# Patient Record
Sex: Female | Born: 1950 | Race: White | Hispanic: No | Marital: Married | Smoking: Never smoker
Health system: Southern US, Community
[De-identification: ages and names within clinical notes are randomized; demographics above are authoritative.]

## PROBLEM LIST (undated history)

## (undated) DIAGNOSIS — F32A Depression, unspecified: Secondary | ICD-10-CM

## (undated) DIAGNOSIS — G4730 Sleep apnea, unspecified: Secondary | ICD-10-CM

## (undated) DIAGNOSIS — E785 Hyperlipidemia, unspecified: Secondary | ICD-10-CM

## (undated) DIAGNOSIS — E119 Type 2 diabetes mellitus without complications: Secondary | ICD-10-CM

## (undated) DIAGNOSIS — C801 Malignant (primary) neoplasm, unspecified: Secondary | ICD-10-CM

## (undated) HISTORY — PX: TRIGGER FINGER RELEASE: SHX641

## (undated) HISTORY — DX: Type 2 diabetes mellitus without complications: E11.9

## (undated) HISTORY — PX: ABDOMINAL HYSTERECTOMY: SHX81

## (undated) HISTORY — DX: Hyperlipidemia, unspecified: E78.5

## (undated) HISTORY — PX: BREAST SURGERY: SHX581

## (undated) HISTORY — DX: Depression, unspecified: F32.A

## (undated) MED FILL — Pertuzumab Soln for IV Infusion 420 MG/14ML (30 MG/ML): INTRAVENOUS | Qty: 14 | Status: AC

---

## 2002-11-03 DIAGNOSIS — C50512 Malignant neoplasm of lower-outer quadrant of left female breast: Secondary | ICD-10-CM | POA: Insufficient documentation

## 2002-11-03 DIAGNOSIS — C50919 Malignant neoplasm of unspecified site of unspecified female breast: Secondary | ICD-10-CM | POA: Insufficient documentation

## 2002-11-03 DIAGNOSIS — Z170 Estrogen receptor positive status [ER+]: Secondary | ICD-10-CM | POA: Insufficient documentation

## 2003-07-18 HISTORY — PX: HYSTERECTOMY ABDOMINAL WITH SALPINGO-OOPHORECTOMY: SHX6792

## 2009-05-13 ENCOUNTER — Emergency Department (HOSPITAL_COMMUNITY): Admission: EM | Admit: 2009-05-13 | Discharge: 2009-05-13 | Payer: Self-pay | Admitting: Emergency Medicine

## 2015-01-24 DIAGNOSIS — E785 Hyperlipidemia, unspecified: Secondary | ICD-10-CM | POA: Insufficient documentation

## 2015-01-24 DIAGNOSIS — Z853 Personal history of malignant neoplasm of breast: Secondary | ICD-10-CM | POA: Insufficient documentation

## 2015-01-24 DIAGNOSIS — R9431 Abnormal electrocardiogram [ECG] [EKG]: Secondary | ICD-10-CM | POA: Insufficient documentation

## 2015-03-01 DIAGNOSIS — I351 Nonrheumatic aortic (valve) insufficiency: Secondary | ICD-10-CM | POA: Insufficient documentation

## 2015-05-23 DIAGNOSIS — Z853 Personal history of malignant neoplasm of breast: Secondary | ICD-10-CM | POA: Diagnosis not present

## 2015-07-19 DIAGNOSIS — H00019 Hordeolum externum unspecified eye, unspecified eyelid: Secondary | ICD-10-CM | POA: Insufficient documentation

## 2015-07-19 DIAGNOSIS — L0390 Cellulitis, unspecified: Secondary | ICD-10-CM | POA: Insufficient documentation

## 2016-10-24 DIAGNOSIS — D122 Benign neoplasm of ascending colon: Secondary | ICD-10-CM | POA: Insufficient documentation

## 2016-11-26 DIAGNOSIS — Z923 Personal history of irradiation: Secondary | ICD-10-CM | POA: Diagnosis not present

## 2016-11-26 DIAGNOSIS — Z9221 Personal history of antineoplastic chemotherapy: Secondary | ICD-10-CM | POA: Diagnosis not present

## 2016-11-26 DIAGNOSIS — Z1509 Genetic susceptibility to other malignant neoplasm: Secondary | ICD-10-CM | POA: Diagnosis not present

## 2016-11-26 DIAGNOSIS — Z1501 Genetic susceptibility to malignant neoplasm of breast: Secondary | ICD-10-CM | POA: Diagnosis not present

## 2016-11-26 DIAGNOSIS — Z853 Personal history of malignant neoplasm of breast: Secondary | ICD-10-CM | POA: Diagnosis not present

## 2017-06-15 DIAGNOSIS — Z853 Personal history of malignant neoplasm of breast: Secondary | ICD-10-CM | POA: Diagnosis not present

## 2017-06-15 DIAGNOSIS — Z9221 Personal history of antineoplastic chemotherapy: Secondary | ICD-10-CM | POA: Diagnosis not present

## 2017-06-15 DIAGNOSIS — Z1501 Genetic susceptibility to malignant neoplasm of breast: Secondary | ICD-10-CM | POA: Diagnosis not present

## 2017-06-15 DIAGNOSIS — Z923 Personal history of irradiation: Secondary | ICD-10-CM | POA: Diagnosis not present

## 2017-06-15 DIAGNOSIS — Z1509 Genetic susceptibility to other malignant neoplasm: Secondary | ICD-10-CM | POA: Diagnosis not present

## 2018-05-05 DIAGNOSIS — E663 Overweight: Secondary | ICD-10-CM | POA: Insufficient documentation

## 2018-05-05 DIAGNOSIS — K5900 Constipation, unspecified: Secondary | ICD-10-CM | POA: Insufficient documentation

## 2018-07-19 DIAGNOSIS — Z853 Personal history of malignant neoplasm of breast: Secondary | ICD-10-CM | POA: Diagnosis not present

## 2018-11-22 DIAGNOSIS — E1129 Type 2 diabetes mellitus with other diabetic kidney complication: Secondary | ICD-10-CM | POA: Insufficient documentation

## 2020-05-16 ENCOUNTER — Other Ambulatory Visit: Payer: Self-pay | Admitting: Hematology and Oncology

## 2020-05-16 ENCOUNTER — Encounter: Payer: Self-pay | Admitting: Oncology

## 2020-05-16 ENCOUNTER — Other Ambulatory Visit: Payer: Self-pay

## 2020-05-16 ENCOUNTER — Telehealth: Payer: Self-pay | Admitting: Oncology

## 2020-05-16 ENCOUNTER — Inpatient Hospital Stay: Payer: Medicare Other

## 2020-05-16 ENCOUNTER — Other Ambulatory Visit: Payer: Self-pay | Admitting: Oncology

## 2020-05-16 ENCOUNTER — Inpatient Hospital Stay: Payer: Medicare Other | Attending: Oncology | Admitting: Oncology

## 2020-05-16 VITALS — BP 148/78 | HR 92 | Temp 98.7°F | Resp 16 | Ht 60.0 in | Wt 145.5 lb

## 2020-05-16 DIAGNOSIS — N6322 Unspecified lump in the left breast, upper inner quadrant: Secondary | ICD-10-CM

## 2020-05-16 DIAGNOSIS — D539 Nutritional anemia, unspecified: Secondary | ICD-10-CM

## 2020-05-16 DIAGNOSIS — C50919 Malignant neoplasm of unspecified site of unspecified female breast: Secondary | ICD-10-CM

## 2020-05-16 DIAGNOSIS — R6889 Other general symptoms and signs: Secondary | ICD-10-CM

## 2020-05-16 DIAGNOSIS — N6320 Unspecified lump in the left breast, unspecified quadrant: Secondary | ICD-10-CM

## 2020-05-16 DIAGNOSIS — D649 Anemia, unspecified: Secondary | ICD-10-CM

## 2020-05-16 LAB — BASIC METABOLIC PANEL
BUN: 21 (ref 4–21)
CO2: 26 — AB (ref 13–22)
Chloride: 105 (ref 99–108)
Creatinine: 0.7 (ref 0.5–1.1)
Glucose: 130
Potassium: 4.2 (ref 3.4–5.3)
Sodium: 139 (ref 137–147)

## 2020-05-16 LAB — CBC AND DIFFERENTIAL
HCT: 35 — AB (ref 36–46)
Hemoglobin: 11.7 — AB (ref 12.0–16.0)
Neutrophils Absolute: 2.52
Platelets: 181 (ref 150–399)
WBC: 4

## 2020-05-16 LAB — COMPREHENSIVE METABOLIC PANEL
Albumin: 4.5 (ref 3.5–5.0)
Calcium: 9.8 (ref 8.7–10.7)

## 2020-05-16 LAB — HEPATIC FUNCTION PANEL
ALT: 26 (ref 7–35)
AST: 30 (ref 13–35)
Alkaline Phosphatase: 73 (ref 25–125)
Bilirubin, Total: 0.5

## 2020-05-16 LAB — CBC: RBC: 3.6 — AB (ref 3.87–5.11)

## 2020-05-16 NOTE — Telephone Encounter (Signed)
PER 12/29 Sched note-Diag mammo/breast u/s sched on 05/30/20@320pm .

## 2020-05-16 NOTE — Progress Notes (Signed)
[facility]  [address] [place],  [place]  [address] [phone]  Clinic Day:  05/16/2020  Referring physician: No ref. 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 Excisional biopsy revealed a 2 cm, grade 2, infiltrating ductal carcinoma in the left lower quadrant.  Estrogen and progesterone receptors were positive and her 2 Neu negative.  Tumor was present at the margins, so she underwent re-excision and axillary dissection.  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  Four of sixteen lymph nodes were positive for metastasis.  All surgical margins were free of tumor involvement.  She received adjuvant chemotherapy with with dose dense Adriamycin and Cytoxan for 4 cycles, followed by dose dense Taxol for 4 cycles.  She received adjuvant radiation of the left breast.  She was placed on anastrozole 1 mg daily in April 2005 and completed 10 years of therapy in April 2015.  She underwent left latissimus dorsi muscle reconstruction of the left breast and right breast reduction.  She underwent total abdominal hysterectomy and bilateral salpingo-oophorectomy prior to anastrozole.  She had excision of a benign mass from the left breast in 2009.  She also underwent testing for BRCA mutations, including BART, in April 2013, which was negative, however, we do not have the results.  She has a history of osteoporosis, previously treated with Zometa every 6 months, which was discontinued when she completed anastrozole.  We began seeing her in July 2014, when she relocated to this area.  Bone density scan in December 2015 was normal.  Her personal  and family history wass suggestive of a hereditary cancer syndrome, so we recommended additional genetic testing with the [employer] myRisk Update Panel test to look for a mutation in other genes known to increase the risk for breast cancer and as well as other cancers.  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  These results were reviewed with her in detail at her February appointment and she was scheduled for an breast MRI which did not reveal any evidence of malignancy.  She is still debating as to whether she would want to have prophylactic bilateral mastectomies.  She was seen by Dr. Coralie Keens who referred her to Dr. Henrietta Hoover regarding reconstructive surgery.  She is interested in reconstruction and the Psychiatric nurse that she saw discouraged her because of the increased risk of complications with surgery on previously irradiated tissue.  The patient so far has continued close surveillance with annual mammogram and breast MRI spaced out 6 months.  Bone density in January 2019 was normal.  She has a breast MRI in August which was clear.  She developed diabetes and we did an MRI of the abdomen which was negative for pancreatic cancer.  She is at increased risk for this because of her ATM genetic mutation.  She did have colonoscopy in early 2020 by Dr. Marcell Anger in [place] and they found 2 polyps.  They therefore recommend a repeat in 5 years.  INTERVAL HISTORY:  Tammy Fowler has been  added onto the schedule today as she could palpate a movable lump within the left breast which arose yesterday.  This is sore to the touch.  Annual bilateral mammogram from April 2021 was clear.  She has not been seen since March 2020 due to the corona virus pandemic.  Blood counts and chemistries are unremarkable except for a hemoglobin of 11.7 and a blood glucose of 130.  I will add iron  studies, B12 and folate to her labs today.  Her  appetite is good, and she has lost 1 pound since her last visit.  She denies fever, chills or other signs of infection.  She denies nausea, vomiting, bowel issues, or abdominal pain.  She denies sore throat, cough, dyspnea, or chest pain.  REVIEW OF SYSTEMS:  Review of Systems  Constitutional: Negative.   HENT:  Negative.   Eyes: Negative.   Respiratory: Negative.   Cardiovascular: Negative.   Gastrointestinal: Negative.   Endocrine: Negative.   Genitourinary: Negative.    Musculoskeletal: Negative.        Left breast mass which acutely developed  Skin: Negative.   Neurological: Negative.   Hematological: Negative.   Psychiatric/Behavioral: Negative.      VITALS:  Blood pressure (!) 148/78, pulse 92, temperature 98.7 F (37.1 C), resp. rate 16, height 5' (1.524 m), weight 145 lb 8 oz (66 kg), SpO2 100 %.  Wt Readings from Last 3 Encounters:  05/16/20 145 lb 8 oz (66 kg)    Body mass index is 28.42 kg/m.  Performance status (ECOG): 1 - Symptomatic but completely ambulatory  PHYSICAL EXAM:  Physical Exam Constitutional:      General: She is not in acute distress.    Appearance: Normal appearance. She is normal weight.  HENT:     Head: Normocephalic and atraumatic.  Eyes:     General: No scleral icterus.    Extraocular Movements: Extraocular movements intact.     Conjunctiva/sclera: Conjunctivae normal.     Pupils: Pupils are equal, round, and reactive to light.  Cardiovascular:     Rate and Rhythm: Normal rate and regular rhythm.     Pulses: Normal pulses.     Heart sounds: Normal heart sounds. No murmur heard. No friction rub. No gallop.   Pulmonary:     Effort: Pulmonary effort is normal. No respiratory distress.     Breath sounds: Normal breath sounds.  Chest:  Breasts:     Right: Normal.      Comments: Right breast is without masses.  Left breast has a well healed scar with a reconstructed area in the left  lateral breast which is firm to palpation.  In the upper inner quadrant of the left breast she has in irregular mass effect measuring about 2 cm with some surrounding firmness.  No adenopathy can be palpated. Abdominal:     General: Bowel sounds are normal. There is no distension.     Palpations: Abdomen is soft. There is no mass.     Tenderness: There is no abdominal tenderness.  Musculoskeletal:        General: Normal range of motion.     Cervical back: Normal range of motion and neck supple.     Right lower leg: No edema.     Left lower leg: No edema.  Lymphadenopathy:     Cervical: No cervical adenopathy.  Skin:    General: Skin is warm and dry.  Neurological:     General: No focal deficit present.  Mental Status: She is alert and oriented to person, place, and time. Mental status is at baseline.  Psychiatric:        Mood and Affect: Mood normal.        Behavior: Behavior normal.        Thought Content: Thought content normal.        Judgment: Judgment normal.     LABS:   CBC Latest Ref Rng & Units 05/16/2020  WBC - 4.0  Hemoglobin 12.0 - 16.0 11.7(A)  Hematocrit 36 - 46 35(A)  Platelets 150 - 399 181   CMP Latest Ref Rng & Units 05/16/2020  BUN 4 - 21 21  Creatinine 0.5 - 1.1 0.7  Sodium 137 - 147 139  Potassium 3.4 - 5.3 4.2  Chloride 99 - 108 105  CO2 13 - 22 26(A)  Calcium 8.7 - 10.7 9.8  Alkaline Phos 25 - 125 73  AST 13 - 35 30  ALT 7 - 35 26     STUDIES:   She underwent digital screening bilateral mammogram with tomography on 08/30/2019 showing: breast density category C.  No mammographic evidence of malignancy.  Allergies:  Allergies  Allergen Reactions  . Oxycodone Itching  . Codeine Other (See Comments)    Other reaction(s): Other (See Comments) Unknown Unknown   . Lisinopril     Other reaction(s): Cough (ALLERGY/intolerance)    Current Medications: Current Outpatient Medications  Medication Sig Dispense Refill  . benzonatate  (TESSALON) 100 MG capsule     . celecoxib (CELEBREX) 100 MG capsule Take by mouth.    . DULoxetine (CYMBALTA) 60 MG capsule Take 1 capsule by mouth daily.    . fluticasone (FLONASE) 50 MCG/ACT nasal spray     . losartan (COZAAR) 50 MG tablet daily.    . metFORMIN (GLUCOPHAGE) 500 MG tablet 2 (two) times daily.    . Omega-3 Fatty Acids (FISH OIL) 1000 MG CAPS Take by mouth.    . rosuvastatin (CRESTOR) 40 MG tablet daily.    . Accu-Chek FastClix Lancets MISC Apply topically.    Marland Kitchen aspirin 81 MG chewable tablet Chew by mouth.    . calcium-vitamin D 250-100 MG-UNIT tablet Take by mouth.    Tammy Fowler Sodium (DSS) 100 MG CAPS Take by mouth.    Marland Kitchen FLUoxetine (PROZAC) 40 MG capsule Take by mouth.    . Multiple Vitamin (MULTIVITAMIN ADULT PO) Take 1 tablet by mouth daily.    Marland Kitchen SUTAB [phone] MG TABS TAKE AS DIRECTED BY PROVIDER FOR COLON PREP     No current facility-administered medications for this visit.     ASSESSMENT & PLAN:   Assessment:   1. Remote history of IIIA left breast cancer, treated with surgery, radiation, adjuvant chemotherapy, and hormonal therapy.  She remains without evidence of disease.  2. 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  She will continue annual breast MRI for increased surveillance for breast cancer.  As she has no family history of pancreatic cancer, routine screening for pancreatic cancer is not recommended at this time.  She does not have any symptoms of pancreatic cancer  3. Osteoporosis by history.  Her most recent bone density scan was normal.  4. New breast mass in the left upper inner quadrant which feels suspicious to me.  We will try to get a diagnostic left mammogram and ultrasound of the area ASAP.  If it is felt appropriate, we will pursue a biopsy.  Plan: She has developed a irregular mass effect measuring about 2 cm within the left breast which is firm to palpation.  This is concerning and so I  recommend pursuing imaging for further evaluation.  Annual mammography from April was clear, so we may want to consider breast MRI imaging, especially as she has an ATM gene mutation.  I have spoken to the radiologist and they recommend diagnostic mammogram and ultrasound of the breast first, so we will schedule this as soon as possible.  If this is indeterminate then we will proceed with breast MRI imaging.  We will also draw a CBC and CMP today.  She understands and agrees with this plan of care.   I provided 25 minutes of face-to-face time during this this encounter and > 50% was spent counseling as documented under my assessment and plan.    [name], MD [facility] AT [facility] [address] [place] [address] Dept: [phone] Dept Fax: [phone]   I, Rita Ohara, am acting as scribe for [name], MD  I have reviewed this report as typed by the medical scribe, and it is complete and accurate.  Tammy Fowler

## 2020-05-17 ENCOUNTER — Telehealth: Payer: Self-pay

## 2020-05-17 NOTE — Telephone Encounter (Signed)
-----   Message from Christine H McCarty, MD sent at 05/16/2020  6:44 PM EST ----- Regarding: call pt Tell her labs look good except for mild anemia, I will check her B12 and iron levels & let her know. BS 130 but that's fine for not fasting.  We will eventually need DEXA but can wait until we resolve the breast problem.    

## 2020-05-22 ENCOUNTER — Telehealth: Payer: Self-pay

## 2020-05-22 NOTE — Telephone Encounter (Signed)
Patient notified of information.  

## 2020-05-22 NOTE — Telephone Encounter (Signed)
-----   Message from Dellia Beckwith, MD sent at 05/16/2020  6:44 PM EST ----- Regarding: call pt Tell her labs look good except for mild anemia, I will check her B12 and iron levels & let her know. BS 130 but that's fine for not fasting.  We will eventually need DEXA but can wait until we resolve the breast problem.

## 2020-05-23 ENCOUNTER — Encounter: Payer: Self-pay | Admitting: Oncology

## 2020-05-29 ENCOUNTER — Encounter: Payer: Self-pay | Admitting: Oncology

## 2020-05-31 ENCOUNTER — Other Ambulatory Visit: Payer: Self-pay | Admitting: Oncology

## 2020-05-31 DIAGNOSIS — R928 Other abnormal and inconclusive findings on diagnostic imaging of breast: Secondary | ICD-10-CM

## 2020-05-31 DIAGNOSIS — N6320 Unspecified lump in the left breast, unspecified quadrant: Secondary | ICD-10-CM

## 2020-06-04 ENCOUNTER — Encounter: Payer: Self-pay | Admitting: Oncology

## 2020-06-04 ENCOUNTER — Telehealth: Payer: Self-pay

## 2020-06-04 DIAGNOSIS — M810 Age-related osteoporosis without current pathological fracture: Secondary | ICD-10-CM | POA: Insufficient documentation

## 2020-06-04 DIAGNOSIS — C50919 Malignant neoplasm of unspecified site of unspecified female breast: Secondary | ICD-10-CM | POA: Insufficient documentation

## 2020-06-04 NOTE — Telephone Encounter (Signed)
Called and informed patient of lab results.  

## 2020-06-04 NOTE — Telephone Encounter (Signed)
-----   Message from [name], MD sent at 06/04/2020  9:53 AM EST ----- Regarding: call pt Tell her iron levels okay but B12 low normal.  I rec. she take 1 pill daily, 500 mcg. I will be looking for her bx this week and hope for good result

## 2020-06-05 ENCOUNTER — Encounter: Payer: Self-pay | Admitting: Oncology

## 2020-06-06 ENCOUNTER — Encounter: Payer: Self-pay | Admitting: Oncology

## 2020-06-07 ENCOUNTER — Telehealth: Payer: Self-pay | Admitting: Oncology

## 2020-06-07 NOTE — Telephone Encounter (Signed)
06/07/20 Spoke with patient and sched next appt  

## 2020-06-07 NOTE — Progress Notes (Signed)
[facility]  [address] [place],  [place]  [address] [phone]  Clinic Day:  06/08/2020  Referring physician: Beckie Busing, FNP   This document serves as a record of services personally performed by Gery Pray, MD. It was created on their behalf by Curry,Lauren E, a trained medical scribe. The creation of this record is based on the scribe's personal observations and the provider's statements to them.   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   HISTORY OF PRESENT ILLNESS:  Tammy Fowler is a 70 y.o. 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 Excisional biopsy revealed a 2 cm, grade 2, infiltrating ductal carcinoma in the left lower quadrant.  Estrogen and progesterone receptors were positive and her 2 Neu negative.  Tumor was present at the margins, so she underwent re-excision and axillary dissection.  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  Four of sixteen lymph nodes were positive for metastasis.  All surgical margins were free of tumor involvement.  She received adjuvant chemotherapy with with dose dense Adriamycin and Cytoxan for 4 cycles, followed by dose dense Taxol for 4 cycles.  She received adjuvant radiation of the left breast.  She was placed on anastrozole 1 mg daily in April 2005 and completed 10 years of therapy in April 2015.  She underwent left latissimus dorsi muscle reconstruction of the left breast and right breast reduction.  She underwent total abdominal hysterectomy and bilateral salpingo-oophorectomy prior to anastrozole.  She had excision of a benign mass from the left breast in 2009.  She also  underwent testing for BRCA mutations, including BART, in April 2013, which was negative, however, we do not have the results.  She has a history of osteoporosis, previously treated with Zometa every 6 months, which was discontinued when she completed anastrozole.  We began seeing her in July 2014, when she relocated to this area.  Bone density scan in December 2015 was normal.  Her personal and family history wass suggestive of a hereditary cancer syndrome, so we recommended additional genetic testing with the Myriad myRisk Update Panel test to look for a mutation in other genes known to increase the risk for breast cancer and as well as other cancers.  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  These results were reviewed with her in detail at her February appointment and she was scheduled for an breast MRI which did not reveal any evidence of malignancy.  She is still debating as to whether she would want to have prophylactic bilateral mastectomies.  She was seen by Dr. Vinson Moselle who referred her to Dr. Kandice Hams regarding reconstructive surgery.  She is interested in reconstruction and the Engineer, petroleum that she saw discouraged her because of the increased risk of complications with surgery on previously irradiated tissue.  The patient so far has continued close surveillance with annual mammogram and breast MRI spaced out 6 months.  Bone density in January 2019 was normal.  She has a breast MRI in August which was clear.  She  developed diabetes and we did an MRI of the abdomen which was negative for pancreatic cancer.  She is at increased risk for this because of her ATM genetic mutation.  She did have colonoscopy in early 2020 by Dr. Marcell Anger in [place] and they found 2 polyps.  They therefore recommend a repeat in 5 years.  INTERVAL HISTORY:  Tammy Fowler is here  for follow up after developing a mass of the left breast.  I saw her recently and confirmed this mass.  Diagnostic left mammogram from January 12th revealed a suspicious mass measuring 2.0 cm at 10 o'clock, which accounted for the patients palpable concern.  Diagnostic right mammogram was clear.  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  However, there is no breast tissue in the sample and so this is more likely a recurrence rather than a new breast primary, even though it has been many years.  A breast prognostic profile has been ordered.  Her  appetite is good, and her weight is stable since her last visit.  She denies fever, chills or other signs of infection.  She denies nausea, vomiting, bowel issues, or abdominal pain.  She denies sore throat, cough, dyspnea, or chest pain.  She is accompanied by her husband Tammy Fowler today.  REVIEW OF SYSTEMS:  Review of Systems  Constitutional: Negative.   HENT:  Negative.   Eyes: Negative.   Respiratory: Negative.   Cardiovascular: Negative.   Gastrointestinal: Negative.   Endocrine: Negative.   Genitourinary: Negative.    Musculoskeletal: Negative.   Skin: Negative.   Neurological: Negative.   Hematological: Negative.   Psychiatric/Behavioral: Negative.      VITALS:  Blood pressure 130/71, pulse 80, temperature 98.2 F (36.8 C), temperature source Oral, resp. rate 18, height 5' (1.524 m), weight 145 lb 1.6 oz (65.8 kg), SpO2 98 %.  Wt Readings from Last 3 Encounters:  06/08/20 145 lb 1.6 oz (65.8 kg)  05/16/20 145 lb 8 oz (66 kg)    Body mass index is 28.34 kg/m.  Performance status (ECOG): 0 - Asymptomatic  PHYSICAL EXAM:  Physical Exam Constitutional:      General: She is not in acute distress.    Appearance: Normal appearance. She is normal weight.   HENT:     Head: Normocephalic and atraumatic.  Eyes:     General: No scleral icterus.    Extraocular Movements: Extraocular movements intact.     Conjunctiva/sclera: Conjunctivae normal.     Pupils: Pupils are equal, round, and reactive to light.  Cardiovascular:     Rate and Rhythm: Normal rate and regular rhythm.     Pulses: Normal pulses.     Heart sounds: Normal heart sounds. No murmur heard. No friction rub. No gallop.   Pulmonary:     Effort: Pulmonary effort is normal. No respiratory distress.     Breath sounds: Normal breath sounds.  Chest:     Comments: Biopsy site in the upper outer quadrant of the left breast with steri strips in place and minimal bruising.  I feel a firmness in that area. Abdominal:     General: Bowel sounds are normal. There is no distension.     Palpations: Abdomen is soft. There is no mass.     Tenderness: There is no abdominal tenderness.  Musculoskeletal:        General: Normal range  of motion.     Cervical back: Normal range of motion and neck supple.     Right lower leg: No edema.     Left lower leg: No edema.  Lymphadenopathy:     Cervical: No cervical adenopathy.  Skin:    General: Skin is warm and dry.  Neurological:     General: No focal deficit present.     Mental Status: She is alert and oriented to person, place, and time. Mental status is at baseline.  Psychiatric:        Mood and Affect: Mood normal.        Behavior: Behavior normal.        Thought Content: Thought content normal.        Judgment: Judgment normal.    LABS:   CBC Latest Ref Rng & Units 05/16/2020  WBC - 4.0  Hemoglobin 12.0 - 16.0 11.7(A)  Hematocrit 36 - 46 35(A)  Platelets 150 - 399 181   CMP Latest Ref Rng & Units 05/16/2020  BUN 4 - 21 21  Creatinine 0.5 - 1.1 0.7  Sodium 137 - 147 139  Potassium 3.4 - 5.3 4.2  Chloride 99 - 108 105  CO2 13 - 22 26(A)  Calcium 8.7 - 10.7 9.8  Alkaline Phos 25 - 125 73  AST 13 - 35 30  ALT 7 - 35 26     STUDIES:   She underwent a digital screening bilateral mammogram with tomography on 08/30/2019 showing: breast density category C.  No mammographic evidence of malignancy.  She underwent a digital diagnostic left mammogram and left breast ultrasound on 05/30/2020 showing: breast density category C.  At the palpable site of concern in the left breast at 10 o'clock there is a suspicious mass measuring 2.0 cm.  She underwent a digital diagnostic unilateral right mammogram with tomography on 06/06/2020 showing: breast density category C.  No evidence of malignancy in the right breast.  She underwent an ultrasound guided biopsy of the left breast on 06/06/2020 and surgical pathology from this procedure revealed: 1.  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  -  Histomorphology consistent with breast carcinoma.  -  A breast prognostic profile will be ordered on block IA and separately reported.   Allergies:  Allergies  Allergen Reactions  . Oxycodone Itching  . Codeine Other (See Comments)    Other reaction(s): Other (See Comments) Unknown Unknown   . Lisinopril     Other reaction(s): Cough (ALLERGY/intolerance)    Current Medications: Current Outpatient Medications  Medication Sig Dispense Refill  . vitamin B-12 (CYANOCOBALAMIN) 500 MCG tablet Take 500 mcg by mouth daily.    . Accu-Chek FastClix Lancets MISC Apply topically.    Marland Kitchen aspirin 81 MG chewable tablet Chew by mouth.    . benzonatate (TESSALON) 100 MG capsule     . calcium-vitamin D 250-100 MG-UNIT tablet Take by mouth.    . celecoxib (CELEBREX) 100 MG capsule Take by mouth.    Mariane Baumgarten Sodium (DSS) 100 MG CAPS Take by mouth.    . DULoxetine (CYMBALTA) 60 MG capsule Take 1 capsule by mouth daily.    Marland Kitchen FLUoxetine (PROZAC) 40 MG capsule Take by mouth.    . fluticasone (FLONASE) 50 MCG/ACT nasal spray     .  losartan (COZAAR) 50 MG tablet daily.    . metFORMIN (GLUCOPHAGE) 500 MG tablet 2 (  two) times daily.    . Multiple Vitamin (MULTIVITAMIN ADULT PO) Take 1 tablet by mouth daily.    . Multiple Vitamins-Minerals (THERA-M) TABS Take by mouth.    . Omega-3 Fatty Acids (FISH OIL) 1000 MG CAPS Take by mouth.    . rosuvastatin (CRESTOR) 40 MG tablet daily.    Marland Kitchen SUTAB [phone] MG TABS TAKE AS DIRECTED BY PROVIDER FOR COLON PREP     No current facility-administered medications for this visit.     ASSESSMENT & PLAN:   Assessment:   1. Remote history of IIIA left breast cancer, treated with surgery, radiation, adjuvant chemotherapy, and hormonal therapy.    2. 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    3. Osteoporosis by history.  Her most recent bone density scan was normal.  4. New breast mass in the left upper inner quadrant which has now been confirmed as malignant as of January 2022.  However, this appears to be arising in the reconstructed adipose tissue rather than in the breast, and so it is more likely a recurrence rather than a second breast primary.  There really is no way to prove this and the slides from her prior cancer are no longer available.  Breast prognostic profile is pending.  We will plan to obtain PET imaging in order to rule out metastasis.  She will need to undergo surgical consultation and I have recommended bilateral mastectomies in view of her genetic predisposition.  She is willing to accept that now, so we will make the appropriate referral.  She is also interested in reconstruction and we will plan for her to see a plastic surgeon as well.  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  This likely represents a local recurrence rather than a secondary primary, although this interval is many years.  We will pursue imaging to confirm that there is no evidence of metastasis, and  I recommend PET imaging.  As she does have the ATM mutation and is considered high risk, I would have her consider bilateral mastectomies.  We will refer her to a surgeon, Dr. [name], for consultation for further discussion.  We will plan to see her back after her procedure to review the final pathology and determine a treatment plan.  Hopefully this is also hormone receptor positive, but the breast prognostic profile is still pending.  If this does represent recurrence, I would also recommend a CDK-4/6 inhibitor such as Ibrance in order to optimize the response to hormonal therapy.  She understands and agrees with this plan of care.  I have answered her questions and she knows to call with any concerns.     I provided 25 minute of face-to-face time during this this encounter and > 50% was spent counseling as documented under my assessment and plan.    [name], MD [facility] [address] [place] [address] Dept: [phone] Dept Fax: [phone]   I, Rita Ohara, am acting as scribe for [name], MD  I have reviewed this report as typed by the medical scribe, and it is complete and accurate.

## 2020-06-08 ENCOUNTER — Encounter: Payer: Self-pay | Admitting: Oncology

## 2020-06-08 ENCOUNTER — Inpatient Hospital Stay: Payer: Medicare Other | Attending: Oncology | Admitting: Oncology

## 2020-06-08 ENCOUNTER — Other Ambulatory Visit: Payer: Self-pay | Admitting: Oncology

## 2020-06-08 ENCOUNTER — Other Ambulatory Visit: Payer: Self-pay

## 2020-06-08 ENCOUNTER — Telehealth: Payer: Self-pay

## 2020-06-08 VITALS — BP 130/71 | HR 80 | Temp 98.2°F | Resp 18 | Ht 60.0 in | Wt 145.1 lb

## 2020-06-08 DIAGNOSIS — C50912 Malignant neoplasm of unspecified site of left female breast: Secondary | ICD-10-CM | POA: Diagnosis not present

## 2020-06-08 DIAGNOSIS — C50911 Malignant neoplasm of unspecified site of right female breast: Secondary | ICD-10-CM

## 2020-06-08 DIAGNOSIS — Z170 Estrogen receptor positive status [ER+]: Secondary | ICD-10-CM | POA: Diagnosis not present

## 2020-06-08 NOTE — Telephone Encounter (Signed)
-----  Message from [name], MD sent at 06/08/2020 11:46 AM EST ----- Regarding: referral Pls ref to Dr. [name] with [facility] for recurrent breast cancer in pt with ATM mutation, I rec bilateral mast and she is interested in immed reconstruction.  Can he make rec for plastic surgeon?

## 2020-06-08 NOTE — Telephone Encounter (Signed)
Referral sent at 1330

## 2020-06-11 ENCOUNTER — Telehealth: Payer: Self-pay | Admitting: Oncology

## 2020-06-11 NOTE — Telephone Encounter (Signed)
06/11/20 sPOKE WITH PATIENT AND GAVE INFO ON PET SCAN

## 2020-06-15 ENCOUNTER — Other Ambulatory Visit: Payer: Self-pay | Admitting: General Surgery

## 2020-06-16 ENCOUNTER — Encounter: Payer: Self-pay | Admitting: Oncology

## 2020-06-18 ENCOUNTER — Other Ambulatory Visit: Payer: Self-pay | Admitting: General Surgery

## 2020-06-18 DIAGNOSIS — C50412 Malignant neoplasm of upper-outer quadrant of left female breast: Secondary | ICD-10-CM

## 2020-06-18 NOTE — Progress Notes (Signed)
[facility]  [address] [place],  [place]  [address] [phone]  Clinic Day:  06/20/2020  Referring physician: Delphina Cahill, FNP   This document serves as a record of services personally performed by [name], MD. It was created on their behalf by Curry,Lauren E, a trained medical scribe. The creation of this record is based on the scribe's personal observations and the provider's statements to them.   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   HISTORY OF PRESENT ILLNESS:  Tammy Fowler is a 70 y.o. 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 Excisional biopsy revealed a 2 cm, grade 2, infiltrating ductal carcinoma in the left lower quadrant.  Estrogen and progesterone receptors were positive and her 2 Neu negative.  Tumor was present at the margins, so she underwent re-excision and axillary dissection.  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  Four of sixteen lymph nodes were positive for metastasis.  All surgical margins were free of tumor involvement.  She received adjuvant chemotherapy with with dose dense Adriamycin and Cytoxan for 4 cycles, followed by dose dense Taxol for 4 cycles.  She received adjuvant radiation of the left breast.  She was placed on anastrozole 1 mg daily in April 2005 and completed 10 years of therapy in April 2015.  She underwent left latissimus dorsi muscle reconstruction of the left breast and right breast reduction.  She underwent total abdominal hysterectomy and bilateral salpingo-oophorectomy prior to anastrozole.  She had excision of a benign mass from the left breast in 2009.  She also  underwent testing for BRCA mutations, including BART, in April 2013, which was negative, however, we do not have the results.  She has a history of osteoporosis, previously treated with Zometa every 6 months, which was discontinued when she completed anastrozole.  We began seeing her in July 2014, when she relocated to this area.  Bone density scan in December 2015 was normal.  Her personal and family history wass suggestive of a hereditary cancer syndrome, so we recommended additional genetic testing with the [employer] myRisk Update Panel test to look for a mutation in other genes known to increase the risk for breast cancer and as well as other cancers.  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  These results were reviewed with her in detail at her February appointment and she was scheduled for an breast MRI which did not reveal any evidence of malignancy.  She is still debating as to whether she would want to have prophylactic bilateral mastectomies.  She was seen by Dr. Coralie Keens who referred her to Dr. Henrietta Hoover regarding reconstructive surgery.  She is interested in reconstruction and the Psychiatric nurse that she saw discouraged her because of the increased risk of complications with surgery on previously irradiated tissue.  The patient so far has continued close surveillance with annual mammogram and breast MRI spaced out 6 months.  Bone density in January 2019 was normal.  She has a breast MRI in August which was clear.  She  developed diabetes and we did an MRI of the abdomen which was negative for pancreatic cancer.  She is at increased risk for this because of her ATM genetic mutation.  She did have colonoscopy in early 2020 by Dr. Marcell Anger in [place] and they found 2 polyps.  They therefore recommend a repeat in 5 years.  She was seen in January of 2022  with a new mass.  Diagnostic left mammogram from January 12th revealed a suspicious mass measuring 2.0 cm at 10 o'clock, which accounted for the patients palpable concern.  Diagnostic right mammogram was clear.  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  HER2 positive.  Estrogen receptor was 30% positive and progesterone receptor was negative.  Ki67 was 25%.  However, there is no breast tissue in the sample and so this is more likely a recurrence rather than a new breast primary, even though it has been many years.  Her previous cancer was HER2 negative and ER positive.    INTERVAL HISTORY:  She is here for routine follow up to finalize a treatment plan and discuss chemotherapy.  PET from January 28th revealed mild FDG uptake associated with the left breast lesion, without signs of avid FDG metastasis or distant metastatic disease.  She has met with Dr. [name], and he has recommended neoadjuvant chemotherapy prior to surgery.  She states that she is doing well and is scheduled with Dr. Claudia Desanctis, plastic surgeon, tomorrow.  Her  appetite is good, and she has gained 1 pound since her last visit.  She denies fever, chills or other signs of infection.  She denies nausea, vomiting, bowel issues, or abdominal pain.  She denies sore throat, cough, dyspnea, or chest pain.  REVIEW OF SYSTEMS:  Review of Systems  Constitutional: Negative.   HENT:  Negative.   Eyes: Negative.   Respiratory: Negative.   Cardiovascular: Negative.   Gastrointestinal: Negative.   Endocrine: Negative.   Genitourinary: Negative.    Musculoskeletal: Negative.   Skin: Negative.   Neurological: Negative.   Hematological: Negative.   Psychiatric/Behavioral: Negative.      VITALS:  Blood pressure (!) 142/64, pulse (!) 108, temperature  98.4 F (36.9 C), temperature source Oral, resp. rate 18, height 5' (1.524 m), weight 146 lb 11.2 oz (66.5 kg), SpO2 97 %.  Wt Readings from Last 3 Encounters:  06/20/20 146 lb 11.2 oz (66.5 kg)  06/08/20 145 lb 1.6 oz (65.8 kg)  05/16/20 145 lb 8 oz (66 kg)    Body mass index is 28.65 kg/m.  Performance status (ECOG): 0 - Asymptomatic  PHYSICAL EXAM:  Physical Exam Constitutional:      General: She is not in acute distress.    Appearance: Normal appearance. She is normal weight.  HENT:     Head: Normocephalic and atraumatic.  Eyes:     General: No scleral icterus.    Extraocular Movements: Extraocular movements intact.     Conjunctiva/sclera: Conjunctivae normal.     Pupils: Pupils are equal, round, and reactive to light.  Cardiovascular:     Rate and Rhythm: Regular rhythm. Tachycardia present.     Pulses: Normal pulses.     Heart sounds: Normal heart sounds. No murmur heard. No friction rub. No gallop.   Pulmonary:     Effort: Pulmonary effort is normal. No respiratory distress.     Breath  sounds: Normal breath sounds.  Chest:     Comments: Firm mass in the upper inner quadrant of the left breast measuring about 3 cm across.  The scar in the lateral left breast is well healed, axilla is negative.  No masses in the right breast. Abdominal:     General: Bowel sounds are normal. There is no distension.     Palpations: Abdomen is soft. There is no mass.     Tenderness: There is no abdominal tenderness.  Musculoskeletal:        General: Normal range of motion.     Cervical back: Normal range of motion and neck supple.     Right lower leg: No edema.     Left lower leg: No edema.  Lymphadenopathy:     Cervical: No cervical adenopathy.  Skin:    General: Skin is warm and dry.  Neurological:     General: No focal deficit present.     Mental Status: She is alert and oriented to person, place, and time. Mental status is at baseline.  Psychiatric:        Mood and Affect:  Mood normal.        Behavior: Behavior normal.        Thought Content: Thought content normal.        Judgment: Judgment normal.    LABS:   CBC Latest Ref Rng & Units 05/16/2020  WBC - 4.0  Hemoglobin 12.0 - 16.0 11.7(A)  Hematocrit 36 - 46 35(A)  Platelets 150 - 399 181   CMP Latest Ref Rng & Units 05/16/2020  BUN 4 - 21 21  Creatinine 0.5 - 1.1 0.7  Sodium 137 - 147 139  Potassium 3.4 - 5.3 4.2  Chloride 99 - 108 105  CO2 13 - 22 26(A)  Calcium 8.7 - 10.7 9.8  Alkaline Phos 25 - 125 73  AST 13 - 35 30  ALT 7 - 35 26    STUDIES:   She underwent a digital screening bilateral mammogram with tomography on 08/30/2019 showing: breast density category C.  No mammographic evidence of malignancy.  She underwent a digital diagnostic left mammogram and left breast ultrasound on 05/30/2020 showing: breast density category C.  At the palpable site of concern in the left breast at 10 o'clock there is a suspicious mass measuring 2.0 cm.  She underwent a digital diagnostic unilateral right mammogram with tomography on 06/06/2020 showing: breast density category C.  No evidence of malignancy in the right breast.  She underwent an ultrasound guided biopsy of the left breast on 06/06/2020 and surgical pathology from this procedure revealed: 1.  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  -  Histomorphology consistent with breast carcinoma. HER2: Positive  Ratio of BVQ/XIH03 signal: 3.61  Average HER2 copy#/cell: 5.60 ER: Positive 30% PR: Negative 0% Ki67: 25%  She underwent nuclear PET skull base to thigh on 06/15/2020 showing: 1.  Mild FDG uptake is associated with the medial left breast lesion.  No signs of FDG avid nodal metastasis or distant metastatic disease. 2.  Aortic atherosclerosis.  Allergies:  Allergies  Allergen Reactions  . Oxycodone Itching  . Codeine Other  (See Comments)    Other reaction(s): Other (See Comments) Unknown Unknown   . Lisinopril     Other reaction(s): Cough (ALLERGY/intolerance)    Current Medications: Current Outpatient Medications  Medication Sig Dispense Refill  .  Accu-Chek FastClix Lancets MISC Apply topically.    Marland Kitchen aspirin 81 MG chewable tablet Chew by mouth.    . benzonatate (TESSALON) 100 MG capsule     . calcium-vitamin D 250-100 MG-UNIT tablet Take by mouth.    . celecoxib (CELEBREX) 100 MG capsule Take by mouth.    Mariane Baumgarten Sodium (DSS) 100 MG CAPS Take by mouth.    . DULoxetine (CYMBALTA) 60 MG capsule Take 1 capsule by mouth daily.    Marland Kitchen FLUoxetine (PROZAC) 40 MG capsule Take by mouth.    . fluticasone (FLONASE) 50 MCG/ACT nasal spray     . losartan (COZAAR) 50 MG tablet daily.    . metFORMIN (GLUCOPHAGE) 500 MG tablet 2 (two) times daily.    . Multiple Vitamins-Minerals (THERA-M) TABS Take by mouth.    . Omega-3 Fatty Acids (FISH OIL) 1000 MG CAPS Take by mouth.    . rosuvastatin (CRESTOR) 40 MG tablet daily.    Marland Kitchen SUTAB [phone] MG TABS TAKE AS DIRECTED BY PROVIDER FOR COLON PREP    . vitamin B-12 (CYANOCOBALAMIN) 500 MCG tablet Take 500 mcg by mouth daily.     No current facility-administered medications for this visit.     ASSESSMENT & PLAN:   Assessment:   1. Remote history of IIIA left breast cancer, treated with surgery, radiation, adjuvant chemotherapy, and hormonal therapy.    2. 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    3. Osteoporosis by history.  Her most recent bone density scan was normal.  She has had IV Reclast in the past.  4. New breast mass in the left upper inner quadrant which has now been confirmed as malignant as of January 2022.  However, this appears to be arising in the reconstructed adipose tissue rather than in the breast, and so it is more likely a recurrence rather than a second breast primary.  There really is no way  to prove this and the slides from her prior cancer are no longer available.  Breast prognostic profile confirmed HER2 positive and estrogen receptor was positive.   PET imaging is negative for signs of metastasis, so likely this is a stage I or IIA.  She will require treatment with chemotherapy and we will plan for TCHP IV every 3 weeks for a total of 6 doses followed by a year of maintenance Herceptin/Perjeta.     Plan:  Biopsy has confirmed poorly differentiated carcinoma in a background of adipose tissue, and breast prognostic profile confirmed HER2 positivity and so she will require chemotherapy.  PET imaging was negative for nodal or distant metastatic disease, and likely this is a stage I or possibly a IIA.  I recommend TCHP IV every 3 weeks for 6 doses, followed by surgery, then by maintenance Herceptin/Perjeta for a total of 1 year.  We reviewed the schedule and possible toxicities of therapy today, and will schedule her for an education session prior to initiation.  We will obtain a baseline ECHO and repeat this every 3 months to monitor for cardiotoxicity.  We will also schedule baseline labs prior to treatment, likely at her chemo-education session.  She is in agreement.  As she does have the ATM mutation and is considered high risk, I would have her consider bilateral mastectomies.  We have referred her to a surgeon, Dr. [name], and he recommend that she complete neoadjuvant chemotherapy first.  He will be placing her port, and is ordering MRI breast imaging.  She is scheduled with Dr. Claudia Desanctis, plastic surgeon, tomorrow.  Following the completion of systemic therapy, I also recommend hormonal therapy, and we will need to obtain a bone density prior to that.  She understands and agrees with this plan of care.  I have answered her questions and she knows to call with any concerns.     I provided 35 minute of face-to-face time during this this encounter and > 50% was spent counseling as  documented under my assessment and plan.    [name], MD [facility] AT [facility] [address] [place] [address] Dept: [phone] Dept Fax: [phone]   I, Rita Ohara, am acting as scribe for [name], MD  I have reviewed this report as typed by the medical scribe, and it is complete and accurate.

## 2020-06-19 ENCOUNTER — Telehealth: Payer: Self-pay | Admitting: *Deleted

## 2020-06-19 NOTE — Telephone Encounter (Signed)
Per Vida Roller, reschedule patient's appt time to 1:30 pm and stated patient was aware  Appt Time changed

## 2020-06-20 ENCOUNTER — Other Ambulatory Visit: Payer: Self-pay

## 2020-06-20 ENCOUNTER — Other Ambulatory Visit: Payer: Self-pay | Admitting: Oncology

## 2020-06-20 ENCOUNTER — Inpatient Hospital Stay: Payer: Medicare Other | Attending: Oncology | Admitting: Oncology

## 2020-06-20 ENCOUNTER — Telehealth: Payer: Self-pay

## 2020-06-20 ENCOUNTER — Encounter: Payer: Self-pay | Admitting: Oncology

## 2020-06-20 VITALS — BP 142/64 | HR 108 | Temp 98.4°F | Resp 18 | Ht 60.0 in | Wt 146.7 lb

## 2020-06-20 DIAGNOSIS — Z9013 Acquired absence of bilateral breasts and nipples: Secondary | ICD-10-CM | POA: Insufficient documentation

## 2020-06-20 DIAGNOSIS — Z1501 Genetic susceptibility to malignant neoplasm of breast: Secondary | ICD-10-CM | POA: Insufficient documentation

## 2020-06-20 DIAGNOSIS — M810 Age-related osteoporosis without current pathological fracture: Secondary | ICD-10-CM

## 2020-06-20 DIAGNOSIS — Z5111 Encounter for antineoplastic chemotherapy: Secondary | ICD-10-CM | POA: Insufficient documentation

## 2020-06-20 DIAGNOSIS — Z7984 Long term (current) use of oral hypoglycemic drugs: Secondary | ICD-10-CM | POA: Insufficient documentation

## 2020-06-20 DIAGNOSIS — Z7952 Long term (current) use of systemic steroids: Secondary | ICD-10-CM | POA: Insufficient documentation

## 2020-06-20 DIAGNOSIS — Z170 Estrogen receptor positive status [ER+]: Secondary | ICD-10-CM | POA: Insufficient documentation

## 2020-06-20 DIAGNOSIS — R519 Headache, unspecified: Secondary | ICD-10-CM | POA: Insufficient documentation

## 2020-06-20 DIAGNOSIS — Z5112 Encounter for antineoplastic immunotherapy: Secondary | ICD-10-CM | POA: Insufficient documentation

## 2020-06-20 DIAGNOSIS — C50911 Malignant neoplasm of unspecified site of right female breast: Secondary | ICD-10-CM

## 2020-06-20 DIAGNOSIS — C50212 Malignant neoplasm of upper-inner quadrant of left female breast: Secondary | ICD-10-CM | POA: Insufficient documentation

## 2020-06-20 DIAGNOSIS — C773 Secondary and unspecified malignant neoplasm of axilla and upper limb lymph nodes: Secondary | ICD-10-CM | POA: Insufficient documentation

## 2020-06-20 DIAGNOSIS — Z7982 Long term (current) use of aspirin: Secondary | ICD-10-CM | POA: Insufficient documentation

## 2020-06-20 DIAGNOSIS — R197 Diarrhea, unspecified: Secondary | ICD-10-CM | POA: Insufficient documentation

## 2020-06-20 DIAGNOSIS — Z79811 Long term (current) use of aromatase inhibitors: Secondary | ICD-10-CM | POA: Insufficient documentation

## 2020-06-20 DIAGNOSIS — Z1509 Genetic susceptibility to other malignant neoplasm: Secondary | ICD-10-CM | POA: Insufficient documentation

## 2020-06-20 DIAGNOSIS — Z5189 Encounter for other specified aftercare: Secondary | ICD-10-CM | POA: Insufficient documentation

## 2020-06-20 DIAGNOSIS — G629 Polyneuropathy, unspecified: Secondary | ICD-10-CM | POA: Insufficient documentation

## 2020-06-20 DIAGNOSIS — Z79899 Other long term (current) drug therapy: Secondary | ICD-10-CM | POA: Insufficient documentation

## 2020-06-20 NOTE — Telephone Encounter (Signed)
Called office and talked with scheduling. They state they will talk with Dr. Cristal Generous nurse and they will call the patient and notify her when appointment is made.

## 2020-06-20 NOTE — Telephone Encounter (Signed)
-----   Message from [name], MD sent at 06/20/2020  2:38 PM EST ----- Regarding: Dr. Donne Hazel Pls ask him to place port, seeing plastic surgery 2/3 and MRI 2/4

## 2020-06-21 ENCOUNTER — Encounter (HOSPITAL_BASED_OUTPATIENT_CLINIC_OR_DEPARTMENT_OTHER): Payer: Self-pay | Admitting: General Surgery

## 2020-06-21 ENCOUNTER — Ambulatory Visit (INDEPENDENT_AMBULATORY_CARE_PROVIDER_SITE_OTHER): Payer: Medicare Other | Admitting: Plastic Surgery

## 2020-06-21 ENCOUNTER — Encounter: Payer: Self-pay | Admitting: Plastic Surgery

## 2020-06-21 ENCOUNTER — Other Ambulatory Visit: Payer: Self-pay

## 2020-06-21 VITALS — BP 146/77 | HR 78 | Ht 62.0 in | Wt 145.4 lb

## 2020-06-21 DIAGNOSIS — Z170 Estrogen receptor positive status [ER+]: Secondary | ICD-10-CM | POA: Diagnosis not present

## 2020-06-21 DIAGNOSIS — C50912 Malignant neoplasm of unspecified site of left female breast: Secondary | ICD-10-CM

## 2020-06-21 NOTE — Progress Notes (Signed)
Referring Provider Delphina Cahill, [place],  [place] [address]   CC:  Chief Complaint  Patient presents with  . Advice Only      Tammy Fowler is an 70 y.o. female.  HPI: Patient presents to discuss potential reconstruction for upcoming treatment of a new left breast cancer.  She initially had a left breast cancer diagnosed in 2004.  She underwent lumpectomy and radiation for this.  She subsequently had a reconstructive procedure to fill out the volume deficit on the left side using a latissimus flap with a small skin paddle.  She also had a right sided reduction or mastopexy at that time.  She ultimately developed what is either a recurrence or a new primary on the left side.  The pathology is concerning so she is about to undergo neoadjuvant chemotherapy.  She is planning to undergo bilateral mastectomies and is here to discuss what her reconstructive options would be like should she choose to have it.  She also has had an abdominoplasty in the past.  Allergies  Allergen Reactions  . Oxycodone Itching  . Codeine Other (See Comments)    Other reaction(s): Other (See Comments) Unknown Unknown   . Lisinopril     Other reaction(s): Cough (ALLERGY/intolerance)    Outpatient Encounter Medications as of 06/21/2020  Medication Sig  . Accu-Chek FastClix Lancets MISC Apply topically.  Marland Kitchen aspirin 81 MG chewable tablet Chew by mouth.  . benzonatate (TESSALON) 100 MG capsule   . calcium-vitamin D 250-100 MG-UNIT tablet Take by mouth.  . celecoxib (CELEBREX) 100 MG capsule Take by mouth.  Mariane Baumgarten Sodium (DSS) 100 MG CAPS Take by mouth.  . DULoxetine (CYMBALTA) 60 MG capsule Take 1 capsule by mouth daily.  Marland Kitchen FLUoxetine (PROZAC) 40 MG capsule Take by mouth.  . fluticasone (FLONASE) 50 MCG/ACT nasal spray   . losartan (COZAAR) 50 MG tablet daily.  . metFORMIN (GLUCOPHAGE) 500 MG tablet 2 (two) times daily.  . Multiple Vitamins-Minerals (THERA-M) TABS Take by mouth.  .  rosuvastatin (CRESTOR) 40 MG tablet daily.  . vitamin B-12 (CYANOCOBALAMIN) 500 MCG tablet Take 500 mcg by mouth daily.  . [DISCONTINUED] Omega-3 Fatty Acids (FISH OIL) 1000 MG CAPS Take by mouth.  . [DISCONTINUED] SUTAB [phone] MG TABS TAKE AS DIRECTED BY PROVIDER FOR COLON PREP   No facility-administered encounter medications on file as of 06/21/2020.     Past Medical History:  Diagnosis Date  . Cancer ([place])   . Depression   . Diabetes mellitus without complication ([place])   . Hyperlipidemia   . Sleep apnea     Past Surgical History:  Procedure Laterality Date  . ABDOMINAL HYSTERECTOMY    . BREAST SURGERY Left   . HYSTERECTOMY ABDOMINAL WITH SALPINGO-OOPHORECTOMY  07/2003   due to fibroids  . TRIGGER FINGER RELEASE      Family History  Problem Relation Age of Onset  . Breast cancer Mother 67  . Breast cancer Maternal Aunt 25  . Ovarian cancer Maternal Grandmother 59  . Bone cancer Maternal Grandfather        49s  . Cancer Maternal Aunt 39    Social History   Social History Narrative  . Not on file  Denies tobacco use  Review of Systems General: Denies fevers, chills, weight loss CV: Denies chest pain, shortness of breath, palpitations  Physical Exam Vitals with BMI 06/21/2020 06/21/2020 06/20/2020  Height 5\' 2"  5\' 2"  5\' 0"   Weight 145 lbs 6  oz 145 lbs 146 lbs 11 oz  BMI 26.59 32.20 25.42  Systolic 706 - 237  Diastolic 77 - 64  Pulse 78 - 108    General:  No acute distress,  Alert and oriented, Non-Toxic, Normal speech and affect Examination of the right breast shows a Wise pattern type lift with no obvious other scars or masses.  Left breast has a lower outer quadrant skin paddle that is about 10 x 2 cm in size.  The mass is palpable in the superior central area.  There is no other obvious scars.  She is probably around a B or C cup on both sides with minimal ptosis.  Base width is 11.5 cm.  Assessment/Plan Patient presents to discuss her reconstructive options  after being diagnosed with left-sided breast cancer.  She has a history of breast conservation therapy on that side.  Both of her regional flap options coming from the back or abdomen have been compromised due to prior surgery.  The only option that I can provide for her for reconstruction would be tissue expander based reconstruction.  From a reconstructive standpoint she would be a candidate for nipple sparing mastectomy as long as Dr. Donne Hazel believes that is reasonable from a cancer treatment standpoint.  I had a long discussion with her about the process of implant-based reconstruction.  I explained I would place a tissue expander that would likely be superficial to the muscle and covered by acellular dermal matrix.  This expander would gradually be inflated and ultimately switched out to a gel implant at a later surgery.  We discussed the risks of the procedure that include bleeding, infection, damage surrounding structures and need for additional procedures.  I explained she is at a higher risk of complications on the left side due to the prior radiation treatment.  Apart from that risk factor she is a reasonable candidate for immediate reconstruction.  She is going to continue to think about whether or not she wants to have reconstruction while she is getting neoadjuvant chemotherapy.  As it gets closer to time for her to have surgery she'll let us know if that's ultimately what she wants.  All of her questions were answered.  Tammy Fowler 06/21/2020, 5:20 PM

## 2020-06-22 ENCOUNTER — Other Ambulatory Visit (HOSPITAL_COMMUNITY)
Admission: RE | Admit: 2020-06-22 | Discharge: 2020-06-22 | Disposition: A | Discharge status: Home / Self Care | Payer: Medicare Other | Source: Ambulatory Visit | Attending: General Surgery | Admitting: General Surgery

## 2020-06-22 ENCOUNTER — Encounter (HOSPITAL_BASED_OUTPATIENT_CLINIC_OR_DEPARTMENT_OTHER)
Admission: RE | Admit: 2020-06-22 | Discharge: 2020-06-22 | Disposition: A | Discharge status: Home / Self Care | Payer: Medicare Other | Source: Ambulatory Visit | Attending: General Surgery | Admitting: General Surgery

## 2020-06-22 ENCOUNTER — Other Ambulatory Visit: Payer: Self-pay | Admitting: General Surgery

## 2020-06-22 ENCOUNTER — Ambulatory Visit
Admission: RE | Admit: 2020-06-22 | Discharge: 2020-06-22 | Disposition: A | Discharge status: Home / Self Care | Payer: Medicare Other | Source: Ambulatory Visit | Attending: General Surgery | Admitting: General Surgery

## 2020-06-22 DIAGNOSIS — Z01812 Encounter for preprocedural laboratory examination: Secondary | ICD-10-CM | POA: Diagnosis not present

## 2020-06-22 DIAGNOSIS — Z20822 Contact with and (suspected) exposure to covid-19: Secondary | ICD-10-CM | POA: Insufficient documentation

## 2020-06-22 DIAGNOSIS — Z01818 Encounter for other preprocedural examination: Secondary | ICD-10-CM | POA: Insufficient documentation

## 2020-06-22 DIAGNOSIS — C50412 Malignant neoplasm of upper-outer quadrant of left female breast: Secondary | ICD-10-CM

## 2020-06-22 LAB — BASIC METABOLIC PANEL
Anion gap: 10 (ref 5–15)
BUN: 16 mg/dL (ref 8–23)
CO2: 25 mmol/L (ref 22–32)
Calcium: 10.2 mg/dL (ref 8.9–10.3)
Chloride: 103 mmol/L (ref 98–111)
Creatinine, Ser: 0.77 mg/dL (ref 0.44–1.00)
GFR, Estimated: >60 mL/min (ref >60)
Glucose, Bld: 161 mg/dL — ABNORMAL HIGH (ref 70–99)
Potassium: 4.6 mmol/L (ref 3.5–5.1)
Sodium: 138 mmol/L (ref 135–145)

## 2020-06-22 LAB — SARS CORONAVIRUS 2 (TAT 6-24 HRS): SARS Coronavirus 2: NEGATIVE

## 2020-06-22 IMAGING — MR MR BREAST BILAT WO/W CM
8 of 13 series · 32 of 48 positions shown · IV contrast (gadavist)
Comparison: Bilateral breast MRI dated [DATE], bilateral
screening mammogram dated [DATE], left diagnostic mammogram and
left breast ultrasound dated [DATE], left breast
ultrasound-guided core needle biopsy and post clip placement
mammogram dated [DATE], right diagnostic mammogram dated
[DATE] and nuclear medicine PET scan dated [DATE].

CLINICAL DATA: Poorly differentiated breast carcinoma in the 10
o'clock position of the left breast diagnosed at recent
ultrasound-guided core needle biopsy. Previous left lumpectomy,
radiation therapy chemotherapy for breast cancer in [LK]. Family
history of breast cancer in her mother diagnosed at age 80 and aunt
diagnosed at age 60.

LABS:  None obtained on site today.
EXAM:
BILATERAL BREAST MRI WITH AND WITHOUT CONTRAST
TECHNIQUE: Multiplanar, multisequence MR images of both breasts were obtained
prior to and following the intravenous administration of 6 ml of
Gadavist

[Series 2: t2_tirm_tra ipat (a-p) · axial · 3.0mm · 0.70mm/px · 1 of 57 slices shown]
[im 1/57]
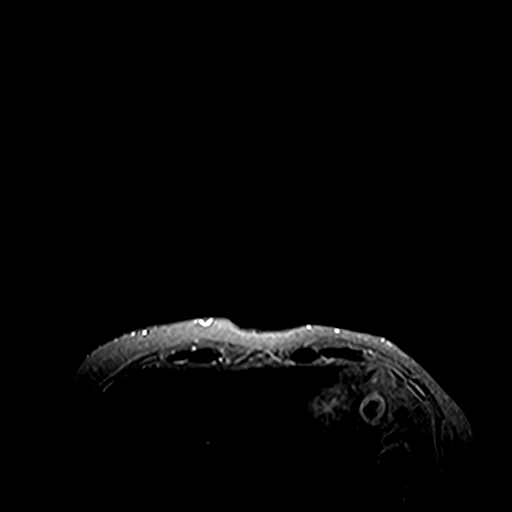

[Series 3: fl3d pre-cm no · axial · non-contrast · 1.2mm · 0.94mm/px · z∈[-54,+117]mm · 5 of 144 slices shown]
[im 1/144]
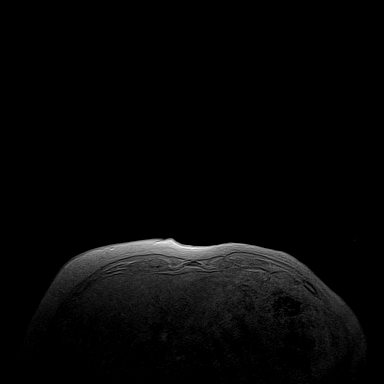
[im 36/144]
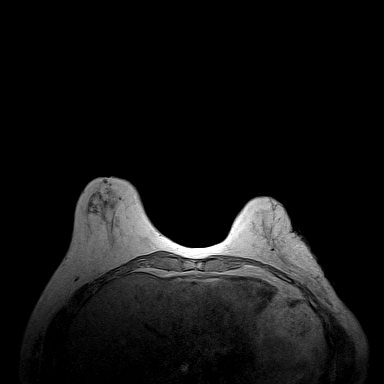
[im 72/144]
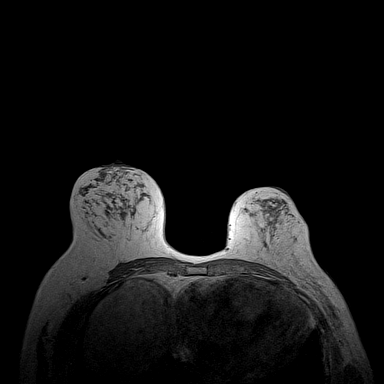
[im 108/144]
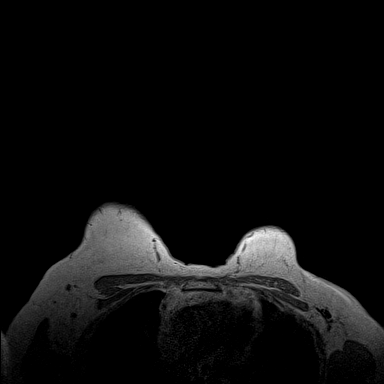
[im 144/144]
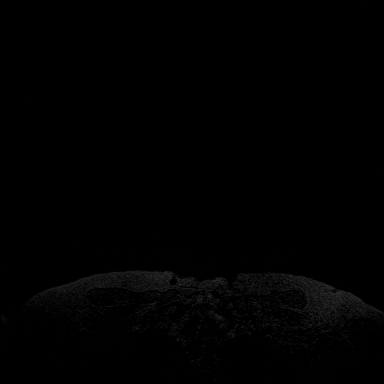

[Series 4: fl3d pre-cm · axial · non-contrast · 1.2mm · 0.94mm/px · z∈[-54,+117]mm · 5 of 144 slices shown]
[im 1/144]
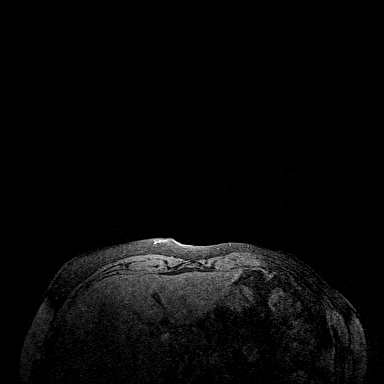
[im 36/144]
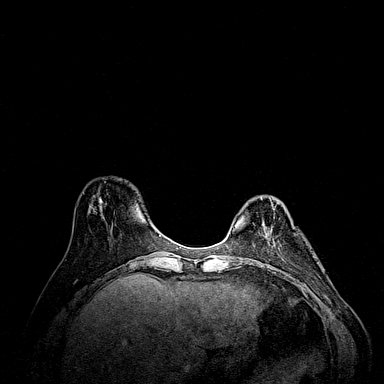
[im 72/144]
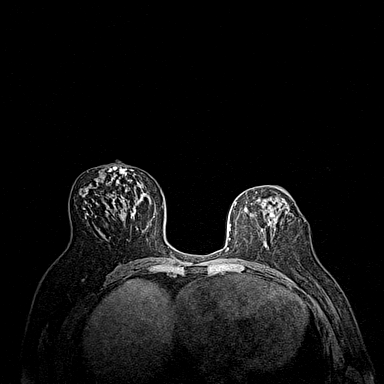
[im 108/144]
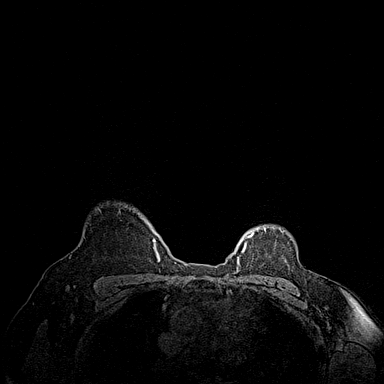
[im 144/144]
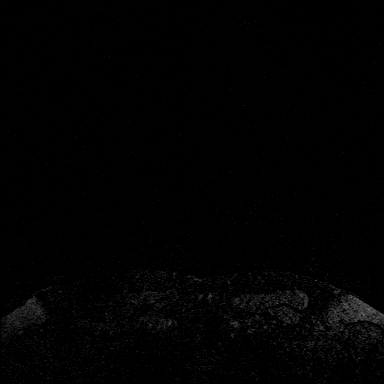

[Series 5: fl3d post-cm 20 · axial · 1.2mm · 0.94mm/px · z∈[-54,+117]mm · 5 of 144 slices shown (1 of 3)]
[im 1/144]
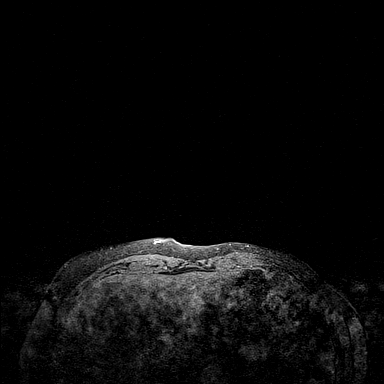
[im 36/144]
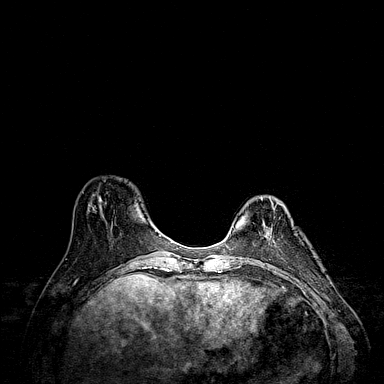
[im 72/144]
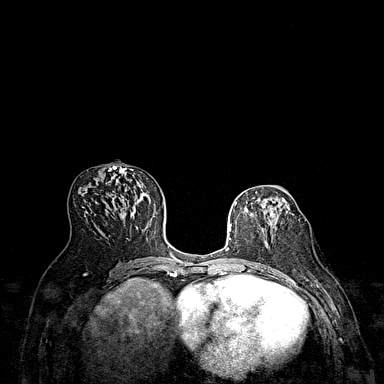
[im 108/144]
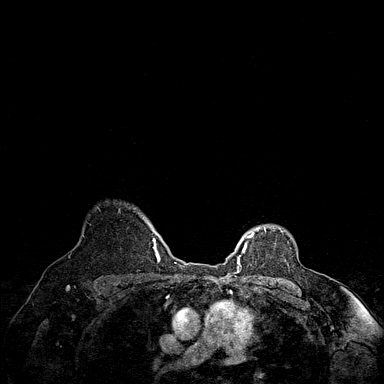
[im 144/144]
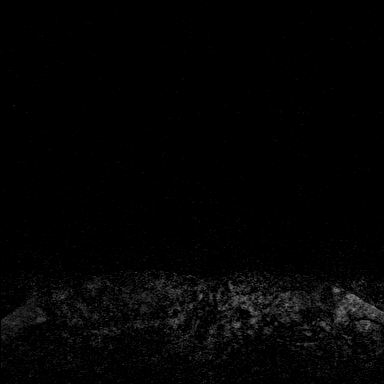

[Series 6: fl3d post-cm 20 · axial · 1.2mm · 0.94mm/px · z∈[-54,+117]mm · 5 of 144 slices shown (2 of 3)]
[im 1/144]
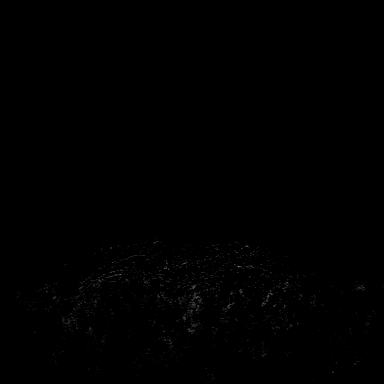
[im 36/144]
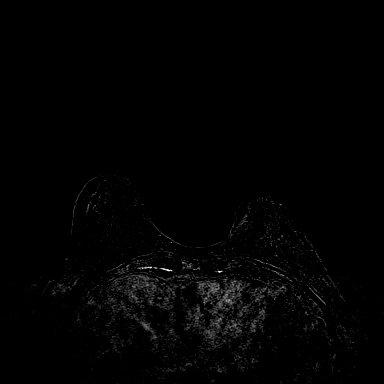
[im 72/144]
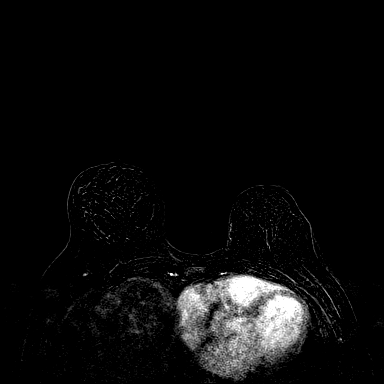
[im 108/144]
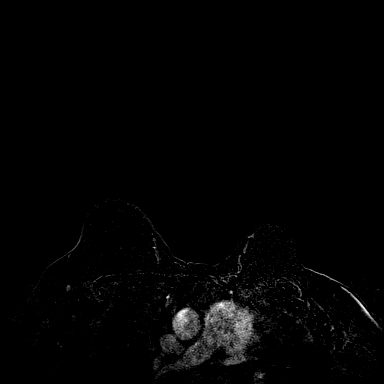
[im 144/144]
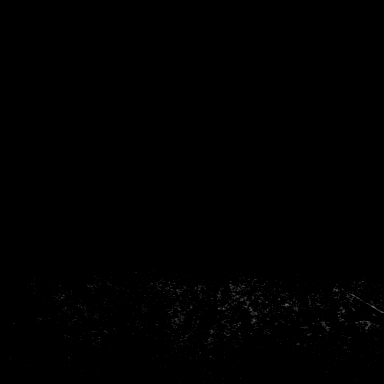

[Series 7: fl3d post-cm 20 · axial · 172.8mm · 0.94mm/px · 1 of 1 slices shown (3 of 3)]
[im 1/1]
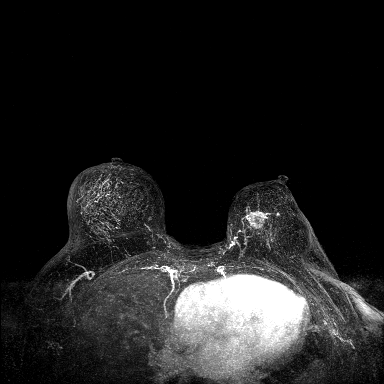

[Series 8: fl3d post-cm 3min · axial · 1.2mm · 0.94mm/px · z∈[-54,+117]mm · 5 of 144 slices shown]
[im 1/144]
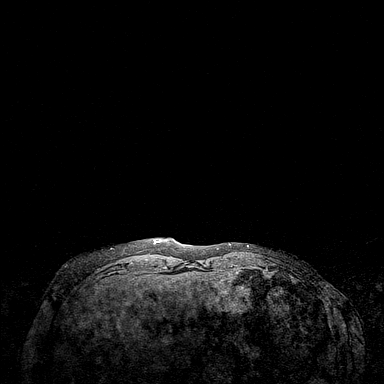
[im 36/144]
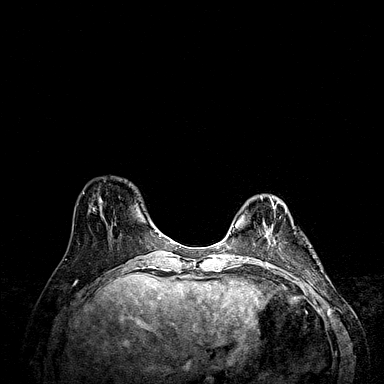
[im 72/144]
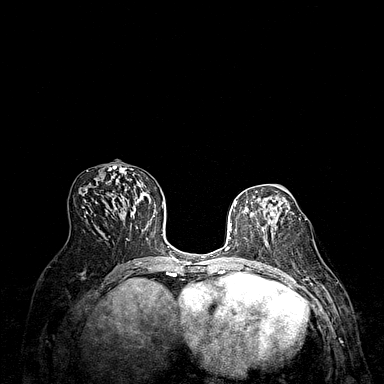
[im 108/144]
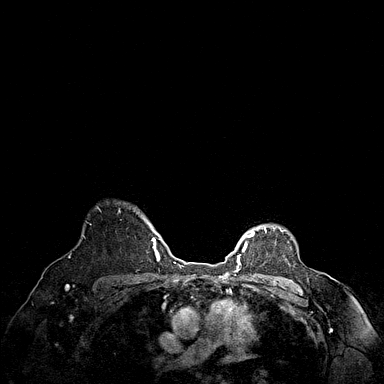
[im 144/144]
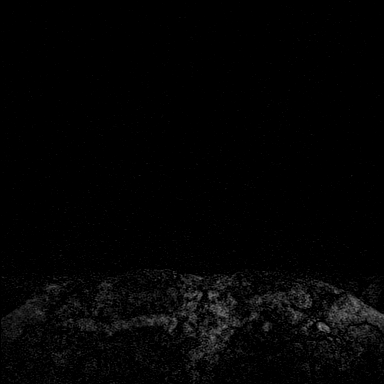

[Series 9: fl3d post-cm 3min_sub · axial · 1.2mm · 0.94mm/px · z∈[-54,+83]mm · 5 of 144 slices shown]
[im 1/144]
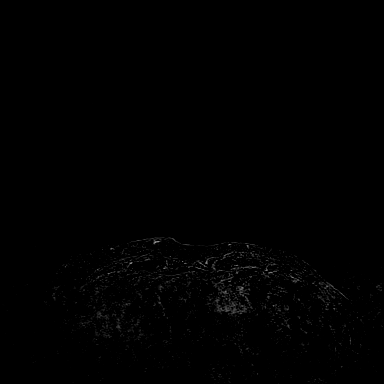
[im 29/144]
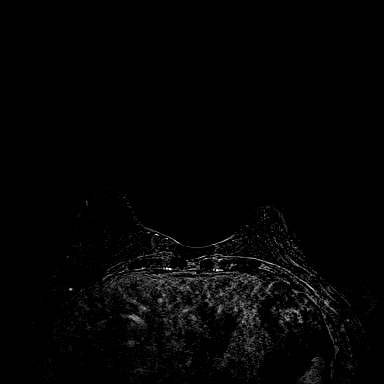
[im 58/144]
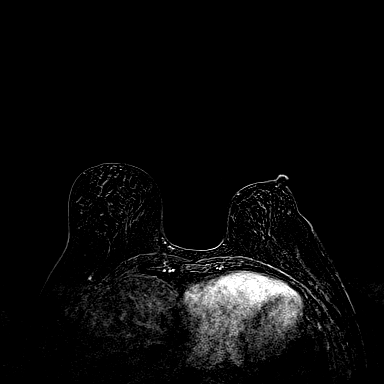
[im 86/144]
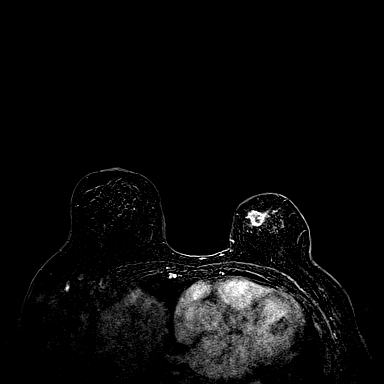
[im 115/144]
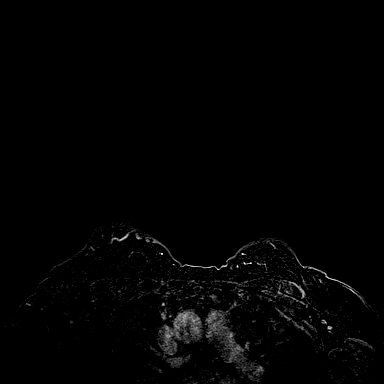

[32 of 48 positions shown; findings below may reference images not displayed]

Three-dimensional MR images were rendered by post-processing of the
original MR data on an independent workstation. The
three-dimensional MR images were interpreted, and findings are
reported in the following complete MRI report for this study. Three
dimensional images were evaluated at the independent interpreting
workstation using the DynaCAD thin client.
FINDINGS: Breast composition: c. Heterogeneous fibroglandular tissue.

Background parenchymal enhancement: Mild.

Right breast: No mass or abnormal enhancement.

Left breast: Irregular mass with thick rim enhancement and lack of
enhancement centrally in the upper inner left breast. This measures
2.3 x 1.7 cm on image number 62 series 6 and 1.7 cm in length in the
sagittal plane. There is a linear extension extending laterally
toward a nearby 4 mm oval enhancing mass in the upper, slightly
outer quadrant. The 2 masses combined span an area measuring 3.2 cm.
These demonstrate persistent and plateau enhancement kinetics.

Post lumpectomy changes are noted in posterior central left breast.

Lymph nodes: No abnormal appearing lymph nodes.

Ancillary findings:  None.
IMPRESSION: 1. 2.3 x 1.7 x 1.7 cm biopsy-proven poorly differentiated breast
carcinoma in the 10 o'clock position of the left breast.
2. 4 mm nearby satellite lesion more laterally in the upper,
slightly outer quadrant of the left breast. Combined, these masses
span 3.2 cm.
3. Post lumpectomy changes in the central left breast posteriorly.
4. No evidence of malignancy on the right and no adenopathy.

RECOMMENDATION:
Treatment plan.

BI-RADS CATEGORY  6: Known biopsy-proven malignancy.

## 2020-06-22 MED ORDER — GADOBUTROL 1 MMOL/ML IV SOLN
6.0000 mL | Freq: Once | INTRAVENOUS | Status: AC | PRN
  Administered 2020-06-22: 6 mL via INTRAVENOUS

## 2020-06-22 MED ORDER — ENSURE PRE-SURGERY PO LIQD: 296.0000 mL | Freq: Once | ORAL | Status: DC

## 2020-06-22 NOTE — Progress Notes (Signed)

## 2020-06-25 ENCOUNTER — Encounter: Payer: Self-pay | Admitting: Oncology

## 2020-06-25 ENCOUNTER — Other Ambulatory Visit: Payer: Self-pay | Admitting: Pharmacist

## 2020-06-25 DIAGNOSIS — C50911 Malignant neoplasm of unspecified site of right female breast: Secondary | ICD-10-CM

## 2020-06-25 NOTE — Progress Notes (Unsigned)
Pt has [employer] MCR primary/TRI-CARE for life secondary; No PA required for ECHO   CPT 93306

## 2020-06-26 ENCOUNTER — Ambulatory Visit (HOSPITAL_BASED_OUTPATIENT_CLINIC_OR_DEPARTMENT_OTHER): Payer: Medicare Other | Admitting: Anesthesiology

## 2020-06-26 ENCOUNTER — Ambulatory Visit (HOSPITAL_COMMUNITY): Payer: Medicare Other

## 2020-06-26 ENCOUNTER — Encounter (HOSPITAL_BASED_OUTPATIENT_CLINIC_OR_DEPARTMENT_OTHER)
Admission: RE | Disposition: A | Discharge status: Home / Self Care | Payer: Self-pay | Source: Home / Self Care | Attending: General Surgery

## 2020-06-26 ENCOUNTER — Ambulatory Visit (HOSPITAL_BASED_OUTPATIENT_CLINIC_OR_DEPARTMENT_OTHER)
Admission: RE | Admit: 2020-06-26 | Discharge: 2020-06-26 | Disposition: A | Discharge status: Home / Self Care | Payer: Medicare Other | Attending: General Surgery | Admitting: General Surgery

## 2020-06-26 ENCOUNTER — Encounter (HOSPITAL_BASED_OUTPATIENT_CLINIC_OR_DEPARTMENT_OTHER): Payer: Self-pay | Admitting: General Surgery

## 2020-06-26 ENCOUNTER — Other Ambulatory Visit: Payer: Self-pay

## 2020-06-26 DIAGNOSIS — C50412 Malignant neoplasm of upper-outer quadrant of left female breast: Secondary | ICD-10-CM | POA: Insufficient documentation

## 2020-06-26 DIAGNOSIS — Z95828 Presence of other vascular implants and grafts: Secondary | ICD-10-CM

## 2020-06-26 HISTORY — PX: PORTACATH PLACEMENT: SHX2246

## 2020-06-26 HISTORY — DX: Malignant (primary) neoplasm, unspecified: C80.1

## 2020-06-26 HISTORY — DX: Sleep apnea, unspecified: G47.30

## 2020-06-26 LAB — GLUCOSE, CAPILLARY
Glucose-Capillary: 144 mg/dL — ABNORMAL HIGH (ref 70–99)
Glucose-Capillary: 146 mg/dL — ABNORMAL HIGH (ref 70–99)

## 2020-06-26 IMAGING — RF DG C-ARM 1-60 MIN
1 series · 1 of 1 positions shown · non-contrast
Comparison: None.

CLINICAL DATA: Placement of a Port-A-Cath.

EXAM:
DG C-ARM 1-60 MIN; CHEST  1 VIEW
FLUOROSCOPY TIME:  Fluoroscopy Time:  20 seconds
Reported radiation: 1.69 mGy.

[Series 1: run · 1 of 1 slices shown]
[im 1/1]
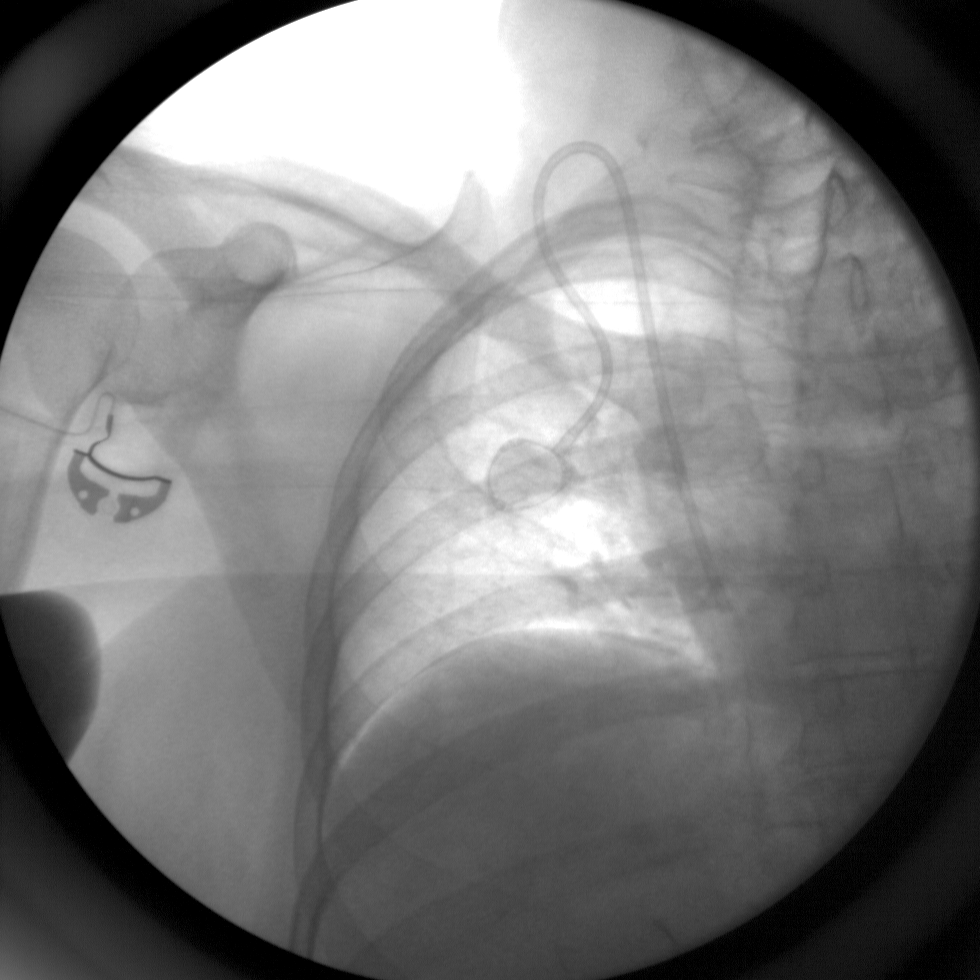

[1 of 1 positions shown; findings below may reference images not displayed]

FINDINGS: Single C-arm fluoroscopic image was obtained intraoperatively and
submitted for post operative interpretation. This image demonstrates
a right IJ Port-A-Cath with the tip projecting in the region of the
right atrium. No evidence of a kink along the catheter. Please see
the performing provider's procedural report for further detail.
IMPRESSION: Intraoperative fluoroscopic image, as detailed above.

## 2020-06-26 IMAGING — RF DG CHEST 1V
1 series · 1 of 1 positions shown · non-contrast
Comparison: None.

CLINICAL DATA: Placement of a Port-A-Cath.

EXAM:
DG C-ARM 1-60 MIN; CHEST  1 VIEW
FLUOROSCOPY TIME:  Fluoroscopy Time:  20 seconds
Reported radiation: 1.69 mGy.

[Series 1: run · 1 of 1 slices shown]
[im 1/1]
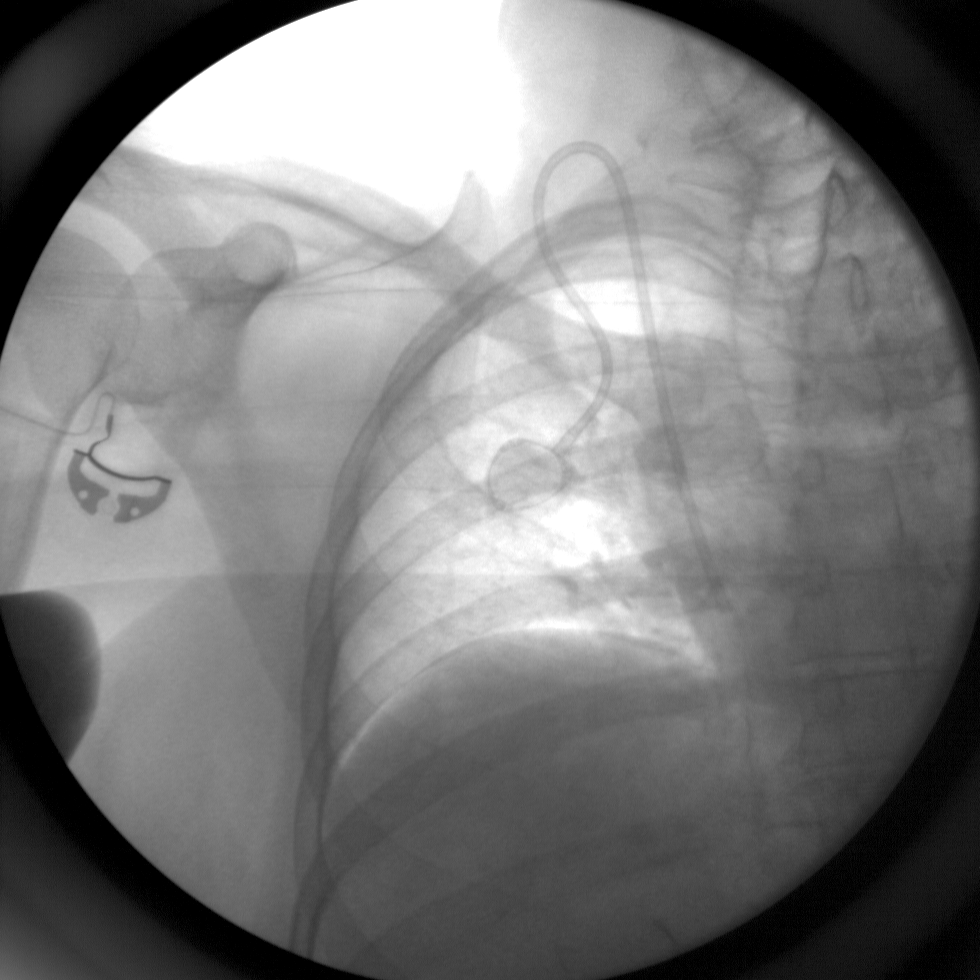

[1 of 1 positions shown; findings below may reference images not displayed]

FINDINGS: Single C-arm fluoroscopic image was obtained intraoperatively and
submitted for post operative interpretation. This image demonstrates
a right IJ Port-A-Cath with the tip projecting in the region of the
right atrium. No evidence of a kink along the catheter. Please see
the performing provider's procedural report for further detail.
IMPRESSION: Intraoperative fluoroscopic image, as detailed above.

## 2020-06-26 SURGERY — INSERTION, TUNNELED CENTRAL VENOUS DEVICE, WITH PORT
Anesthesia: General | Site: Chest | Laterality: Right

## 2020-06-26 MED ORDER — MIDAZOLAM HCL 5 MG/5ML IJ SOLN
INTRAMUSCULAR | Status: DC | PRN
  Administered 2020-06-26: 2 mg via INTRAVENOUS

## 2020-06-26 MED ORDER — BUPIVACAINE HCL (PF) 0.25 % IJ SOLN
INTRAMUSCULAR | Status: AC
  Filled 2020-06-26: qty 30

## 2020-06-26 MED ORDER — TRAMADOL HCL 50 MG PO TABS: 50.0000 mg | ORAL_TABLET | Freq: Four times a day (QID) | ORAL | 0 refills | Status: DC | PRN

## 2020-06-26 MED ORDER — FENTANYL CITRATE (PF) 100 MCG/2ML IJ SOLN: 25.0000 ug | INTRAMUSCULAR | Status: DC | PRN

## 2020-06-26 MED ORDER — PROPOFOL 10 MG/ML IV BOLUS
INTRAVENOUS | Status: DC | PRN
  Administered 2020-06-26: 150 mg via INTRAVENOUS
  Administered 2020-06-26: 30 mg via INTRAVENOUS

## 2020-06-26 MED ORDER — HEPARIN SOD (PORK) LOCK FLUSH 100 UNIT/ML IV SOLN
INTRAVENOUS | Status: AC
  Filled 2020-06-26: qty 5

## 2020-06-26 MED ORDER — HEPARIN (PORCINE) IN NACL 1000-0.9 UT/500ML-% IV SOLN
INTRAVENOUS | Status: AC
  Filled 2020-06-26: qty 500

## 2020-06-26 MED ORDER — PROPOFOL 10 MG/ML IV BOLUS
INTRAVENOUS | Status: AC
  Filled 2020-06-26: qty 20

## 2020-06-26 MED ORDER — CEFAZOLIN SODIUM-DEXTROSE 2-4 GM/100ML-% IV SOLN
2.0000 g | INTRAVENOUS | Status: AC
  Administered 2020-06-26: 2 g via INTRAVENOUS

## 2020-06-26 MED ORDER — DEXAMETHASONE SODIUM PHOSPHATE 10 MG/ML IJ SOLN
INTRAMUSCULAR | Status: DC | PRN
  Administered 2020-06-26: 4 mg via INTRAVENOUS

## 2020-06-26 MED ORDER — ONDANSETRON HCL 4 MG/2ML IJ SOLN
INTRAMUSCULAR | Status: DC | PRN
  Administered 2020-06-26: 4 mg via INTRAVENOUS

## 2020-06-26 MED ORDER — ONDANSETRON HCL 4 MG/2ML IJ SOLN: 4.0000 mg | Freq: Once | INTRAMUSCULAR | Status: DC | PRN

## 2020-06-26 MED ORDER — BUPIVACAINE HCL (PF) 0.25 % IJ SOLN
INTRAMUSCULAR | Status: DC | PRN
  Administered 2020-06-26: 4 mL

## 2020-06-26 MED ORDER — FENTANYL CITRATE (PF) 100 MCG/2ML IJ SOLN
INTRAMUSCULAR | Status: AC
  Filled 2020-06-26: qty 2

## 2020-06-26 MED ORDER — LIDOCAINE HCL (CARDIAC) PF 100 MG/5ML IV SOSY
PREFILLED_SYRINGE | INTRAVENOUS | Status: DC | PRN
  Administered 2020-06-26: 60 mg via INTRATRACHEAL

## 2020-06-26 MED ORDER — ACETAMINOPHEN 500 MG PO TABS
ORAL_TABLET | ORAL | Status: AC
  Filled 2020-06-26: qty 2

## 2020-06-26 MED ORDER — CEFAZOLIN SODIUM-DEXTROSE 2-4 GM/100ML-% IV SOLN
INTRAVENOUS | Status: AC
  Filled 2020-06-26: qty 100

## 2020-06-26 MED ORDER — FENTANYL CITRATE (PF) 100 MCG/2ML IJ SOLN
INTRAMUSCULAR | Status: DC | PRN
  Administered 2020-06-26: 50 ug via INTRAVENOUS
  Administered 2020-06-26 (×2): 25 ug via INTRAVENOUS

## 2020-06-26 MED ORDER — LACTATED RINGERS IV SOLN: INTRAVENOUS | Status: DC

## 2020-06-26 MED ORDER — HEPARIN (PORCINE) IN NACL 2-0.9 UNITS/ML
INTRAMUSCULAR | Status: AC | PRN
  Administered 2020-06-26: 500 mL via INTRAVENOUS

## 2020-06-26 MED ORDER — HEPARIN SOD (PORK) LOCK FLUSH 100 UNIT/ML IV SOLN
INTRAVENOUS | Status: DC | PRN
  Administered 2020-06-26: 500 [IU] via INTRAVENOUS

## 2020-06-26 MED ORDER — ACETAMINOPHEN 500 MG PO TABS
1000.0000 mg | ORAL_TABLET | ORAL | Status: AC
  Administered 2020-06-26: 1000 mg via ORAL

## 2020-06-26 MED ORDER — ONDANSETRON HCL 4 MG/2ML IJ SOLN
INTRAMUSCULAR | Status: AC
  Filled 2020-06-26: qty 2

## 2020-06-26 MED ORDER — MIDAZOLAM HCL 2 MG/2ML IJ SOLN
INTRAMUSCULAR | Status: AC
  Filled 2020-06-26: qty 2

## 2020-06-26 SURGICAL SUPPLY — 55 items
ADH SKN CLS APL DERMABOND .7 (GAUZE/BANDAGES/DRESSINGS) ×1
APL PRP STRL LF DISP 70% ISPRP (MISCELLANEOUS) ×1
APL SKNCLS STERI-STRIP NONHPOA (GAUZE/BANDAGES/DRESSINGS)
BAG DECANTER FOR FLEXI CONT (MISCELLANEOUS) ×2 IMPLANT
BENZOIN TINCTURE PRP APPL 2/3 (GAUZE/BANDAGES/DRESSINGS) IMPLANT
BLADE SURG 11 STRL SS (BLADE) ×2 IMPLANT
BLADE SURG 15 STRL LF DISP TIS (BLADE) ×1 IMPLANT
BLADE SURG 15 STRL SS (BLADE) ×2
CANISTER SUCT 1200ML W/VALVE (MISCELLANEOUS) IMPLANT
CHLORAPREP W/TINT 26 (MISCELLANEOUS) ×2 IMPLANT
COVER BACK TABLE 60X90IN (DRAPES) ×2 IMPLANT
COVER MAYO STAND STRL (DRAPES) ×2 IMPLANT
COVER PROBE 5X48 (MISCELLANEOUS) ×2
COVER WAND RF STERILE (DRAPES) IMPLANT
DECANTER SPIKE VIAL GLASS SM (MISCELLANEOUS) IMPLANT
DERMABOND ADVANCED (GAUZE/BANDAGES/DRESSINGS) ×1
DERMABOND ADVANCED .7 DNX12 (GAUZE/BANDAGES/DRESSINGS) ×1 IMPLANT
DRAPE C-ARM 42X72 X-RAY (DRAPES) ×2 IMPLANT
DRAPE LAPAROSCOPIC ABDOMINAL (DRAPES) ×2 IMPLANT
DRAPE UTILITY XL STRL (DRAPES) ×2 IMPLANT
DRSG TEGADERM 4X4.75 (GAUZE/BANDAGES/DRESSINGS) IMPLANT
ELECT COATED BLADE 2.86 ST (ELECTRODE) ×2 IMPLANT
ELECT REM PT RETURN 9FT ADLT (ELECTROSURGICAL) ×2
ELECTRODE REM PT RTRN 9FT ADLT (ELECTROSURGICAL) ×1 IMPLANT
GAUZE SPONGE 4X4 12PLY STRL LF (GAUZE/BANDAGES/DRESSINGS) ×2 IMPLANT
GLOVE SURG ENC MOIS LTX SZ7 (GLOVE) ×2 IMPLANT
GLOVE SURG SYN 7.5  E (GLOVE) ×1
GLOVE SURG SYN 7.5 E (GLOVE) ×1 IMPLANT
GLOVE SURG UNDER POLY LF SZ7 (GLOVE) ×2 IMPLANT
GLOVE SURG UNDER POLY LF SZ7.5 (GLOVE) ×2 IMPLANT
GOWN STRL REUS W/ TWL LRG LVL3 (GOWN DISPOSABLE) ×2 IMPLANT
GOWN STRL REUS W/ TWL XL LVL3 (GOWN DISPOSABLE) ×1 IMPLANT
GOWN STRL REUS W/TWL LRG LVL3 (GOWN DISPOSABLE) ×4
GOWN STRL REUS W/TWL XL LVL3 (GOWN DISPOSABLE) ×2
IV KIT MINILOC 20X1 SAFETY (NEEDLE) IMPLANT
KIT CVR 48X5XPRB PLUP LF (MISCELLANEOUS) ×1 IMPLANT
KIT PORT POWER 8FR ISP CVUE (Port) ×2 IMPLANT
NDL SAFETY ECLIPSE 18X1.5 (NEEDLE) IMPLANT
NEEDLE HYPO 18GX1.5 SHARP (NEEDLE)
NEEDLE HYPO 25X1 1.5 SAFETY (NEEDLE) ×2 IMPLANT
PACK BASIN DAY SURGERY FS (CUSTOM PROCEDURE TRAY) ×2 IMPLANT
PENCIL SMOKE EVACUATOR (MISCELLANEOUS) ×2 IMPLANT
SLEEVE SCD COMPRESS KNEE MED (MISCELLANEOUS) ×2 IMPLANT
STRIP CLOSURE SKIN 1/2X4 (GAUZE/BANDAGES/DRESSINGS) ×2 IMPLANT
SUT MNCRL AB 4-0 PS2 18 (SUTURE) ×2 IMPLANT
SUT PROLENE 2 0 SH DA (SUTURE) ×2 IMPLANT
SUT SILK 2 0 TIES 17X18 (SUTURE)
SUT SILK 2-0 18XBRD TIE BLK (SUTURE) IMPLANT
SUT VIC AB 3-0 SH 27 (SUTURE) ×2
SUT VIC AB 3-0 SH 27X BRD (SUTURE) ×1 IMPLANT
SYR 5ML LUER SLIP (SYRINGE) ×2 IMPLANT
SYR CONTROL 10ML LL (SYRINGE) ×2 IMPLANT
TOWEL GREEN STERILE FF (TOWEL DISPOSABLE) ×2 IMPLANT
TUBE CONNECTING 20X1/4 (TUBING) IMPLANT
YANKAUER SUCT BULB TIP NO VENT (SUCTIONS) IMPLANT

## 2020-06-26 NOTE — Anesthesia Procedure Notes (Signed)
Procedure Name: LMA Insertion Date/Time: 06/26/2020 9:51 AM Performed by: Glory Buff, CRNA Pre-anesthesia Checklist: Patient identified, Emergency Drugs available, Suction available and Patient being monitored Patient Re-evaluated:Patient Re-evaluated prior to induction Oxygen Delivery Method: Circle system utilized Preoxygenation: Pre-oxygenation with 100% oxygen Induction Type: IV induction LMA: LMA inserted LMA Size: 4.0 Number of attempts: 1 Placement Confirmation: positive ETCO2 Tube secured with: Tape Dental Injury: Teeth and Oropharynx as per pre-operative assessment

## 2020-06-26 NOTE — Op Note (Signed)
Preoperative diagnosis: Her two positive left breast cancer Postoperative diagnosis: Same as above Procedure: Right internal jugular port placement with ultrasound guidance Surgeon: Dr. 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 she did anastrozole for 10 years. she underwent a lift on right and had a left lat flap for a defect latera as well. she has undergone tah/bso. 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 she has been followed as high risk. she has seen someone to discuss bilateral mastectomies. she noted a left breast mass recently. mm shows c density breasts. she has a 2 cm by [place] mass. this is poorly diff ca (breast phenotype) that is er pos at 30, pr neg, her 2 pos and Ki 25%. she was referred by Dr Drema Halon. we have elected to proceed with systemic therapy first.   Procedure: After informed consent was obtainedshe was taken to the OR.She was given antibiotics. SCDs were placed. She was placed under general anesthesia without complication. She was prepped and draped in the standard sterile surgical fashion. A surgical timeout was then performed.  I used the ultrasound to identify the right internal jugular vein. Under ultrasound guidance I then accessed the vein with the needle. I passed the wire. The wire was in the vein both by ultrasound and by fluoroscopy. I thenmade an incisionon her right chest and user her prior pocket.I tunneled the line between the 2 sites. I then placed the dilator over the wire. I observed this with fluoroscopy to go in the correct position. I then removedthe wire. I then passed the  line. The peel-away sheath was removed. I pulled the line back to be in the superior vena cava. I then attached the port. I sutured this into place with 2-0 Prolene. I then closed this with 3-0 Vicryl and 4-0 Monocryl. Glue was placed. Final fluoroscopic image showed the port to be in good position. I then accessed the port and was able to aspirate blood and packed this with heparin.

## 2020-06-26 NOTE — Transfer of Care (Signed)
Immediate Anesthesia Transfer of Care Note  Patient: Tammy Fowler  Procedure(s) Performed: INSERTION PORT-A-CATH (Right Chest)  Patient Location: PACU  Anesthesia Type:General  Level of Consciousness: drowsy, patient cooperative and responds to stimulation  Airway & Oxygen Therapy: Patient Spontanous Breathing and Patient connected to face mask oxygen  Post-op Assessment: Report given to RN and Post -op Vital signs reviewed and stable  Post vital signs: Reviewed and stable  Last Vitals:  Vitals Value Taken Time  BP    Temp    Pulse 86 06/26/20 1042  Resp 13 06/26/20 1042  SpO2 99 % 06/26/20 1042  Vitals shown include unvalidated device data.  Last Pain:  Vitals:   06/26/20 0746  TempSrc: Oral  PainSc: 0-No pain      Patients Stated Pain Goal: 4 (03/13/47 6282)  Complications: No complications documented.

## 2020-06-26 NOTE — Discharge Instructions (Signed)
PORT-A-CATH: POST OP INSTRUCTIONS  Always review your discharge instruction sheet given to you by the facility where your surgery was performed.   1. A prescription for pain medication may be given to you upon discharge. Take your pain medication as prescribed, if needed. If narcotic pain medicine is not needed, then you make take acetaminophen (Tylenol) or ibuprofen (Advil) as needed.  2. Take your usually prescribed medications unless otherwise directed. 3. If you need a refill on your pain medication, please contact our office. All narcotic pain medicine now requires a paper prescription.  Phoned in and fax refills are no longer allowed by law.  Prescriptions will not be filled after 5 pm or on weekends.  4. You should follow a light diet for the remainder of the day after your procedure. 5. Most patients will experience some mild swelling and/or bruising in the area of the incision. It may take several days to resolve. 6. It is common to experience some constipation if taking pain medication after surgery. Increasing fluid intake and taking a stool softener (such as Colace) will usually help or prevent this problem from occurring. A mild laxative (Milk of Magnesia or Miralax) should be taken according to package directions if there are no bowel movements after 48 hours.  7. Unless discharge instructions indicate otherwise, you may remove your bandages 48 hours after surgery, and you may shower at that time. You may have steri-strips (small white skin tapes) in place directly over the incision.  These strips should be left on the skin for 7-10 days.  If your surgeon used Dermabond (skin glue) on the incision, you may shower in 24 hours.  The glue will flake off over the next 2-3 weeks.  8. If your port is left accessed at the end of surgery (needle left in port), the dressing cannot get wet and should only by changed by a healthcare professional. When the port is no longer accessed (when the  needle has been removed), follow step 7.   9. ACTIVITIES:  Limit activity involving your arms for the next 72 hours. Do no strenuous exercise or activity for 1 week. You may drive when you are no longer taking prescription pain medication, you can comfortably wear a seatbelt, and you can maneuver your car. 10.You may need to see your doctor in the office for a follow-up appointment.  Please       check with your doctor.  11.When you receive a new Port-a-Cath, you will get a product guide and        ID card.  Please keep them in case you need them.  WHEN TO CALL YOUR DOCTOR [phone]): 1. Fever over 101.0 2. Chills 3. Continued bleeding from incision 4. Increased redness and tenderness at the site 5. Shortness of breath, difficulty breathing   The clinic staff is available to answer your questions during regular business hours. Please don't hesitate to call and ask to speak to one of the nurses or medical assistants for clinical concerns. If you have a medical emergency, go to the nearest emergency room or call 911.  A surgeon from [facility] is always on call at the hospital.     For further information, please visit [email or web address]  Post Anesthesia Home Care Instructions  Activity: Get plenty of rest for the remainder of the day. A responsible individual must stay with you for 24 hours following the procedure.  For the next 24 hours, DO NOT: -Drive a car -Operate  machinery -Drink alcoholic beverages -Take any medication unless instructed by your physician -Make any legal decisions or sign important papers.  Meals: Start with liquid foods such as gelatin or soup. Progress to regular foods as tolerated. Avoid greasy, spicy, heavy foods. If nausea and/or vomiting occur, drink only clear liquids until the nausea and/or vomiting subsides. Call your physician if vomiting continues.  Special Instructions/Symptoms: Your throat may feel dry or sore from  the anesthesia or the breathing tube placed in your throat during surgery. If this causes discomfort, gargle with warm salt water. The discomfort should disappear within 24 hours.  If you had a scopolamine patch placed behind your ear for the management of post- operative nausea and/or vomiting:  1. The medication in the patch is effective for 72 hours, after which it should be removed.  Wrap patch in a tissue and discard in the trash. Wash hands thoroughly with soap and water. 2. You may remove the patch earlier than 72 hours if you experience unpleasant side effects which may include dry mouth, dizziness or visual disturbances. 3. Avoid touching the patch. Wash your hands with soap and water after contact with the patch.    No Tylenol until 2:00PM.

## 2020-06-26 NOTE — H&P (Signed)
70 yof in 2004 in [place] had a IIIA left breast cancer treated with lumpectomy/alnd (4/16 nodes positive), followed by AC-T and radiotherapy. she did anastrozole for 10 years. she underwent a lift on right and had a left lat flap for a defect latera as well. she has undergone tah/bso. 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 she has been followed as high risk. she has seen someone to discuss bilateral mastectomies. she noted a left breast mass recently. mm shows c density breasts. she has a 2 cm by [place] mass. this is poorly diff ca (breast phenotype) that is er pos at 30, pr neg, her 2 pos and Ki 25%. she was referred by Dr Drema Halon.    Past Surgical History Illene Regulus, CMA; 06/15/2020 11:49 AM) Colon Polyp Removal - Colonoscopy  Hysterectomy (not due to cancer) - Complete   Diagnostic Studies History Lars Mage Spillers, CMA; 06/15/2020 11:49 AM) Colonoscopy  1-5 years ago Mammogram  within last year  Allergies Illene Regulus, CMA; 06/15/2020 10:27 AM) No Known Drug Allergies  [06/15/2020]:  Medication History Illene Regulus, CMA; 06/15/2020 10:29 AM) Losartan Potassium (50MG Tablet, Oral) Active. metFORMIN HCl (500MG Tablet, Oral) Active. DULoxetine HCl (60MG Capsule DR Part, Oral) Active. Vitamin B12 (500MCG Tablet, Oral) Active. CeleBREX (100MG Capsule, Oral) Active. Magnesium (200MG Tablet, Oral) Active. Vitamin D (50 MCG(2000 UT) Tablet, Oral) Active.  Social History Illene Regulus, CMA; 06/15/2020 11:49 AM) Alcohol use  Occasional alcohol use. Caffeine use  Carbonated beverages, Coffee, Tea. No drug use  Tobacco use  Never smoker.  Family History Illene Regulus, [place]; 06/15/2020 11:49 AM) Arthritis  Mother. Breast Cancer  Mother. Heart Disease  Brother, Mother. Hypertension  Mother.  Pregnancy / Birth History Illene Regulus, CMA; 06/15/2020 11:49 AM) Age at menarche  45  years. Age of menopause  60-50 Gravida  2 Maternal age  70-20 Para  2  Other Problems Illene Regulus, CMA; 06/15/2020 11:49 AM) Breast Cancer  Diabetes Mellitus  Hypercholesterolemia  Lump In Breast  Oophorectomy  Left. Sleep Apnea    Review of Systems (Alisha Spillers CMA; 06/15/2020 11:49 AM) General Not Present- Appetite Loss, Chills, Fatigue, Fever, Night Sweats, Weight Gain and Weight Loss. Skin Not Present- Change in Wart/Mole, Dryness, Hives, Jaundice, New Lesions, Non-Healing Wounds, Rash and Ulcer. HEENT Not Present- Earache, Hearing Loss, Hoarseness, Nose Bleed, Oral Ulcers, Ringing in the Ears, Seasonal Allergies, Sinus Pain, Sore Throat, Visual Disturbances, Wears glasses/contact lenses and Yellow Eyes. Breast Present- Breast Mass. Not Present- Breast Pain, Nipple Discharge and Skin Changes. Cardiovascular Not Present- Chest Pain, Difficulty Breathing Lying Down, Leg Cramps, Palpitations, Rapid Heart Rate, Shortness of Breath and Swelling of Extremities. Gastrointestinal Present- Constipation. Not Present- Abdominal Pain, Bloating, Bloody Stool, Change in Bowel Habits, Chronic diarrhea, Difficulty Swallowing, Excessive gas, Gets full quickly at meals, Hemorrhoids, Indigestion, Nausea, Rectal Pain and Vomiting. Female Genitourinary Not Present- Frequency, Nocturia, Painful Urination, Pelvic Pain and Urgency. Musculoskeletal Not Present- Back Pain, Joint Pain, Joint Stiffness, Muscle Pain, Muscle Weakness and Swelling of Extremities. Neurological Present- Decreased Memory. Not Present- Fainting, Headaches, Numbness, Seizures, Tingling, Tremor, Trouble walking and Weakness. Psychiatric Present- Change in Sleep Pattern. Not Present- Anxiety, Bipolar, Depression, Fearful and Frequent crying. Endocrine Present- Hot flashes and New Diabetes. Not Present- Cold Intolerance, Excessive Hunger, Hair Changes and Heat Intolerance. Hematology Not Present- Blood Thinners, Easy  Bruising, Excessive bleeding, Gland problems, HIV and Persistent Infections.  Vitals (Alisha Spillers CMA; 06/15/2020 10:27 AM) 06/15/2020 10:27 AM  Weight: 144 lb Height: 62in Body Surface Area: 1.66 m Body Mass Index: 26.34 kg/m  BP: 132/78(Sitting, Left Arm, Standard) Physical Exam [name] MD; 06/16/2020 4:53 PM) General Mental Status-Alert. Orientation-Oriented X3. Breast Nipples-No Discharge. Note: 12 oclock mass on left with some hematoma now left louq lat flap Lymphatic Head & Neck General Head & Neck Lymphatics: Bilateral - Description - Normal. Axillary General Axillary Region: Bilateral - Description - Normal. Note: no [place] adenopathy  Assessment & Plan [name] MD; 06/16/2020 4:59 PM) BREAST CANCER OF UPPER-OUTER QUADRANT OF LEFT FEMALE BREAST (C50.412) Port placement We discussed staging and pathophysiology of breast cancer as well as all available treatment options today. I recommended with 2 cm her 2 pos tumor primary systemic therapy. we discussed right IJ port placement and I will follow up with Dr Hinton Rao to make sure she is agreeable to plan I think for surgery she will require left mastectomy due to prior bct and with ATM mutation this would be recommended . she would like to consider bilateral and I think this is reasonable. I am going to refer to Dr Claudia Desanctis now to start working on this planning/options so that when done with initial therapy we can move forward quickly.

## 2020-06-26 NOTE — Interval H&P Note (Signed)
History and Physical Interval Note:  06/26/2020 8:29 AM  Tammy Fowler  has presented today for surgery, with the diagnosis of BREAST CANCER.  The various methods of treatment have been discussed with the patient and family. After consideration of risks, benefits and other options for treatment, the patient has consented to  Procedure(s): INSERTION PORT-A-CATH (N/A) as a surgical intervention.  The patient's history has been reviewed, patient examined, no change in status, stable for surgery.  I have reviewed the patient's chart and labs.  Questions were answered to the patient's satisfaction.     [name]

## 2020-06-26 NOTE — Anesthesia Postprocedure Evaluation (Signed)
Anesthesia Post Note  Patient: Tammy Fowler  Procedure(s) Performed: INSERTION PORT-A-CATH (Right Chest)     Patient location during evaluation: PACU Anesthesia Type: General Level of consciousness: awake and alert Pain management: pain level controlled Vital Signs Assessment: post-procedure vital signs reviewed and stable Respiratory status: spontaneous breathing, nonlabored ventilation and respiratory function stable Cardiovascular status: blood pressure returned to baseline and stable Postop Assessment: no apparent nausea or vomiting Anesthetic complications: no   No complications documented.  Last Vitals:  Vitals:   06/26/20 1100 06/26/20 1120  BP: 125/73 126/78  Pulse: 81 70  Resp: 14 16  Temp:  (!) 36.1 C  SpO2: 97% 95%    Last Pain:  Vitals:   06/26/20 1120  TempSrc:   PainSc: 0-No pain                 [name]

## 2020-06-26 NOTE — Anesthesia Preprocedure Evaluation (Addendum)
Anesthesia Evaluation  Patient identified by MRN, date of birth, ID band Patient awake    Reviewed: Allergy & Precautions, NPO status , Patient's Chart, lab work & pertinent test results  History of Anesthesia Complications Negative for: history of anesthetic complications  Airway Mallampati: I  TM Distance: >3 FB Neck ROM: Full    Dental  (+) Dental Advisory Given, Teeth Intact, Caps   Pulmonary sleep apnea ,    Pulmonary exam normal        Cardiovascular hypertension, Pt. on medications Normal cardiovascular exam     Neuro/Psych PSYCHIATRIC DISORDERS Depression negative neurological ROS     GI/Hepatic negative GI ROS, Neg liver ROS,   Endo/Other  diabetes, Type 2, Oral Hypoglycemic Agents  Renal/GU negative Renal ROS     Musculoskeletal negative musculoskeletal ROS (+)   Abdominal   Peds  Hematology negative hematology ROS (+)   Anesthesia Other Findings Covid test negative   Reproductive/Obstetrics  Breast cancer                            Anesthesia Physical Anesthesia Plan  ASA: II  Anesthesia Plan: General   Post-op Pain Management:    Induction: Intravenous  PONV Risk Score and Plan: 3 and Treatment may vary due to age or medical condition, Ondansetron and Dexamethasone  Airway Management Planned: LMA  Additional Equipment: None  Intra-op Plan:   Post-operative Plan: Extubation in OR  Informed Consent: I have reviewed the patients History and Physical, chart, labs and discussed the procedure including the risks, benefits and alternatives for the proposed anesthesia with the patient or authorized representative who has indicated his/her understanding and acceptance.     Dental advisory given  Plan Discussed with: CRNA and Anesthesiologist  Anesthesia Plan Comments:        Anesthesia Quick Evaluation

## 2020-06-27 ENCOUNTER — Other Ambulatory Visit: Payer: Self-pay | Admitting: Oncology

## 2020-06-27 ENCOUNTER — Encounter: Payer: Self-pay | Admitting: Oncology

## 2020-06-27 ENCOUNTER — Encounter (HOSPITAL_BASED_OUTPATIENT_CLINIC_OR_DEPARTMENT_OTHER): Payer: Self-pay | Admitting: General Surgery

## 2020-06-27 DIAGNOSIS — C50911 Malignant neoplasm of unspecified site of right female breast: Secondary | ICD-10-CM

## 2020-06-27 DIAGNOSIS — I361 Nonrheumatic tricuspid (valve) insufficiency: Secondary | ICD-10-CM | POA: Diagnosis not present

## 2020-06-27 DIAGNOSIS — I351 Nonrheumatic aortic (valve) insufficiency: Secondary | ICD-10-CM | POA: Diagnosis not present

## 2020-06-27 NOTE — Progress Notes (Signed)
Face to face contact with pt in [place] at [place]. Assisted with wig. Pt inquired about the Look Good Feel Better Program. Called pt back after face to face visit to let her know that Look Good Feel Better is not being done in person but she can register online for a virtual session. Patient was also inquiring about reflexology to help combat neuropathy. After inquiring and research, I notified the patient the reflexology is not offered in a local medical facility. I informed her that certain massage businesses offer reflexology and gave her an example of a local provider.

## 2020-06-27 NOTE — Progress Notes (Signed)
START ON PATHWAY REGIMEN - Breast     A cycle is every 21 days:     Pertuzumab      Pertuzumab      Trastuzumab-xxxx      Trastuzumab-xxxx      Carboplatin      Docetaxel   **Always confirm dose/schedule in your pharmacy ordering system**  Patient Characteristics: Preoperative or Nonsurgical Candidate (Clinical Staging), Neoadjuvant Therapy followed by Surgery, Invasive Disease, Chemotherapy, HER2 Positive, ER Positive Therapeutic Status: Preoperative or Nonsurgical Candidate (Clinical Staging) AJCC M Category: cM0 AJCC Grade: G3 Breast Surgical Plan: Neoadjuvant Therapy followed by Surgery ER Status: Positive (+) AJCC 8 Stage Grouping: IIA HER2 Status: Positive (+) AJCC T Category: cT2 AJCC N Category: cN0 PR Status: Negative (-) Intent of Therapy: Curative Intent, Discussed with Patient 

## 2020-06-28 MED ORDER — LIDOCAINE-PRILOCAINE 2.5-2.5 % EX CREA: TOPICAL_CREAM | CUTANEOUS | 3 refills | Status: DC

## 2020-06-28 MED ORDER — DEXAMETHASONE 4 MG PO TABS: 8.0000 mg | ORAL_TABLET | Freq: Two times a day (BID) | ORAL | 1 refills | Status: DC

## 2020-06-28 MED ORDER — PROCHLORPERAZINE MALEATE 10 MG PO TABS: 10.0000 mg | ORAL_TABLET | Freq: Four times a day (QID) | ORAL | 1 refills | Status: DC | PRN

## 2020-06-28 MED ORDER — ONDANSETRON HCL 8 MG PO TABS: 8.0000 mg | ORAL_TABLET | Freq: Two times a day (BID) | ORAL | 1 refills | Status: DC | PRN

## 2020-06-28 NOTE — Progress Notes (Signed)
The patient is a with local recurrence of right breast cancer.  Patient presents to clinic today with her husband for chemotherapy education and palliative care consult.  We will start Docetaxel, carboplatin, trastuzumab and pertuzumab IV every 3 weeks.  We will send in prescriptions for decadron, prochlorperazine and ondansetron.  The patient verbalizes understanding of and agreement to the plan as discussed today.  Provided general information including the following: 1.  Date of education: 06-29-2020 2.  Physician name: Dr. Hinton Rao 3.  Diagnosis: Recurrence right breast cancer 4.  Stage: stage IIA (rcT2 cN0 cM0 G3, ER+, PR- HER2+) 5.  Curative  6.  Chemotherapy plan including drugs and how often: docetaxel, carboplatin, trastuzumab, pertuzumab IV every 3 weeks 7.  Start date: 07-03-2020 8.  Other referrals: No other referrals at this time 9.  The patient is to call our office with any questions or concerns.  Our office number [phone], if after hours or on the weekend, call the same number and wait for the answering service.  There is always an oncologist on call 10.  Medications prescribed: ondansetron, prochlorperazine, decadron 11.  The patient has verbalized understanding of the treatment plan and has no barriers to adherence or understanding.  Obtained signed consent from patient.  Discussed symptoms including 1.  Low blood counts including red blood cells, white blood cells and platelets. 2. Infection including to avoid large crowds, wash hands frequently, and stay away from people who were sick.  If fever develops of 100.4 or higher, call our office. 3.  Mucositis-given instructions on mouth rinse (baking soda and salt mixture).  Keep mouth clean.  Use soft bristle toothbrush.  If mouth sores develop, call our clinic. 4.  Nausea/vomiting-gave prescriptions for ondansetron 4 mg every 4 hours as needed for nausea, may take around the clock if persistent.  Compazine 10 mg every 6  hours, may take around the clock if persistent. 5.  Diarrhea-use over-the-counter Imodium.  Call clinic if not controlled. 6.  Constipation-use senna, 1 to 2 tablets twice a day.  If no BM in 2 to 3 days call the clinic. 7.  Loss of appetite-try to eat small meals every 2-3 hours.  Call clinic if not eating. 8.  Taste changes-zinc 500 mg daily.  If becomes severe call clinic. 9.  Alcoholic beverages. 10.  Drink 2 to 3 quarts of water per day. 11.  Peripheral neuropathy-patient to call if numbness or tingling in hands or feet is persistent    Gave information on the supportive care team and how to contact them regarding services.  Discussed advanced directives.  The patient does not have their advanced directives but will look at the copy provided in their notebook and will call with any questions. [employer] Social worker Advanced directives  Answered questions to patient satisfaction.  Patient is to call with any further questions or concerns.  Time spent on this palliative care/chemotherapy education was 60 minutes with more than 50% spent discussing diagnosis, prognosis and symptom management.

## 2020-06-29 ENCOUNTER — Inpatient Hospital Stay: Payer: Medicare Other

## 2020-06-29 ENCOUNTER — Other Ambulatory Visit: Payer: Self-pay | Admitting: Hematology and Oncology

## 2020-06-29 ENCOUNTER — Inpatient Hospital Stay (INDEPENDENT_AMBULATORY_CARE_PROVIDER_SITE_OTHER): Payer: Medicare Other | Admitting: Hematology and Oncology

## 2020-06-29 ENCOUNTER — Other Ambulatory Visit: Payer: Self-pay

## 2020-06-29 VITALS — BP 135/66 | HR 84 | Temp 98.3°F | Resp 18 | Ht 62.0 in | Wt 146.4 lb

## 2020-06-29 DIAGNOSIS — C50911 Malignant neoplasm of unspecified site of right female breast: Secondary | ICD-10-CM

## 2020-06-29 LAB — HEPATIC FUNCTION PANEL
ALT: 33 (ref 7–35)
AST: 36 — AB (ref 13–35)
Alkaline Phosphatase: 56 (ref 25–125)
Bilirubin, Total: 0.5

## 2020-06-29 LAB — CBC AND DIFFERENTIAL
HCT: 34 — AB (ref 36–46)
Hemoglobin: 11.7 — AB (ref 12.0–16.0)
Neutrophils Absolute: 2.12
Platelets: 185 (ref 150–399)
WBC: 4.5

## 2020-06-29 LAB — BASIC METABOLIC PANEL
BUN: 26 — AB (ref 4–21)
CO2: 34 — AB (ref 13–22)
Chloride: 102 (ref 99–108)
Creatinine: 0.7 (ref 0.5–1.1)
Glucose: 139
Potassium: 4.6 (ref 3.4–5.3)
Sodium: 140 (ref 137–147)

## 2020-06-29 LAB — CBC: RBC: 3.54 — AB (ref 3.87–5.11)

## 2020-06-29 LAB — MAGNESIUM: Magnesium: 2

## 2020-06-29 LAB — COMPREHENSIVE METABOLIC PANEL
Albumin: 4.5 (ref 3.5–5.0)
Calcium: 9.8 (ref 8.7–10.7)

## 2020-06-29 MED ORDER — DEXAMETHASONE 4 MG PO TABS: 8.0000 mg | ORAL_TABLET | Freq: Two times a day (BID) | ORAL | 1 refills | Status: DC

## 2020-06-29 MED ORDER — ONDANSETRON HCL 8 MG PO TABS: 8.0000 mg | ORAL_TABLET | Freq: Two times a day (BID) | ORAL | 1 refills | Status: DC | PRN

## 2020-06-29 MED ORDER — PROCHLORPERAZINE MALEATE 10 MG PO TABS: 10.0000 mg | ORAL_TABLET | Freq: Four times a day (QID) | ORAL | 1 refills | Status: DC | PRN

## 2020-07-02 ENCOUNTER — Other Ambulatory Visit: Payer: Self-pay | Admitting: Hematology and Oncology

## 2020-07-02 DIAGNOSIS — C50911 Malignant neoplasm of unspecified site of right female breast: Secondary | ICD-10-CM

## 2020-07-02 MED ORDER — LIDOCAINE-PRILOCAINE 2.5-2.5 % EX CREA: TOPICAL_CREAM | CUTANEOUS | 3 refills | Status: DC

## 2020-07-03 ENCOUNTER — Other Ambulatory Visit: Payer: Self-pay

## 2020-07-03 ENCOUNTER — Inpatient Hospital Stay: Payer: Medicare Other

## 2020-07-03 VITALS — BP 161/73 | HR 109 | Temp 98.2°F | Resp 18 | Ht 62.0 in | Wt 146.2 lb

## 2020-07-03 DIAGNOSIS — Z1501 Genetic susceptibility to malignant neoplasm of breast: Secondary | ICD-10-CM | POA: Diagnosis not present

## 2020-07-03 DIAGNOSIS — Z5112 Encounter for antineoplastic immunotherapy: Secondary | ICD-10-CM | POA: Diagnosis not present

## 2020-07-03 DIAGNOSIS — C773 Secondary and unspecified malignant neoplasm of axilla and upper limb lymph nodes: Secondary | ICD-10-CM | POA: Diagnosis not present

## 2020-07-03 DIAGNOSIS — Z79899 Other long term (current) drug therapy: Secondary | ICD-10-CM | POA: Diagnosis not present

## 2020-07-03 DIAGNOSIS — R197 Diarrhea, unspecified: Secondary | ICD-10-CM | POA: Diagnosis not present

## 2020-07-03 DIAGNOSIS — Z79811 Long term (current) use of aromatase inhibitors: Secondary | ICD-10-CM | POA: Diagnosis not present

## 2020-07-03 DIAGNOSIS — C50911 Malignant neoplasm of unspecified site of right female breast: Secondary | ICD-10-CM

## 2020-07-03 DIAGNOSIS — Z7952 Long term (current) use of systemic steroids: Secondary | ICD-10-CM | POA: Diagnosis not present

## 2020-07-03 DIAGNOSIS — Z7984 Long term (current) use of oral hypoglycemic drugs: Secondary | ICD-10-CM | POA: Diagnosis not present

## 2020-07-03 DIAGNOSIS — Z7982 Long term (current) use of aspirin: Secondary | ICD-10-CM | POA: Diagnosis not present

## 2020-07-03 DIAGNOSIS — R519 Headache, unspecified: Secondary | ICD-10-CM | POA: Diagnosis not present

## 2020-07-03 DIAGNOSIS — Z170 Estrogen receptor positive status [ER+]: Secondary | ICD-10-CM | POA: Diagnosis not present

## 2020-07-03 DIAGNOSIS — G629 Polyneuropathy, unspecified: Secondary | ICD-10-CM | POA: Diagnosis not present

## 2020-07-03 DIAGNOSIS — C50212 Malignant neoplasm of upper-inner quadrant of left female breast: Secondary | ICD-10-CM | POA: Diagnosis present

## 2020-07-03 DIAGNOSIS — Z5189 Encounter for other specified aftercare: Secondary | ICD-10-CM | POA: Diagnosis not present

## 2020-07-03 DIAGNOSIS — Z9013 Acquired absence of bilateral breasts and nipples: Secondary | ICD-10-CM | POA: Diagnosis not present

## 2020-07-03 DIAGNOSIS — Z1509 Genetic susceptibility to other malignant neoplasm: Secondary | ICD-10-CM | POA: Diagnosis not present

## 2020-07-03 DIAGNOSIS — Z5111 Encounter for antineoplastic chemotherapy: Secondary | ICD-10-CM | POA: Diagnosis present

## 2020-07-03 MED ORDER — ACETAMINOPHEN 325 MG PO TABS
ORAL_TABLET | ORAL | Status: AC
  Filled 2020-07-03: qty 2

## 2020-07-03 MED ORDER — DIPHENHYDRAMINE HCL 50 MG/ML IJ SOLN
INTRAMUSCULAR | Status: AC
  Filled 2020-07-03: qty 1

## 2020-07-03 MED ORDER — TRASTUZUMAB-ANNS CHEMO 150 MG IV SOLR
8.0000 mg/kg | Freq: Once | INTRAVENOUS | Status: AC
  Administered 2020-07-03: 525 mg via INTRAVENOUS
  Filled 2020-07-03: qty 25

## 2020-07-03 MED ORDER — SODIUM CHLORIDE 0.9 % IV SOLN
Freq: Once | INTRAVENOUS | Status: AC
  Filled 2020-07-03: qty 250

## 2020-07-03 MED ORDER — PALONOSETRON HCL INJECTION 0.25 MG/5ML
0.2500 mg | Freq: Once | INTRAVENOUS | Status: AC
  Administered 2020-07-03: 0.25 mg via INTRAVENOUS

## 2020-07-03 MED ORDER — PALONOSETRON HCL INJECTION 0.25 MG/5ML
INTRAVENOUS | Status: AC
  Filled 2020-07-03: qty 5

## 2020-07-03 MED ORDER — CARBOPLATIN CHEMO INJECTION 600 MG/60ML
492.6000 mg | Freq: Once | INTRAVENOUS | Status: AC
Start: 2020-07-03 — End: 2020-07-03
  Administered 2020-07-03: 490 mg via INTRAVENOUS
  Filled 2020-07-03: qty 49

## 2020-07-03 MED ORDER — SODIUM CHLORIDE 0.9 % IV SOLN
150.0000 mg | Freq: Once | INTRAVENOUS | Status: AC
  Administered 2020-07-03: 150 mg via INTRAVENOUS
  Filled 2020-07-03: qty 150

## 2020-07-03 MED ORDER — DIPHENHYDRAMINE HCL 50 MG/ML IJ SOLN
50.0000 mg | Freq: Once | INTRAMUSCULAR | Status: AC
  Administered 2020-07-03: 50 mg via INTRAVENOUS

## 2020-07-03 MED ORDER — SODIUM CHLORIDE 0.9 % IV SOLN
75.0000 mg/m2 | Freq: Once | INTRAVENOUS | Status: AC
  Administered 2020-07-03: 130 mg via INTRAVENOUS
  Filled 2020-07-03: qty 13

## 2020-07-03 MED ORDER — SODIUM CHLORIDE 0.9 % IV SOLN
10.0000 mg | Freq: Once | INTRAVENOUS | Status: AC
  Administered 2020-07-03: 10 mg via INTRAVENOUS
  Filled 2020-07-03: qty 10

## 2020-07-03 MED ORDER — SODIUM CHLORIDE 0.9 % IV SOLN
840.0000 mg | Freq: Once | INTRAVENOUS | Status: AC
  Administered 2020-07-03: 840 mg via INTRAVENOUS
  Filled 2020-07-03: qty 28

## 2020-07-03 MED ORDER — ACETAMINOPHEN 325 MG PO TABS
650.0000 mg | ORAL_TABLET | Freq: Once | ORAL | Status: AC
  Administered 2020-07-03: 650 mg via ORAL

## 2020-07-03 MED ORDER — HEPARIN SOD (PORK) LOCK FLUSH 100 UNIT/ML IV SOLN
500.0000 [IU] | Freq: Once | INTRAVENOUS | Status: AC | PRN
  Administered 2020-07-03: 500 [IU]
  Filled 2020-07-03: qty 5

## 2020-07-03 NOTE — Patient Instructions (Signed)
Carboplatin injection What is this medicine? CARBOPLATIN (KAR boe pla tin) is a chemotherapy drug. It targets fast dividing cells, like cancer cells, and causes these cells to die. This medicine is used to treat ovarian cancer and many other cancers. This medicine may be used for other purposes; ask your health care provider or pharmacist if you have questions. COMMON BRAND NAME(S): Paraplatin What should I tell my health care provider before I take this medicine? They need to know if you have any of these conditions:  blood disorders  hearing problems  kidney disease  recent or ongoing radiation therapy  an unusual or allergic reaction to carboplatin, cisplatin, other chemotherapy, other medicines, foods, dyes, or preservatives  pregnant or trying to get pregnant  breast-feeding How should I use this medicine? This drug is usually given as an infusion into a vein. It is administered in a hospital or clinic by a specially trained health care professional. Talk to your pediatrician regarding the use of this medicine in children. Special care may be needed. Overdosage: If you think you have taken too much of this medicine contact a poison control center or emergency room at once. NOTE: This medicine is only for you. Do not share this medicine with others. What if I miss a dose? It is important not to miss a dose. Call your doctor or health care professional if you are unable to keep an appointment. What may interact with this medicine?  medicines for seizures  medicines to increase blood counts like filgrastim, pegfilgrastim, sargramostim  some antibiotics like amikacin, gentamicin, neomycin, streptomycin, tobramycin  vaccines Talk to your doctor or health care professional before taking any of these medicines:  acetaminophen  aspirin  ibuprofen  ketoprofen  naproxen This list may not describe all possible interactions. Give your health care provider a list of all the  medicines, herbs, non-prescription drugs, or dietary supplements you use. Also tell them if you smoke, drink alcohol, or use illegal drugs. Some items may interact with your medicine. What should I watch for while using this medicine? Your condition will be monitored carefully while you are receiving this medicine. You will need important blood work done while you are taking this medicine. This drug may make you feel generally unwell. This is not uncommon, as chemotherapy can affect healthy cells as well as cancer cells. Report any side effects. Continue your course of treatment even though you feel ill unless your doctor tells you to stop. In some cases, you may be given additional medicines to help with side effects. Follow all directions for their use. Call your doctor or health care professional for advice if you get a fever, chills or sore throat, or other symptoms of a cold or flu. Do not treat yourself. This drug decreases your body's ability to fight infections. Try to avoid being around people who are sick. This medicine may increase your risk to bruise or bleed. Call your doctor or health care professional if you notice any unusual bleeding. Be careful brushing and flossing your teeth or using a toothpick because you may get an infection or bleed more easily. If you have any dental work done, tell your dentist you are receiving this medicine. Avoid taking products that contain aspirin, acetaminophen, ibuprofen, naproxen, or ketoprofen unless instructed by your doctor. These medicines may hide a fever. Do not become pregnant while taking this medicine. Women should inform their doctor if they wish to become pregnant or think they might be pregnant. There is a  potential for serious side effects to an unborn child. Talk to your health care professional or pharmacist for more information. Do not breast-feed an infant while taking this medicine. What side effects may I notice from receiving this  medicine? Side effects that you should report to your doctor or health care professional as soon as possible:  allergic reactions like skin rash, itching or hives, swelling of the face, lips, or tongue  signs of infection - fever or chills, cough, sore throat, pain or difficulty passing urine  signs of decreased platelets or bleeding - bruising, pinpoint red spots on the skin, black, tarry stools, nosebleeds  signs of decreased red blood cells - unusually weak or tired, fainting spells, lightheadedness  breathing problems  changes in hearing  changes in vision  chest pain  high blood pressure  low blood counts - This drug may decrease the number of white blood cells, red blood cells and platelets. You may be at increased risk for infections and bleeding.  nausea and vomiting  pain, swelling, redness or irritation at the injection site  pain, tingling, numbness in the hands or feet  problems with balance, talking, walking  trouble passing urine or change in the amount of urine Side effects that usually do not require medical attention (report to your doctor or health care professional if they continue or are bothersome):  hair loss  loss of appetite  metallic taste in the mouth or changes in taste This list may not describe all possible side effects. Call your doctor for medical advice about side effects. You may report side effects to FDA at 1-800-FDA-1088. Where should I keep my medicine? This drug is given in a hospital or clinic and will not be stored at home. NOTE: This sheet is a summary. It may not cover all possible information. If you have questions about this medicine, talk to your doctor, pharmacist, or health care provider.  2021 Elsevier/Gold Standard (2007-08-10 14:38:05) Docetaxel injection What is this medicine? DOCETAXEL (doe se TAX el) is a chemotherapy drug. It targets fast dividing cells, like cancer cells, and causes these cells to die. This medicine  is used to treat many types of cancers like breast cancer, certain stomach cancers, head and neck cancer, lung cancer, and prostate cancer. This medicine may be used for other purposes; ask your health care provider or pharmacist if you have questions. COMMON BRAND NAME(S): Docefrez, Taxotere What should I tell my health care provider before I take this medicine? They need to know if you have any of these conditions:  infection (especially a virus infection such as chickenpox, cold sores, or herpes)  liver disease  low blood counts, like low white cell, platelet, or red cell counts  an unusual or allergic reaction to docetaxel, polysorbate 80, other chemotherapy agents, other medicines, foods, dyes, or preservatives  pregnant or trying to get pregnant  breast-feeding How should I use this medicine? This drug is given as an infusion into a vein. It is administered in a hospital or clinic by a specially trained health care professional. Talk to your pediatrician regarding the use of this medicine in children. Special care may be needed. Overdosage: If you think you have taken too much of this medicine contact a poison control center or emergency room at once. NOTE: This medicine is only for you. Do not share this medicine with others. What if I miss a dose? It is important not to miss your dose. Call your doctor or health care professional  if you are unable to keep an appointment. What may interact with this medicine? Do not take this medicine with any of the following medications:  live virus vaccines This medicine may also interact with the following medications:  aprepitant  certain antibiotics like erythromycin or clarithromycin  certain antivirals for HIV or hepatitis  certain medicines for fungal infections like fluconazole, itraconazole, ketoconazole, posaconazole, or  voriconazole  cimetidine  ciprofloxacin  conivaptan  cyclosporine  dronedarone  fluvoxamine  grapefruit juice  imatinib  verapamil This list may not describe all possible interactions. Give your health care provider a list of all the medicines, herbs, non-prescription drugs, or dietary supplements you use. Also tell them if you smoke, drink alcohol, or use illegal drugs. Some items may interact with your medicine. What should I watch for while using this medicine? Your condition will be monitored carefully while you are receiving this medicine. You will need important blood work done while you are taking this medicine. Call your doctor or health care professional for advice if you get a fever, chills or sore throat, or other symptoms of a cold or flu. Do not treat yourself. This drug decreases your body's ability to fight infections. Try to avoid being around people who are sick. Some products may contain alcohol. Ask your health care professional if this medicine contains alcohol. Be sure to tell all health care professionals you are taking this medicine. Certain medicines, like metronidazole and disulfiram, can cause an unpleasant reaction when taken with alcohol. The reaction includes flushing, headache, nausea, vomiting, sweating, and increased thirst. The reaction can last from 30 minutes to several hours. You may get drowsy or dizzy. Do not drive, use machinery, or do anything that needs mental alertness until you know how this medicine affects you. Do not stand or sit up quickly, especially if you are an older patient. This reduces the risk of dizzy or fainting spells. Alcohol may interfere with the effect of this medicine. Talk to your health care professional about your risk of cancer. You may be more at risk for certain types of cancer if you take this medicine. Do not become pregnant while taking this medicine or for 6 months after stopping it. Women should inform their doctor if  they wish to become pregnant or think they might be pregnant. There is a potential for serious side effects to an unborn child. Talk to your health care professional or pharmacist for more information. Do not breast-feed an infant while taking this medicine or for 1 week after stopping it. Males who get this medicine must use a condom during sex with females who can get pregnant. If you get a woman pregnant, the baby could have birth defects. The baby could die before they are born. You will need to continue wearing a condom for 3 months after stopping the medicine. Tell your health care provider right away if your partner becomes pregnant while you are taking this medicine. This may interfere with the ability to father a child. You should talk to your doctor or health care professional if you are concerned about your fertility. What side effects may I notice from receiving this medicine? Side effects that you should report to your doctor or health care professional as soon as possible:  allergic reactions like skin rash, itching or hives, swelling of the face, lips, or tongue  blurred vision  breathing problems  changes in vision  low blood counts - This drug may decrease the number of white blood  cells, red blood cells and platelets. You may be at increased risk for infections and bleeding.  nausea and vomiting  pain, redness or irritation at site where injected  pain, tingling, numbness in the hands or feet  redness, blistering, peeling, or loosening of the skin, including inside the mouth  signs of decreased platelets or bleeding - bruising, pinpoint red spots on the skin, black, tarry stools, nosebleeds  signs of decreased red blood cells - unusually weak or tired, fainting spells, lightheadedness  signs of infection - fever or chills, cough, sore throat, pain or difficulty passing urine  swelling of the ankle, feet, hands Side effects that usually do not require medical attention  (report to your doctor or health care professional if they continue or are bothersome):  constipation  diarrhea  fingernail or toenail changes  hair loss  loss of appetite  mouth sores  muscle pain This list may not describe all possible side effects. Call your doctor for medical advice about side effects. You may report side effects to FDA at 1-800-FDA-1088. Where should I keep my medicine? This drug is given in a hospital or clinic and will not be stored at home. NOTE: This sheet is a summary. It may not cover all possible information. If you have questions about this medicine, talk to your doctor, pharmacist, or health care provider.  2021 Elsevier/Gold Standard (2019-04-04 19:50:31) Pertuzumab injection What is this medicine? PERTUZUMAB (per TOOZ ue mab) is a monoclonal antibody. It is used to treat breast cancer. This medicine may be used for other purposes; ask your health care provider or pharmacist if you have questions. COMMON BRAND NAME(S): PERJETA What should I tell my health care provider before I take this medicine? They need to know if you have any of these conditions:  heart disease  heart failure  high blood pressure  history of irregular heart beat  recent or ongoing radiation therapy  an unusual or allergic reaction to pertuzumab, other medicines, foods, dyes, or preservatives  pregnant or trying to get pregnant  breast-feeding How should I use this medicine? This medicine is for infusion into a vein. It is given by a health care professional in a hospital or clinic setting. Talk to your pediatrician regarding the use of this medicine in children. Special care may be needed. Overdosage: If you think you have taken too much of this medicine contact a poison control center or emergency room at once. NOTE: This medicine is only for you. Do not share this medicine with others. What if I miss a dose? It is important not to miss your dose. Call your  doctor or health care professional if you are unable to keep an appointment. What may interact with this medicine? Interactions are not expected. Give your health care provider a list of all the medicines, herbs, non-prescription drugs, or dietary supplements you use. Also tell them if you smoke, drink alcohol, or use illegal drugs. Some items may interact with your medicine. This list may not describe all possible interactions. Give your health care provider a list of all the medicines, herbs, non-prescription drugs, or dietary supplements you use. Also tell them if you smoke, drink alcohol, or use illegal drugs. Some items may interact with your medicine. What should I watch for while using this medicine? Your condition will be monitored carefully while you are receiving this medicine. Report any side effects. Continue your course of treatment even though you feel ill unless your doctor tells you to stop. Do  not become pregnant while taking this medicine or for 7 months after stopping it. Women should inform their doctor if they wish to become pregnant or think they might be pregnant. Women of child-bearing potential will need to have a negative pregnancy test before starting this medicine. There is a potential for serious side effects to an unborn child. Talk to your health care professional or pharmacist for more information. Do not breast-feed an infant while taking this medicine or for 7 months after stopping it. Women must use effective birth control with this medicine. Call your doctor or health care professional for advice if you get a fever, chills or sore throat, or other symptoms of a cold or flu. Do not treat yourself. Try to avoid being around people who are sick. You may experience fever, chills, and headache during the infusion. Report any side effects during the infusion to your health care professional. What side effects may I notice from receiving this medicine? Side effects that you  should report to your doctor or health care professional as soon as possible:  breathing problems  chest pain or palpitations  dizziness  feeling faint or lightheaded  fever or chills  skin rash, itching or hives  sore throat  swelling of the face, lips, or tongue  swelling of the legs or ankles  unusually weak or tired Side effects that usually do not require medical attention (report to your doctor or health care professional if they continue or are bothersome):  diarrhea  hair loss  nausea, vomiting  tiredness This list may not describe all possible side effects. Call your doctor for medical advice about side effects. You may report side effects to FDA at 1-800-FDA-1088. Where should I keep my medicine? This drug is given in a hospital or clinic and will not be stored at home. NOTE: This sheet is a summary. It may not cover all possible information. If you have questions about this medicine, talk to your doctor, pharmacist, or health care provider.  2021 Elsevier/Gold Standard (2015-06-07 12:08:50) Trastuzumab injection for infusion What is this medicine? TRASTUZUMAB (tras TOO zoo mab) is a monoclonal antibody. It is used to treat breast cancer and stomach cancer. This medicine may be used for other purposes; ask your health care provider or pharmacist if you have questions. COMMON BRAND NAME(S): Herceptin, Galvin Proffer, Trazimera What should I tell my health care provider before I take this medicine? They need to know if you have any of these conditions:  heart disease  heart failure  lung or breathing disease, like asthma  an unusual or allergic reaction to trastuzumab, benzyl alcohol, or other medications, foods, dyes, or preservatives  pregnant or trying to get pregnant  breast-feeding How should I use this medicine? This drug is given as an infusion into a vein. It is administered in a hospital or clinic by a specially trained  health care professional. Talk to your pediatrician regarding the use of this medicine in children. This medicine is not approved for use in children. Overdosage: If you think you have taken too much of this medicine contact a poison control center or emergency room at once. NOTE: This medicine is only for you. Do not share this medicine with others. What if I miss a dose? It is important not to miss a dose. Call your doctor or health care professional if you are unable to keep an appointment. What may interact with this medicine? This medicine may interact with the following medications:  certain types of chemotherapy, such as daunorubicin, doxorubicin, epirubicin, and idarubicin This list may not describe all possible interactions. Give your health care provider a list of all the medicines, herbs, non-prescription drugs, or dietary supplements you use. Also tell them if you smoke, drink alcohol, or use illegal drugs. Some items may interact with your medicine. What should I watch for while using this medicine? Visit your doctor for checks on your progress. Report any side effects. Continue your course of treatment even though you feel ill unless your doctor tells you to stop. Call your doctor or health care professional for advice if you get a fever, chills or sore throat, or other symptoms of a cold or flu. Do not treat yourself. Try to avoid being around people who are sick. You may experience fever, chills and shaking during your first infusion. These effects are usually mild and can be treated with other medicines. Report any side effects during the infusion to your health care professional. Fever and chills usually do not happen with later infusions. Do not become pregnant while taking this medicine or for 7 months after stopping it. Women should inform their doctor if they wish to become pregnant or think they might be pregnant. Women of child-bearing potential will need to have a negative  pregnancy test before starting this medicine. There is a potential for serious side effects to an unborn child. Talk to your health care professional or pharmacist for more information. Do not breast-feed an infant while taking this medicine or for 7 months after stopping it. Women must use effective birth control with this medicine. What side effects may I notice from receiving this medicine? Side effects that you should report to your doctor or health care professional as soon as possible:  allergic reactions like skin rash, itching or hives, swelling of the face, lips, or tongue  chest pain or palpitations  cough  dizziness  feeling faint or lightheaded, falls  fever  general ill feeling or flu-like symptoms  signs of worsening heart failure like breathing problems; swelling in your legs and feet  unusually weak or tired Side effects that usually do not require medical attention (report to your doctor or health care professional if they continue or are bothersome):  bone pain  changes in taste  diarrhea  joint pain  nausea/vomiting  weight loss This list may not describe all possible side effects. Call your doctor for medical advice about side effects. You may report side effects to FDA at 1-800-FDA-1088. Where should I keep my medicine? This drug is given in a hospital or clinic and will not be stored at home. NOTE: This sheet is a summary. It may not cover all possible information. If you have questions about this medicine, talk to your doctor, pharmacist, or health care provider.  2021 Elsevier/Gold Standard (2016-04-29 14:37:52)

## 2020-07-04 ENCOUNTER — Telehealth: Payer: Self-pay

## 2020-07-04 NOTE — Telephone Encounter (Signed)
I spoke with pt to see how she tolerated her first chemo infusion. Pt states, "the only thing is my blood sugar went really high. It was 309 last night. I ate a high protein dinner and it came down a little more, and I took an extra metformin. My daughter was fixing to carry me to the emergency room because she was scared I was going to go into a coma. I almost called last night.  I took my blood sugar this morning, fasting & it was 243. My hands have been shaking some, my face is red, and I'm a little anxious". I educated pt that all of those symptoms more than likely are result of the steroids she received yesterday before chemotherapy. Pt was relieved to hear that.  Pt denies N/V, diarrhea, fever, and skin rashes.I told Jemina that I would notify the pharmacist and provider of her symptoms, in case they want to adjust any steroids @ next treatment.   I reminded pt of the importance of calling us if temp gets to 100.4 or over, day or night @ [phone]. Pt verbalized understanding and thanked me for calling to check on her.

## 2020-07-05 ENCOUNTER — Telehealth: Payer: Self-pay

## 2020-07-05 ENCOUNTER — Other Ambulatory Visit: Payer: Self-pay

## 2020-07-05 ENCOUNTER — Inpatient Hospital Stay: Payer: Medicare Other

## 2020-07-05 VITALS — BP 130/64 | HR 63 | Temp 98.2°F | Resp 18 | Ht 62.0 in | Wt 146.8 lb

## 2020-07-05 DIAGNOSIS — C50911 Malignant neoplasm of unspecified site of right female breast: Secondary | ICD-10-CM

## 2020-07-05 DIAGNOSIS — Z5112 Encounter for antineoplastic immunotherapy: Secondary | ICD-10-CM | POA: Diagnosis not present

## 2020-07-05 MED ORDER — PEGFILGRASTIM-BMEZ 6 MG/0.6ML ~~LOC~~ SOSY
6.0000 mg | PREFILLED_SYRINGE | Freq: Once | SUBCUTANEOUS | Status: AC
  Administered 2020-07-05: 6 mg via SUBCUTANEOUS

## 2020-07-05 MED ORDER — PEGFILGRASTIM-BMEZ 6 MG/0.6ML ~~LOC~~ SOSY
PREFILLED_SYRINGE | SUBCUTANEOUS | Status: AC
  Filled 2020-07-05: qty 0.6

## 2020-07-05 NOTE — Telephone Encounter (Addendum)
Pt notified of Dr Remi Deter response to decrease the steroids as listed below.   ----- Message from [name], MD sent at 07/04/2020  3:15 PM EST ----- Regarding: RE: Steroids side effects Yes,tell her next time to just take 1 pill day before and 1 pill day after & we can reduce the IV dose too ----- Message ----- From: Dairl Ponder, RN Sent: 07/04/2020   2:13 PM EST To: [name], MD, Juanetta Beets, RPH Subject: Steroids side effects                          I spoke with pt to see how she tolerated her first chemo infusion. Pt states, "the only thing is my blood sugar went really high. It was 309 last night. I ate a high protein dinner and it came down a little more, and I took an extra metformin. My daughter was fixing to carry me to the emergency room because she was scared I was going to go into a coma. I almost called last night.  I took my blood sugar this morning, fasting & it was 243. My hands have been shaking some, my face is red, and I'm a little anxious". I educated pt that all of those symptoms more than likely are result of the steroids she received yesterday before chemotherapy. Pt was relieved to hear that.  Pt denies N/V, diarrhea, fever, and skin rashes.I told [place] that I would notify the pharmacist and provider of her symptoms, in case they want to adjust any steroids @ next treatment.   I reminded pt of the importance of calling us if temp gets to 100.4 or over, day or night @ [phone]. Pt verbalized understanding and thanked me for calling to check on her.

## 2020-07-05 NOTE — Patient Instructions (Addendum)
Loratadine capsules or tablets What is this medicine? LORATADINE (lor AT a deen) is an antihistamine. It helps to relieve sneezing, runny nose, and itchy, watery eyes. This medicine is used to treat the symptoms of allergies. It is also used to treat itchy skin rash and hives. This medicine may be used for other purposes; ask your health care provider or pharmacist if you have questions. COMMON BRAND NAME(S): Alavert, Allergy Relief, Claritin, Claritin Hives Relief, Claritin Liqui-Gel, Claritin-D 24 Hour, Clear-Atadine, QlearQuil All Day & All Night Allergy Relief, Tavist ND What should I tell my health care provider before I take this medicine? They need to know if you have any of these conditions:  asthma  kidney disease  liver disease  an unusual or allergic reaction to loratadine, other antihistamines, other medicines, foods, dyes, or preservatives  pregnant or trying to get pregnant  breast-feeding How should I use this medicine? Take this medicine by mouth with a glass of water. Follow the directions on the label. You may take this medicine with food or on an empty stomach. Take your medicine at regular intervals. Do not take your medicine more often than directed. Talk to your pediatrician regarding the use of this medicine in children. While this medicine may be used in children as young as 6 years for selected conditions, precautions do apply. Overdosage: If you think you have taken too much of this medicine contact a poison control center or emergency room at once. NOTE: This medicine is only for you. Do not share this medicine with others. What if I miss a dose? If you miss a dose, take it as soon as you can. If it is almost time for your next dose, take only that dose. Do not take double or extra doses. What may interact with this medicine?  other medicines for colds or allergies This list may not describe all possible interactions. Give your health care provider a list of  all the medicines, herbs, non-prescription drugs, or dietary supplements you use. Also tell them if you smoke, drink alcohol, or use illegal drugs. Some items may interact with your medicine. What should I watch for while using this medicine? Tell your doctor or healthcare professional if your symptoms do not start to get better or if they get worse. Your mouth may get dry. Chewing sugarless gum or sucking hard candy, and drinking plenty of water may help. Contact your doctor if the problem does not go away or is severe. You may get drowsy or dizzy. Do not drive, use machinery, or do anything that needs mental alertness until you know how this medicine affects you. Do not stand or sit up quickly, especially if you are an older patient. This reduces the risk of dizzy or fainting spells. What side effects may I notice from receiving this medicine? Side effects that you should report to your doctor or health care professional as soon as possible:  allergic reactions like skin rash, itching or hives, swelling of the face, lips, or tongue  breathing problems  unusually restless or nervous Side effects that usually do not require medical attention (report to your doctor or health care professional if they continue or are bothersome):  drowsiness  dry or irritated mouth or throat  headache This list may not describe all possible side effects. Call your doctor for medical advice about side effects. You may report side effects to FDA at 1-800-FDA-1088. Where should I keep my medicine? Keep out of the reach of children. Store  at room temperature between 2 and 30 degrees C (36 and 86 degrees F). Protect from moisture. Throw away any unused medicine after the expiration date. NOTE: This sheet is a summary. It may not cover all possible information. If you have questions about this medicine, talk to your doctor, pharmacist, or health care provider.  2021 Elsevier/Gold Standard (2007-11-08  17:17:24) [facility] Discharge Instructions for Patients Receiving Chemotherapy  Today you received the following chemotherapy agents trastuzumab, carboplatin , docetaxel, pertuzumab, ziextenzo  To help prevent nausea and vomiting after your treatment, we encourage you to take your nausea medication.   If you develop nausea and vomiting that is not controlled by your nausea medication, call the clinic.   BELOW ARE SYMPTOMS THAT SHOULD BE REPORTED IMMEDIATELY:  *FEVER GREATER THAN 100.5 F  *CHILLS WITH OR WITHOUT FEVER  NAUSEA AND VOMITING THAT IS NOT CONTROLLED WITH YOUR NAUSEA MEDICATION  *UNUSUAL SHORTNESS OF BREATH  *UNUSUAL BRUISING OR BLEEDING  TENDERNESS IN MOUTH AND THROAT WITH OR WITHOUT PRESENCE OF ULCERS  *URINARY PROBLEMS  *BOWEL PROBLEMS  UNUSUAL RASH Items with * indicate a potential emergency and should be followed up as soon as possible.  Feel free to call the clinic should you have any questions or concerns at The clinic phone number is [phone].  Please show the [place] at check-in to the Emergency Department and triage nurse.

## 2020-07-09 ENCOUNTER — Other Ambulatory Visit: Payer: Self-pay | Admitting: Hematology and Oncology

## 2020-07-09 ENCOUNTER — Other Ambulatory Visit: Payer: Self-pay

## 2020-07-09 MED ORDER — HYDROCODONE-ACETAMINOPHEN 5-325 MG PO TABS: 1.0000 | ORAL_TABLET | Freq: Four times a day (QID) | ORAL | 0 refills | Status: DC | PRN

## 2020-07-09 MED ORDER — TRAMADOL HCL 50 MG PO TABS: 50.0000 mg | ORAL_TABLET | Freq: Four times a day (QID) | ORAL | 0 refills | Status: DC | PRN

## 2020-07-09 NOTE — Telephone Encounter (Addendum)
Prescription for MMW called to [place] per [facility]. I left the following prescription on pharmacy line.  MMW - equal parts of Maalox, benadryl, lidocaine  218mL bottle 3 refills  Pt to take 97mL po q 3-4hr PRN mouth sores. Swish & spit   Melissa,NP, discontinued the pain med sent in, and changed it to tramadol.   Received call from [place] stating that pt had allergy to codeine. I forwarded the call to [facility].  Melissa,NP, sent in eRx for hydrocodone and states she can have MMW as well.    I spoke with pt to confirm where her pain is, & which pain medication she is using.  Pt states she has had a headache since chemo last week. Nothing she takes is helping. She had a few tramadol from Dr Cristal Generous office, but they haven't really helped. She tried 2 tabs of tylenol 650mg  without relief. Pain is @ temple area, can barely stand to touch the area.

## 2020-07-10 ENCOUNTER — Other Ambulatory Visit: Payer: Self-pay

## 2020-07-10 NOTE — Telephone Encounter (Signed)
Pt notified that I had called in the MMW (leaving it on the pharmacy voicemail) prescription for MMW called into [facility] per Melissa P,NP. Equal parts of  Benadryl, Maalox, lidocaine  220mL 3 refills. Pt to take 78mL q 3-4 hrs PRN mouth pain /sores. SWISH & SPIT.

## 2020-07-13 ENCOUNTER — Encounter: Payer: Self-pay | Admitting: Hematology and Oncology

## 2020-07-13 ENCOUNTER — Inpatient Hospital Stay (INDEPENDENT_AMBULATORY_CARE_PROVIDER_SITE_OTHER): Payer: Medicare Other | Admitting: Hematology and Oncology

## 2020-07-13 ENCOUNTER — Inpatient Hospital Stay: Payer: Medicare Other

## 2020-07-13 ENCOUNTER — Telehealth: Payer: Self-pay | Admitting: Oncology

## 2020-07-13 ENCOUNTER — Other Ambulatory Visit: Payer: Self-pay

## 2020-07-13 VITALS — BP 115/57 | HR 98 | Temp 98.2°F | Resp 18 | Ht 62.0 in | Wt 137.5 lb

## 2020-07-13 DIAGNOSIS — C50911 Malignant neoplasm of unspecified site of right female breast: Secondary | ICD-10-CM | POA: Diagnosis not present

## 2020-07-13 LAB — HEPATIC FUNCTION PANEL
ALT: 41 — AB (ref 7–35)
AST: 38 — AB (ref 13–35)
Alkaline Phosphatase: 119 (ref 25–125)
Bilirubin, Total: 0.6

## 2020-07-13 LAB — BASIC METABOLIC PANEL
BUN: 10 (ref 4–21)
CO2: 31 — AB (ref 13–22)
Chloride: 99 (ref 99–108)
Creatinine: 0.6 (ref 0.5–1.1)
Glucose: 158
Potassium: 4.1 (ref 3.4–5.3)
Sodium: 137 (ref 137–147)

## 2020-07-13 LAB — MAGNESIUM: Magnesium: 1.8

## 2020-07-13 LAB — CBC AND DIFFERENTIAL
HCT: 36 (ref 36–46)
Hemoglobin: 12.1 (ref 12.0–16.0)
Neutrophils Absolute: 10.49
Platelets: 205 (ref 150–399)
WBC: 12.2

## 2020-07-13 LAB — CBC: RBC: 3.76 — AB (ref 3.87–5.11)

## 2020-07-13 LAB — COMPREHENSIVE METABOLIC PANEL
Albumin: 4.4 (ref 3.5–5.0)
Calcium: 9.8 (ref 8.7–10.7)

## 2020-07-13 MED ORDER — HYDROCODONE-ACETAMINOPHEN 7.5-325 MG PO TABS: 1.0000 | ORAL_TABLET | ORAL | 0 refills | Status: DC | PRN

## 2020-07-13 NOTE — Telephone Encounter (Signed)
Per 2/25 LOS, patient scheduled for 3/4 Labs, Follow Up 3/8 Infusion, 3/10 Injection.  Gave patient Appt Summary

## 2020-07-13 NOTE — Progress Notes (Signed)
Tammy Fowler  [address] 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 Excisional biopsy revealed a 2 cm, grade 2, infiltrating ductal carcinoma in the left lower quadrant.  Estrogen and progesterone receptors were positive and her 2 Neu negative.  Tumor was present at the margins, so she underwent re-excision and axillary dissection.  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  Four of sixteen lymph nodes were positive for metastasis.  All surgical margins were free of tumor involvement.  She received adjuvant chemotherapy with with dose dense Adriamycin and Cytoxan for 4 cycles, followed by dose dense Taxol for 4 cycles.  She received adjuvant radiation of the left breast.  She was placed on anastrozole 1 mg daily in April 2005 and completed 10 years of therapy in April 2015.  She underwent left latissimus dorsi muscle reconstruction of the left breast and right breast reduction.  She underwent total abdominal hysterectomy and bilateral salpingo-oophorectomy prior to anastrozole.  She had excision of a benign mass from the left breast in 2009.  She also underwent testing for BRCA mutations, including BART, in April 2013, which was negative, however, we do not have the results.  She has a history of osteoporosis, previously treated with Zometa every 6 months, which was discontinued when she completed anastrozole.  We began seeing her in July 2014, when  she relocated to this area.  Bone density scan in December 2015 was normal.  Her personal and family history wass suggestive of a hereditary cancer syndrome, so we recommended additional genetic testing with the Myriad myRisk Update Panel test to look for a mutation in other genes known to increase the risk for breast cancer and as well as other cancers.  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  These results were reviewed with her in detail at her February appointment and she was scheduled for an breast MRI which did not reveal any evidence of malignancy.  She is still debating as to whether she would want to have prophylactic bilateral mastectomies.  She was seen by Dr. Coralie Keens who referred her to Dr. Henrietta Hoover regarding reconstructive surgery.  She is interested in reconstruction and the Psychiatric nurse that she saw discouraged her because of the increased risk of complications with surgery on previously irradiated tissue.  The patient so far has continued close surveillance with annual mammogram and breast MRI spaced out 6 months.  Bone density in January 2019 was normal.  She has a breast MRI in August which was clear.  She developed diabetes and we did an MRI of the abdomen which was negative for pancreatic cancer.  She is at increased risk for this because of her ATM genetic mutation.  She did have colonoscopy in early 2020 by Dr. Marcell Anger in [place] and they  found 2 polyps.  They therefore recommend a repeat in 5 years.  She was seen in January of 2022 with a new mass.  Diagnostic left mammogram from January 12th revealed a suspicious mass measuring 2.0 cm at 10 o'clock, which accounted for the patients palpable concern.  Diagnostic right mammogram was clear.  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  HER2 positive.  Estrogen receptor was 30% positive and progesterone receptor was negative.  Ki67 was 25%.  However, there is no breast tissue in the sample and so this is more likely a recurrence rather than a new breast primary, even though it has been many years.  Her previous cancer was HER2 negative and ER positive.    INTERVAL HISTORY:  She is here for evaluation following cycle 1 TCHP. She states she tolerated her first cycle of chemotherapy fair. She has lingering decreased appetite, headaches and neuropathy. She had a spike to her blood glucose related to the premedication steroids. These were decreased to 1 tablet the day before and 1 tablet the day after chemotherapy. She had several days of diarrhea which is improving. She developed mouth sores for which Magic Mouthwash is helping. She also states she can feel sores in her nose. She denies fever, chills, nausea or vomiting. She denies shortness of breath, chest pain or cough. Her appetite is poor and she has lost 8 pounds since her last visit. CBC and CMP are unremarkable today.   REVIEW OF SYSTEMS:  Review of Systems  Constitutional: Positive for appetite change, fatigue and unexpected weight change. Negative for chills, diaphoresis and fever.  HENT:   Positive for mouth sores. Negative for hearing loss, lump/mass, nosebleeds, sore throat, tinnitus, trouble swallowing and voice change.   Eyes: Negative.  Negative for eye problems and icterus.  Respiratory: Negative.  Negative for chest tightness, cough, hemoptysis, shortness of breath and wheezing.   Cardiovascular: Negative.  Negative for chest pain, leg swelling and palpitations.  Gastrointestinal: Positive for diarrhea. Negative for abdominal distention, abdominal pain, blood in stool, constipation, nausea, rectal pain and vomiting.  Endocrine:  Negative.  Negative for hot flashes.  Genitourinary: Negative.  Negative for bladder incontinence, difficulty urinating, dyspareunia, dysuria, frequency, hematuria and nocturia.   Musculoskeletal: Positive for arthralgias. Negative for back pain, flank pain, gait problem, myalgias, neck pain and neck stiffness.  Skin: Negative.  Negative for itching, rash and wound.  Neurological: Negative.  Negative for dizziness, extremity weakness, gait problem, headaches, light-headedness, numbness, seizures and speech difficulty.  Hematological: Negative.  Negative for adenopathy. Does not bruise/bleed easily.  Psychiatric/Behavioral: Negative.  Negative for confusion, decreased concentration, depression, sleep disturbance and suicidal ideas. The patient is not nervous/anxious.      VITALS:  Blood pressure (!) 115/57, pulse 98, temperature 98.2 F (36.8 C), temperature source Oral, resp. rate 18, height 5\' 2"  (1.575 m), weight 137 lb 8 oz (62.4 kg), SpO2 98 %.  Wt Readings from Last 3 Encounters:  07/13/20 137 lb 8 oz (62.4 kg)  07/05/20 146 lb 12 oz (66.6 kg)  07/03/20 146 lb 3 oz (66.3 kg)    Body mass index is 25.15 kg/m.  Performance status (ECOG): 0 - Asymptomatic  PHYSICAL EXAM:  Physical Exam Constitutional:      General: She is not in acute distress.    Appearance: Normal appearance. She is normal weight.  She is not ill-appearing, toxic-appearing or diaphoretic.  HENT:     Head: Normocephalic and atraumatic.     Nose: Nose normal. No congestion or rhinorrhea.     Mouth/Throat:     Mouth: Mucous membranes are moist.     Pharynx: Oropharynx is clear. No oropharyngeal exudate or posterior oropharyngeal erythema.  Eyes:     General: No scleral icterus.       Right eye: No discharge.        Left eye: No discharge.     Extraocular Movements: Extraocular movements intact.     Conjunctiva/sclera: Conjunctivae normal.     Pupils: Pupils are equal, round, and reactive to light.  Neck:      Vascular: No carotid bruit.  Cardiovascular:     Rate and Rhythm: Regular rhythm. Tachycardia present.     Pulses: Normal pulses.     Heart sounds: Normal heart sounds. No murmur heard. No friction rub. No gallop.   Pulmonary:     Effort: Pulmonary effort is normal. No respiratory distress.     Breath sounds: Normal breath sounds. No stridor. No wheezing, rhonchi or rales.  Chest:     Chest wall: No tenderness.     Comments: Firm mass in the upper inner quadrant of the left breast measuring about 3 cm across.  The scar in the lateral left breast is well healed, axilla is negative.  No masses in the right breast. Abdominal:     General: Abdomen is flat. Bowel sounds are normal. There is no distension.     Palpations: Abdomen is soft. There is no mass.     Tenderness: There is no abdominal tenderness. There is no right CVA tenderness, left CVA tenderness, guarding or rebound.     Hernia: No hernia is present.  Musculoskeletal:        General: No swelling, tenderness, deformity or signs of injury. Normal range of motion.     Cervical back: Normal range of motion and neck supple. No rigidity or tenderness.     Right lower leg: No edema.     Left lower leg: No edema.  Lymphadenopathy:     Cervical: No cervical adenopathy.  Skin:    General: Skin is warm and dry.     Capillary Refill: Capillary refill takes less than 2 seconds.     Coloration: Skin is not jaundiced or pale.     Findings: No bruising, erythema, lesion or rash.  Neurological:     General: No focal deficit present.     Mental Status: She is alert and oriented to person, place, and time. Mental status is at baseline.     Cranial Nerves: No cranial nerve deficit.     Sensory: No sensory deficit.     Motor: No weakness.     Coordination: Coordination normal.     Gait: Gait normal.     Deep Tendon Reflexes: Reflexes normal.  Psychiatric:        Mood and Affect: Mood normal.        Behavior: Behavior normal.        Thought  Content: Thought content normal.        Judgment: Judgment normal.    LABS:   CBC Latest Ref Rng & Units 06/29/2020 05/16/2020  WBC - 4.5 4.0  Hemoglobin 12.0 - 16.0 11.7(A) 11.7(A)  Hematocrit 36 - 46 34(A) 35(A)  Platelets 150 - 399 185 181   CMP Latest Ref Rng & Units 06/29/2020 06/22/2020 05/16/2020  Glucose 70 - 99 mg/dL - 161(H) -  BUN 4 - 21 26(A) 16 21  Creatinine 0.5 - 1.1 0.7 0.77 0.7  Sodium 137 - 147 140 138 139  Potassium 3.4 - 5.3 4.6 4.6 4.2  Chloride 99 - 108 102 103 105  CO2 13 - 22 34(A) 25 26(A)  Calcium 8.7 - 10.7 9.8 10.2 9.8  Alkaline Phos 25 - 125 56 - 73  AST 13 - 35 36(A) - 30  ALT 7 - 35 33 - 26    STUDIES:    Allergies:  Allergies  Allergen Reactions  . Oxycodone Itching  . Codeine Other (See Comments)    Other reaction(s): Other (See Comments) Unknown Unknown   . Lisinopril     Other reaction(s): Cough (ALLERGY/intolerance)    Current Medications: Current Outpatient Medications  Medication Sig Dispense Refill  . HYDROcodone-acetaminophen (NORCO) 7.5-325 MG tablet Take 1 tablet by mouth every 4 (four) hours as needed for moderate pain. 120 tablet 0  . Accu-Chek FastClix Lancets MISC Apply topically.    Marland Kitchen aspirin 81 MG chewable tablet Chew by mouth.    . benzonatate (TESSALON) 100 MG capsule     . calcium-vitamin D 250-100 MG-UNIT tablet Take by mouth.    . celecoxib (CELEBREX) 100 MG capsule Take by mouth.    . dexamethasone (DECADRON) 4 MG tablet Take 2 tablets (8 mg total) by mouth 2 (two) times daily. Start the day before Taxotere. Then take daily x 3 days after chemotherapy. (Patient taking differently: Take 4 mg by mouth 2 (two) times daily. Start the day before Taxotere. Then take daily x 3 days after chemotherapy.) 30 tablet 1  . Docusate Sodium (DSS) 100 MG CAPS Take by mouth.    . DULoxetine (CYMBALTA) 60 MG capsule Take 1 capsule by mouth daily.    Marland Kitchen FLUoxetine (PROZAC) 40 MG capsule Take by mouth.    . fluticasone (FLONASE) 50  MCG/ACT nasal spray     . lidocaine-prilocaine (EMLA) cream Apply to affected area once 30 g 3  . losartan (COZAAR) 50 MG tablet daily.    . magic mouthwash (nystatin, lidocaine, diphenhydrAMINE) suspension Take 5 mLs by mouth every 3 (three) hours as needed for mouth pain. Swish & Spit; 231m [facility] 1305 per Charizma Gardiner P,NP    . metFORMIN (GLUCOPHAGE) 500 MG tablet 2 (two) times daily.    . Multiple Vitamins-Minerals (THERA-M) TABS Take by mouth.    . ondansetron (ZOFRAN) 8 MG tablet Take 1 tablet (8 mg total) by mouth 2 (two) times daily as needed (Nausea or vomiting). Start on the third day after chemotherapy. 30 tablet 1  . prochlorperazine (COMPAZINE) 10 MG tablet Take 1 tablet (10 mg total) by mouth every 6 (six) hours as needed (Nausea or vomiting). 30 tablet 1  . rosuvastatin (CRESTOR) 40 MG tablet daily.    . traMADol (ULTRAM) 50 MG tablet Take 1-2 tablets (50-100 mg total) by mouth every 6 (six) hours as needed. 30 tablet 0  . vitamin B-12 (CYANOCOBALAMIN) 500 MCG tablet Take 500 mcg by mouth daily.     No current facility-administered medications for this visit.     ASSESSMENT & PLAN:   Assessment:  1. New breast mass in the left upper inner quadrant which has now been confirmed as malignant as of January 2022.  However, this appears to be arising in the reconstructed adipose tissue rather than in the breast, and so it is  more likely a recurrence rather than a second breast primary.  There really is no way to prove this and the slides from her prior cancer are no longer available.  Breast prognostic profile confirmed HER2 positive and estrogen receptor was positive.   PET imaging is negative for signs of metastasis, so likely this is a stage I or IIA.  She completed her first cycle of TCHP last week.   2.  Diarrhea. We discussed using imodium and forcing fluids to replace those lost.  3. Mouth and nose sores. Magic mouthwash is helping. We  discussed using Bactroban ointment for her nose.   4. Neuropathy and headaches. She is requesting hydrocodone as the tramadol is not helping. She denies a true allergy with hydrocodone, just has itching.   Plan:  She will continue to use imodium as needed for diarrhea and will call if this does not work. I will send in hydrocodone for pain today. She will return to clinic in 2 weeks for repeat CBC, CMP and evaluation prior to her second cycle of chemotherapy.   She verbalizes understanding of and agreement to the plans discussed today. She knows to call the office should any new questions or concerns arise.    [name], NP [facility] [address] [place] [address] Dept: [phone] Dept Fax: [phone]  .

## 2020-07-17 ENCOUNTER — Other Ambulatory Visit: Payer: Self-pay | Admitting: Oncology

## 2020-07-19 NOTE — Progress Notes (Signed)
[facility]  [address] [place],  [place]  [address] [phone]  Clinic Day:  07/20/2020  Referring physician: Delphina Cahill, FNP   This document serves as a record of services personally performed by [name], MD. It was created on their behalf by Curry,Lauren E, a trained medical scribe. The creation of this record is based on the scribe's personal observations and the provider's statements to them.  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 Excisional biopsy revealed a 2 cm, grade 2, infiltrating ductal carcinoma in the left lower quadrant.  Estrogen and progesterone receptors were positive and her 2 Neu negative.  Tumor was present at the margins, so she underwent re-excision and axillary dissection.  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  Four of sixteen lymph nodes were positive for metastasis.  All surgical margins were free of tumor involvement.  She received adjuvant chemotherapy with with dose dense Adriamycin and Cytoxan for 4 cycles, followed by dose dense Taxol for 4 cycles.  She received adjuvant radiation of the left breast.  She was placed on anastrozole 1 mg daily in April 2005 and completed 10 years of therapy in April 2015.  She underwent left latissimus dorsi muscle reconstruction of the left breast and right breast reduction.  She underwent total abdominal hysterectomy and bilateral salpingo-oophorectomy prior to anastrozole.  She had excision of a benign mass from the left breast in 2009.  She also underwent testing for  BRCA mutations, including BART, in April 2013, which was negative, however, we do not have the results.  She has a history of osteoporosis, previously treated with Zometa every 6 months, which was discontinued when she completed anastrozole.  We began seeing her in July 2014, when she relocated to this area.  Bone density scan in December 2015 was normal.  Her personal and family history wass suggestive of a hereditary cancer syndrome, so we recommended additional genetic testing with the My[employer] myRisk Update Panel test to look for a mutation in other genes known to increase the risk for breast cancer and as well as other cancers.  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  These results were reviewed with her in detail at her February appointment and she was scheduled for an breast MRI which did not reveal any evidence of malignancy.  She is still debating as to whether she would want to have prophylactic bilateral mastectomies.  She was seen by Dr. Coralie Keens who referred her to Dr. Henrietta Hoover regarding reconstructive surgery.  She is interested in reconstruction and the Psychiatric nurse that she saw discouraged her because of the increased risk of complications with surgery on previously irradiated tissue.  The patient so far has continued close surveillance with annual mammogram and breast MRI spaced out 6 months.  Bone density in January 2019 was normal.  She has a breast MRI in August which was clear.  She  developed diabetes and we did an MRI of the abdomen which was negative for pancreatic cancer.  She is at increased risk for this because of her ATM genetic mutation.  She did have colonoscopy in early 2020 by Dr. Marcell Anger in [place] and they found 2 polyps.  They therefore recommend a repeat in 5 years.  She was seen in January of 2022 with a new mass.   Diagnostic left mammogram from January 12th revealed a suspicious mass measuring 2.0 cm at 10 o'clock, which accounted for the patients palpable concern.  Diagnostic right mammogram was clear.  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  HER2 positive.  Estrogen receptor was 30% positive and progesterone receptor was negative.  Ki67 was 25%.  However, there is no breast tissue in the sample and so this is more likely a recurrence rather than a new breast primary, even though it has been many years.  Her previous cancer was HER2 negative and ER positive.  She started treatment with [facility] on February 15th.  INTERVAL HISTORY:  She is here for follow up prior to a 2nd cycle of TCHP.  She started treatment on February 15th and states that it was fairly rough.  Her main issue was poor taste/appetite, and diarrhea despite imodium.  I will send in a prescription for Lomotil to better control her diarrhea as this has continued following every meal.  She has shaved her head as she has started to lose her hair.  Her hemoglobin has mildly decreased from 12.1 to 11.7, and her white count and platelets are normal.  Chemistries are unremarkable.  Her  appetite is fair, and she has lost 6 pounds since her last visit.  She denies fever, chills or other signs of infection.  She denies nausea, vomiting, bowel issues, or abdominal pain.  She denies sore throat, cough, dyspnea, or chest pain.  REVIEW OF SYSTEMS:  Review of Systems  Constitutional: Positive for appetite change (and poor taste) and unexpected weight change (6 pounds). Negative for chills, fatigue and fever.  HENT:  Negative.   Eyes: Negative.   Respiratory: Negative.  Negative for chest tightness, cough, hemoptysis, shortness of breath and wheezing.   Cardiovascular:  Negative.  Negative for chest pain, leg swelling and palpitations.  Gastrointestinal: Positive for diarrhea (despite Imodium). Negative for abdominal distention, abdominal pain, blood in stool, constipation, nausea and vomiting.  Endocrine: Negative.   Genitourinary: Negative.  Negative for difficulty urinating, dysuria, frequency and hematuria.   Musculoskeletal: Negative.  Negative for arthralgias, back pain, flank pain, gait problem and myalgias.  Skin: Negative.   Neurological: Negative.  Negative for dizziness, extremity weakness, gait problem, headaches, light-headedness, numbness, seizures and speech difficulty.  Hematological: Negative.   Psychiatric/Behavioral: Negative.  Negative for depression and sleep disturbance. The patient is not nervous/anxious.      VITALS:  Blood pressure 122/62, pulse 90, temperature 98.1 F (36.7 C), temperature source Oral, resp. rate 18, height _0  (1.575 m), weight 140 lb 3.2 oz (63.6 kg), SpO2 99 %.  Wt Readings from Last 3 Encounters:  07/20/20 140 lb 3.2 oz (63.6 kg)  07/13/20 137 lb 8 oz (62.4 kg)  07/05/20 146 lb 12 oz (66.6 kg)    Body mass index is 25.64 kg/m.  Performance status (ECOG): 1 - Symptomatic but completely ambulatory  PHYSICAL EXAM:  Physical Exam  Constitutional:      General: She is not in acute distress.    Appearance: Normal appearance. She is normal weight.  HENT:     Head: Normocephalic and atraumatic.  Eyes:     General: No scleral icterus.    Extraocular Movements: Extraocular movements intact.     Conjunctiva/sclera: Conjunctivae normal.     Pupils: Pupils are equal, round, and reactive to light.  Cardiovascular:     Rate and Rhythm: Normal rate and regular rhythm.     Pulses: Normal pulses.     Heart sounds: Normal heart sounds. No murmur heard. No friction rub. No gallop.   Pulmonary:     Effort: Pulmonary effort is normal. No respiratory distress.     Breath sounds: Normal breath sounds.  Chest:   Breasts:     Right: Normal.      Comments: Firm mass in the upper inner quadrant of the left breast measuring about 2.5 cm across.  The scar in the lateral left breast is well healed, axilla is negative.  No masses in the right breast. Abdominal:     General: Bowel sounds are normal. There is no distension.     Palpations: Abdomen is soft. There is no hepatomegaly, splenomegaly or mass.     Tenderness: There is no abdominal tenderness.  Musculoskeletal:        General: Normal range of motion.     Cervical back: Normal range of motion and neck supple.     Right lower leg: No edema.     Left lower leg: No edema.  Lymphadenopathy:     Cervical: No cervical adenopathy.  Skin:    General: Skin is warm and dry.  Neurological:     General: No focal deficit present.     Mental Status: She is alert and oriented to person, place, and time. Mental status is at baseline.  Psychiatric:        Mood and Affect: Mood normal.        Behavior: Behavior normal.        Thought Content: Thought content normal.        Judgment: Judgment normal.    LABS:   CBC Latest Ref Rng & Units 07/20/2020 07/13/2020 06/29/2020  WBC - 8.2 12.2 4.5  Hemoglobin 12.0 - 16.0 11.7(A) 12.1 11.7(A)  Hematocrit 36 - 46 35(A) 36 34(A)  Platelets 150 - 399 238 205 185   CMP Latest Ref Rng & Units 07/20/2020 07/13/2020 06/29/2020  Glucose 70 - 99 mg/dL - - -  BUN 4 - _0 26(A)  Creatinine 0.5 - 1.1 0.6 0.6 0.7  Sodium 137 - 147 140 137 140  Potassium 3.4 - 5.3 4.8 4.1 4.6  Chloride 99 - 108 103 99 102  CO2 13 - 22 30(A) 31(A) 34(A)  Calcium 8.7 - 10.7 10.1 9.8 9.8  Alkaline Phos 25 - 125 91 119 56  AST 13 - 35 30 38(A) 36(A)  ALT 7 - 35 35 41(A) 33    STUDIES:    Allergies:  Allergies  Allergen Reactions  . Oxycodone Itching  . Codeine Other (See Comments)    Other reaction(s): Other (See Comments) Unknown Unknown   . Lisinopril     Other reaction(s): Cough (ALLERGY/intolerance)    Current  Medications: Current Outpatient Medications  Medication Sig Dispense Refill  . Accu-Chek FastClix Lancets MISC Apply topically.    Marland Kitchen aspirin 81 MG chewable tablet Chew by mouth.    . benzonatate (  TESSALON) 100 MG capsule     . calcium-vitamin D 250-100 MG-UNIT tablet Take by mouth.    . celecoxib (CELEBREX) 100 MG capsule Take by mouth.    . dexamethasone (DECADRON) 4 MG tablet Take 2 tablets (8 mg total) by mouth 2 (two) times daily. Start the day before Taxotere. Then take daily x 3 days after chemotherapy. (Patient taking differently: Take 4 mg by mouth 2 (two) times daily. Start the day before Taxotere. Then take daily x 3 days after chemotherapy.) 30 tablet 1  . diphenoxylate-atropine (LOMOTIL) 2.5-0.025 MG tablet Take 2 tablets by mouth 4 (four) times daily as needed for diarrhea or loose stools. 60 tablet 3  . Docusate Sodium (DSS) 100 MG CAPS Take by mouth.    . DULoxetine (CYMBALTA) 60 MG capsule Take 1 capsule by mouth daily.    Marland Kitchen FLUoxetine (PROZAC) 40 MG capsule Take by mouth.    . fluticasone (FLONASE) 50 MCG/ACT nasal spray     . HYDROcodone-acetaminophen (NORCO) 7.5-325 MG tablet Take 1 tablet by mouth every 4 (four) hours as needed for moderate pain. 120 tablet 0  . lidocaine-prilocaine (EMLA) cream Apply to affected area once 30 g 3  . losartan (COZAAR) 50 MG tablet daily.    . magic mouthwash (nystatin, lidocaine, diphenhydrAMINE) suspension Take 5 mLs by mouth every 3 (three) hours as needed for mouth pain. Swish & Spit; 281m bottle [facility] @@ 1305 per Melissa P,NP    . metFORMIN (GLUCOPHAGE) 500 MG tablet 2 (two) times daily.    . Multiple Vitamins-Minerals (THERA-M) TABS Take by mouth.    . ondansetron (ZOFRAN) 8 MG tablet Take 1 tablet (8 mg total) by mouth 2 (two) times daily as needed (Nausea or vomiting). Start on the third day after chemotherapy. 30 tablet 1  . prochlorperazine (COMPAZINE) 10 MG tablet Take 1 tablet (10 mg total) by  mouth every 6 (six) hours as needed (Nausea or vomiting). 30 tablet 1  . rosuvastatin (CRESTOR) 40 MG tablet daily.    . traMADol (ULTRAM) 50 MG tablet Take 1-2 tablets (50-100 mg total) by mouth every 6 (six) hours as needed. 30 tablet 0  . vitamin B-12 (CYANOCOBALAMIN) 500 MCG tablet Take 500 mcg by mouth daily.     No current facility-administered medications for this visit.     ASSESSMENT & PLAN:   Assessment:  1. New breast mass in the left upper inner quadrant which has now been confirmed as malignant as of January 2022.  However, this appears to be arising in the reconstructed adipose tissue rather than in the breast, and so it is more likely a recurrence rather than a second breast primary.  There really is no way to prove this and the slides from her prior cancer are no longer available.  Breast prognostic profile confirmed HER2 positive and estrogen receptor was positive.   PET imaging is negative for signs of metastasis, so likely this is a stage I or IIA.  [facility]   2.  Diarrhea, despite Imodium.  I will send in a prescription for Lomotil.    3. Mouth and nose sores. Magic mouthwash is helping. We discussed using Bactroban ointment for her nose.   4. Neuropathy and headaches. She is requesting hydrocodone as the tramadol is not helping. She denies a true allergy with hydrocodone, just has itching.  Her headaches have improved.    Plan:  She will proceed  with a 2nd cycle of TCHP on March 8th.  I will send in a prescription for Lomotil to better control her diarrhea.  We will see her back in 3 weeks with CBC, CMP and evaluation prior to her 3rd cycle of chemotherapy.  She verbalizes understanding of and agreement to the plans discussed today. She knows to call the office should any new questions or concerns arise.    [name], MD [facility] AT [facility] [place] [address] Dept: [phone] Dept Fax: [phone]  . I, Rita Ohara, am acting as scribe for [name], MD  I have reviewed this report as typed by the medical scribe, and it is complete and accurate.

## 2020-07-20 ENCOUNTER — Other Ambulatory Visit: Payer: Self-pay | Admitting: Hematology and Oncology

## 2020-07-20 ENCOUNTER — Telehealth: Payer: Self-pay | Admitting: Oncology

## 2020-07-20 ENCOUNTER — Encounter: Payer: Self-pay | Admitting: Oncology

## 2020-07-20 ENCOUNTER — Other Ambulatory Visit: Payer: Self-pay

## 2020-07-20 ENCOUNTER — Inpatient Hospital Stay (INDEPENDENT_AMBULATORY_CARE_PROVIDER_SITE_OTHER): Payer: Medicare Other | Admitting: Oncology

## 2020-07-20 ENCOUNTER — Other Ambulatory Visit: Payer: Self-pay | Admitting: Oncology

## 2020-07-20 ENCOUNTER — Inpatient Hospital Stay: Payer: Medicare Other | Attending: Oncology

## 2020-07-20 VITALS — BP 122/62 | HR 90 | Temp 98.1°F | Resp 18 | Ht 62.0 in | Wt 140.2 lb

## 2020-07-20 DIAGNOSIS — C50912 Malignant neoplasm of unspecified site of left female breast: Secondary | ICD-10-CM | POA: Diagnosis not present

## 2020-07-20 DIAGNOSIS — Z79899 Other long term (current) drug therapy: Secondary | ICD-10-CM | POA: Insufficient documentation

## 2020-07-20 DIAGNOSIS — Z5111 Encounter for antineoplastic chemotherapy: Secondary | ICD-10-CM | POA: Insufficient documentation

## 2020-07-20 DIAGNOSIS — Z170 Estrogen receptor positive status [ER+]: Secondary | ICD-10-CM | POA: Insufficient documentation

## 2020-07-20 DIAGNOSIS — Z5189 Encounter for other specified aftercare: Secondary | ICD-10-CM | POA: Insufficient documentation

## 2020-07-20 DIAGNOSIS — R197 Diarrhea, unspecified: Secondary | ICD-10-CM | POA: Insufficient documentation

## 2020-07-20 DIAGNOSIS — Z7982 Long term (current) use of aspirin: Secondary | ICD-10-CM | POA: Insufficient documentation

## 2020-07-20 DIAGNOSIS — Z5112 Encounter for antineoplastic immunotherapy: Secondary | ICD-10-CM | POA: Insufficient documentation

## 2020-07-20 DIAGNOSIS — G629 Polyneuropathy, unspecified: Secondary | ICD-10-CM | POA: Insufficient documentation

## 2020-07-20 DIAGNOSIS — C50212 Malignant neoplasm of upper-inner quadrant of left female breast: Secondary | ICD-10-CM | POA: Insufficient documentation

## 2020-07-20 DIAGNOSIS — R519 Headache, unspecified: Secondary | ICD-10-CM | POA: Insufficient documentation

## 2020-07-20 LAB — HEPATIC FUNCTION PANEL
ALT: 35 (ref 7–35)
AST: 30 (ref 13–35)
Alkaline Phosphatase: 91 (ref 25–125)
Bilirubin, Total: 0.4

## 2020-07-20 LAB — BASIC METABOLIC PANEL
BUN: 14 (ref 4–21)
CO2: 30 — AB (ref 13–22)
Chloride: 103 (ref 99–108)
Creatinine: 0.6 (ref 0.5–1.1)
Glucose: 145
Potassium: 4.8 (ref 3.4–5.3)
Sodium: 140 (ref 137–147)

## 2020-07-20 LAB — CBC AND DIFFERENTIAL
HCT: 35 — AB (ref 36–46)
Hemoglobin: 11.7 — AB (ref 12.0–16.0)
Neutrophils Absolute: 6.31
Platelets: 238 (ref 150–399)
WBC: 8.2

## 2020-07-20 LAB — CBC: RBC: 3.66 — AB (ref 3.87–5.11)

## 2020-07-20 LAB — COMPREHENSIVE METABOLIC PANEL
Albumin: 4.6 (ref 3.5–5.0)
Calcium: 10.1 (ref 8.7–10.7)

## 2020-07-20 MED ORDER — DIPHENOXYLATE-ATROPINE 2.5-0.025 MG PO TABS: 2.0000 | ORAL_TABLET | Freq: Four times a day (QID) | ORAL | 3 refills | Status: DC | PRN

## 2020-07-20 NOTE — Telephone Encounter (Signed)
Per 3/4 LOS, patient scheduled for 3/25 Labs, Follow UP - 3/30 Infusion - 4/1 Injection.  Gave patient Appt Summary

## 2020-07-24 ENCOUNTER — Other Ambulatory Visit: Payer: Self-pay

## 2020-07-24 ENCOUNTER — Inpatient Hospital Stay: Payer: Medicare Other

## 2020-07-24 VITALS — BP 129/63 | HR 80 | Temp 98.3°F | Resp 18 | Ht 62.0 in | Wt 139.0 lb

## 2020-07-24 DIAGNOSIS — Z5189 Encounter for other specified aftercare: Secondary | ICD-10-CM | POA: Diagnosis not present

## 2020-07-24 DIAGNOSIS — G629 Polyneuropathy, unspecified: Secondary | ICD-10-CM | POA: Diagnosis not present

## 2020-07-24 DIAGNOSIS — Z5112 Encounter for antineoplastic immunotherapy: Secondary | ICD-10-CM | POA: Diagnosis not present

## 2020-07-24 DIAGNOSIS — R197 Diarrhea, unspecified: Secondary | ICD-10-CM | POA: Diagnosis not present

## 2020-07-24 DIAGNOSIS — Z5111 Encounter for antineoplastic chemotherapy: Secondary | ICD-10-CM | POA: Diagnosis present

## 2020-07-24 DIAGNOSIS — Z7982 Long term (current) use of aspirin: Secondary | ICD-10-CM | POA: Diagnosis not present

## 2020-07-24 DIAGNOSIS — R519 Headache, unspecified: Secondary | ICD-10-CM | POA: Diagnosis not present

## 2020-07-24 DIAGNOSIS — Z170 Estrogen receptor positive status [ER+]: Secondary | ICD-10-CM | POA: Diagnosis not present

## 2020-07-24 DIAGNOSIS — C50212 Malignant neoplasm of upper-inner quadrant of left female breast: Secondary | ICD-10-CM | POA: Diagnosis present

## 2020-07-24 DIAGNOSIS — Z79899 Other long term (current) drug therapy: Secondary | ICD-10-CM | POA: Diagnosis not present

## 2020-07-24 DIAGNOSIS — C50911 Malignant neoplasm of unspecified site of right female breast: Secondary | ICD-10-CM

## 2020-07-24 MED ORDER — ACETAMINOPHEN 325 MG PO TABS
ORAL_TABLET | ORAL | Status: AC
  Filled 2020-07-24: qty 2

## 2020-07-24 MED ORDER — TRASTUZUMAB-ANNS CHEMO 150 MG IV SOLR
6.0000 mg/kg | Freq: Once | INTRAVENOUS | Status: AC
  Administered 2020-07-24: 399 mg via INTRAVENOUS
  Filled 2020-07-24: qty 19

## 2020-07-24 MED ORDER — PALONOSETRON HCL INJECTION 0.25 MG/5ML
INTRAVENOUS | Status: AC
  Filled 2020-07-24: qty 5

## 2020-07-24 MED ORDER — SODIUM CHLORIDE 0.9 % IV SOLN
75.0000 mg/m2 | Freq: Once | INTRAVENOUS | Status: AC
  Administered 2020-07-24: 130 mg via INTRAVENOUS
  Filled 2020-07-24: qty 13

## 2020-07-24 MED ORDER — PALONOSETRON HCL INJECTION 0.25 MG/5ML
0.2500 mg | Freq: Once | INTRAVENOUS | Status: AC
Start: 2020-07-24 — End: 2020-07-24
  Administered 2020-07-24: 0.25 mg via INTRAVENOUS

## 2020-07-24 MED ORDER — SODIUM CHLORIDE 0.9 % IV SOLN
492.6000 mg | Freq: Once | INTRAVENOUS | Status: AC
  Administered 2020-07-24: 490 mg via INTRAVENOUS
  Filled 2020-07-24: qty 49

## 2020-07-24 MED ORDER — HEPARIN SOD (PORK) LOCK FLUSH 100 UNIT/ML IV SOLN
500.0000 [IU] | Freq: Once | INTRAVENOUS | Status: AC | PRN
  Administered 2020-07-24: 500 [IU]
  Filled 2020-07-24: qty 5

## 2020-07-24 MED ORDER — DEXAMETHASONE SODIUM PHOSPHATE 10 MG/ML IJ SOLN
8.0000 mg | Freq: Once | INTRAMUSCULAR | Status: AC
  Administered 2020-07-24: 8 mg via INTRAVENOUS

## 2020-07-24 MED ORDER — ACETAMINOPHEN 325 MG PO TABS
650.0000 mg | ORAL_TABLET | Freq: Once | ORAL | Status: AC
  Administered 2020-07-24: 650 mg via ORAL

## 2020-07-24 MED ORDER — DIPHENHYDRAMINE HCL 50 MG/ML IJ SOLN
INTRAMUSCULAR | Status: AC
  Filled 2020-07-24: qty 1

## 2020-07-24 MED ORDER — SODIUM CHLORIDE 0.9 % IV SOLN
150.0000 mg | Freq: Once | INTRAVENOUS | Status: AC
  Administered 2020-07-24: 150 mg via INTRAVENOUS
  Filled 2020-07-24: qty 5

## 2020-07-24 MED ORDER — SODIUM CHLORIDE 0.9 % IV SOLN
420.0000 mg | Freq: Once | INTRAVENOUS | Status: AC
  Administered 2020-07-24: 420 mg via INTRAVENOUS
  Filled 2020-07-24: qty 14

## 2020-07-24 MED ORDER — DIPHENHYDRAMINE HCL 50 MG/ML IJ SOLN
50.0000 mg | Freq: Once | INTRAMUSCULAR | Status: AC
  Administered 2020-07-24: 50 mg via INTRAVENOUS

## 2020-07-24 MED ORDER — SODIUM CHLORIDE 0.9 % IV SOLN
Freq: Once | INTRAVENOUS | Status: AC
  Filled 2020-07-24: qty 250

## 2020-07-24 MED ORDER — DEXAMETHASONE SODIUM PHOSPHATE 10 MG/ML IJ SOLN
INTRAMUSCULAR | Status: AC
  Filled 2020-07-24: qty 1

## 2020-07-24 NOTE — Progress Notes (Signed)
1358: PT STABLE AT TIME OF DISCHARGE

## 2020-07-24 NOTE — Patient Instructions (Signed)
[facility] Discharge Instructions for Patients Receiving Chemotherapy  Today you received the following chemotherapy agents Pertuzumab,Trastuzumab,Carboplatin,Docetaxel  To help prevent nausea and vomiting after your treatment, we encourage you to take your nausea medication as directed   If you develop nausea and vomiting that is not controlled by your nausea medication, call the clinic.   BELOW ARE SYMPTOMS THAT SHOULD BE REPORTED IMMEDIATELY:  *FEVER GREATER THAN 100.5 F  *CHILLS WITH OR WITHOUT FEVER  NAUSEA AND VOMITING THAT IS NOT CONTROLLED WITH YOUR NAUSEA MEDICATION  *UNUSUAL SHORTNESS OF BREATH  *UNUSUAL BRUISING OR BLEEDING  TENDERNESS IN MOUTH AND THROAT WITH OR WITHOUT PRESENCE OF ULCERS  *URINARY PROBLEMS  *BOWEL PROBLEMS  UNUSUAL RASH Items with * indicate a potential emergency and should be followed up as soon as possible.  Feel free to call the clinic should you have any questions or concerns at The clinic phone number is [phone].  Please show the [facility] at check-in to the Emergency Department and triage nurse.

## 2020-07-26 ENCOUNTER — Inpatient Hospital Stay: Payer: Medicare Other

## 2020-07-26 ENCOUNTER — Other Ambulatory Visit: Payer: Self-pay

## 2020-07-26 VITALS — BP 147/69 | HR 82 | Temp 98.1°F | Resp 18 | Ht 62.0 in | Wt 139.1 lb

## 2020-07-26 DIAGNOSIS — C50911 Malignant neoplasm of unspecified site of right female breast: Secondary | ICD-10-CM

## 2020-07-26 DIAGNOSIS — Z5112 Encounter for antineoplastic immunotherapy: Secondary | ICD-10-CM | POA: Diagnosis not present

## 2020-07-26 MED ORDER — PEGFILGRASTIM-BMEZ 6 MG/0.6ML ~~LOC~~ SOSY
PREFILLED_SYRINGE | SUBCUTANEOUS | Status: AC
  Filled 2020-07-26: qty 0.6

## 2020-07-26 MED ORDER — PEGFILGRASTIM-BMEZ 6 MG/0.6ML ~~LOC~~ SOSY
6.0000 mg | PREFILLED_SYRINGE | Freq: Once | SUBCUTANEOUS | Status: AC
  Administered 2020-07-26: 6 mg via SUBCUTANEOUS

## 2020-07-26 NOTE — Patient Instructions (Signed)
Pegfilgrastim injection What is this medicine? PEGFILGRASTIM (PEG fil gra stim) is a long-acting granulocyte colony-stimulating factor that stimulates the growth of neutrophils, a type of white blood cell important in the body's fight against infection. It is used to reduce the incidence of fever and infection in patients with certain types of cancer who are receiving chemotherapy that affects the bone marrow, and to increase survival after being exposed to high doses of radiation. This medicine may be used for other purposes; ask your health care provider or pharmacist if you have questions. COMMON BRAND NAME(S): Fulphila, Neulasta, Nyvepria, UDENYCA, Ziextenzo What should I tell my health care provider before I take this medicine? They need to know if you have any of these conditions:  kidney disease  latex allergy  ongoing radiation therapy  sickle cell disease  skin reactions to acrylic adhesives (On-Body Injector only)  an unusual or allergic reaction to pegfilgrastim, filgrastim, other medicines, foods, dyes, or preservatives  pregnant or trying to get pregnant  breast-feeding How should I use this medicine? This medicine is for injection under the skin. If you get this medicine at home, you will be taught how to prepare and give the pre-filled syringe or how to use the On-body Injector. Refer to the patient Instructions for Use for detailed instructions. Use exactly as directed. Tell your healthcare provider immediately if you suspect that the On-body Injector may not have performed as intended or if you suspect the use of the On-body Injector resulted in a missed or partial dose. It is important that you put your used needles and syringes in a special sharps container. Do not put them in a trash can. If you do not have a sharps container, call your pharmacist or healthcare provider to get one. Talk to your pediatrician regarding the use of this medicine in children. While this drug  may be prescribed for selected conditions, precautions do apply. Overdosage: If you think you have taken too much of this medicine contact a poison control center or emergency room at once. NOTE: This medicine is only for you. Do not share this medicine with others. What if I miss a dose? It is important not to miss your dose. Call your doctor or health care professional if you miss your dose. If you miss a dose due to an On-body Injector failure or leakage, a new dose should be administered as soon as possible using a single prefilled syringe for manual use. What may interact with this medicine? Interactions have not been studied. This list may not describe all possible interactions. Give your health care provider a list of all the medicines, herbs, non-prescription drugs, or dietary supplements you use. Also tell them if you smoke, drink alcohol, or use illegal drugs. Some items may interact with your medicine. What should I watch for while using this medicine? Your condition will be monitored carefully while you are receiving this medicine. You may need blood work done while you are taking this medicine. Talk to your health care provider about your risk of cancer. You may be more at risk for certain types of cancer if you take this medicine. If you are going to need a MRI, CT scan, or other procedure, tell your doctor that you are using this medicine (On-Body Injector only). What side effects may I notice from receiving this medicine? Side effects that you should report to your doctor or health care professional as soon as possible:  allergic reactions (skin rash, itching or hives, swelling of   the face, lips, or tongue)  back pain  dizziness  fever  pain, redness, or irritation at site where injected  pinpoint red spots on the skin  red or dark-brown urine  shortness of breath or breathing problems  stomach or side pain, or pain at the shoulder  swelling  tiredness  trouble  passing urine or change in the amount of urine  unusual bruising or bleeding Side effects that usually do not require medical attention (report to your doctor or health care professional if they continue or are bothersome):  bone pain  muscle pain This list may not describe all possible side effects. Call your doctor for medical advice about side effects. You may report side effects to FDA at 1-800-FDA-1088. Where should I keep my medicine? Keep out of the reach of children. If you are using this medicine at home, you will be instructed on how to store it. Throw away any unused medicine after the expiration date on the label. NOTE: This sheet is a summary. It may not cover all possible information. If you have questions about this medicine, talk to your doctor, pharmacist, or health care provider.  2021 Elsevier/Gold Standard (2019-05-27 13:20:51)  

## 2020-07-26 NOTE — Progress Notes (Signed)
Pt d/c stable at 1610 

## 2020-07-27 ENCOUNTER — Other Ambulatory Visit: Payer: Self-pay | Admitting: Oncology

## 2020-07-27 DIAGNOSIS — C50912 Malignant neoplasm of unspecified site of left female breast: Secondary | ICD-10-CM

## 2020-08-02 ENCOUNTER — Telehealth: Payer: Self-pay

## 2020-08-02 NOTE — Telephone Encounter (Addendum)
Pt notified of Kelli's recommendations below. Pt verbalized understanding.    ----- Message from Marvia Pickles, PA-C sent at 08/02/2020  4:03 PM EDT ----- Regarding: RE: Pt on treatment, has cold s/s She can take OTC meds, but because of her symptoms she needs to be tested for COVID preferably checked by her PCP, to order additional tests as needed, and treated, before she can come into the cancer center and/or have any further treatment.  ----- Message ----- From: Dairl Ponder, RN Sent: 08/02/2020   3:59 PM EDT To: Marvia Pickles, PA-C Subject: Pt on treatment, has cold s/s                  Pt calls to ask what she can take for a cold. She has taken dayquil & nyquil caps. She has sore throat, voice is getting hoarse, occasional non productive cough. Afebrile. Denies sinus and chest congestion. "I just want to stay on my current treatment cycle, so I thought I'd call to see what I could take". Hasn't been tested for COVID, nor strept throat. Is completely vaccinated.  Please advise.

## 2020-08-07 ENCOUNTER — Other Ambulatory Visit: Payer: Self-pay | Admitting: Pharmacist

## 2020-08-10 ENCOUNTER — Encounter: Payer: Self-pay | Admitting: Hematology and Oncology

## 2020-08-10 ENCOUNTER — Telehealth: Payer: Self-pay | Admitting: Hematology and Oncology

## 2020-08-10 ENCOUNTER — Inpatient Hospital Stay (INDEPENDENT_AMBULATORY_CARE_PROVIDER_SITE_OTHER): Payer: Medicare Other | Admitting: Hematology and Oncology

## 2020-08-10 ENCOUNTER — Other Ambulatory Visit: Payer: Self-pay | Admitting: Hematology and Oncology

## 2020-08-10 ENCOUNTER — Inpatient Hospital Stay: Payer: Medicare Other

## 2020-08-10 ENCOUNTER — Other Ambulatory Visit: Payer: Self-pay

## 2020-08-10 DIAGNOSIS — C50911 Malignant neoplasm of unspecified site of right female breast: Secondary | ICD-10-CM

## 2020-08-10 DIAGNOSIS — C50912 Malignant neoplasm of unspecified site of left female breast: Secondary | ICD-10-CM

## 2020-08-10 HISTORY — DX: Hypomagnesemia: E83.42

## 2020-08-10 LAB — CBC AND DIFFERENTIAL
HCT: 33 — AB (ref 36–46)
Hemoglobin: 11 — AB (ref 12.0–16.0)
Neutrophils Absolute: 10.42
Platelets: 237 (ref 150–399)
WBC: 12.4

## 2020-08-10 LAB — HEPATIC FUNCTION PANEL
ALT: 42 — AB (ref 7–35)
AST: 41 — AB (ref 13–35)
Alkaline Phosphatase: 111 (ref 25–125)
Bilirubin, Total: 0.5

## 2020-08-10 LAB — BASIC METABOLIC PANEL
BUN: 11 (ref 4–21)
CO2: 31 — AB (ref 13–22)
Chloride: 101 (ref 99–108)
Creatinine: 0.6 (ref 0.5–1.1)
Glucose: 161
Potassium: 3.6 (ref 3.4–5.3)
Sodium: 138 (ref 137–147)

## 2020-08-10 LAB — COMPREHENSIVE METABOLIC PANEL
Albumin: 4.3 (ref 3.5–5.0)
Calcium: 9.4 (ref 8.7–10.7)

## 2020-08-10 LAB — MAGNESIUM: Magnesium: 1.5

## 2020-08-10 LAB — CBC: RBC: 3.46 — AB (ref 3.87–5.11)

## 2020-08-10 NOTE — Telephone Encounter (Signed)
Per 3/25 los next appt scheduled and given to patient

## 2020-08-10 NOTE — Progress Notes (Signed)
[place] [facility]  [address] [place],  [place]  [address] [phone]  Clinic Day:  08/10/2020  Referring physician: Delphina Cahill, FNP   CHIEF COMPLAINT:  CC:  Recurrent Her2 receptor positive breast cancer  Current Treatment:   TCHP (docetaxel/carboplatin/trastuzumab/pertuzumab ) every 3 weeks   HISTORY OF PRESENT ILLNESS:  Tammy Fowler is a 70 y.o. 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 Excisional biopsy revealed a 2 cm, grade 2, infiltrating ductal carcinoma in the left lower quadrant. Estrogen and progesterone receptors were positive and her 2 Neu negative. Tumor was present at the margins, so she underwent re-excision and axillary dissection. 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 Four of sixteen lymph nodes were positive for metastasis. All surgical margins were free of tumor involvement. She received adjuvant chemotherapy with with dose dense Adriamycin and Cytoxan for 4 cycles, followed by dose dense Taxol for 4 cycles, follwed by adjuvant radiation of the left breast. She was placed on anastrozole 1 mg daily in April 2005 and completed 10 years of therapy in April 2015. She underwent left latissimus dorsi muscle reconstruction of the left breast and right breast reduction. She underwent total abdominal hysterectomy and bilateral salpingo-oophorectomy prior to starting anastrozole. She had excision of a benign mass from the left breast in 2009. She underwent testing for BRCA mutations, including BART, in April 2013, which was negative, however, we did not have the results. She has a history of osteoporosis, previously treated with zolendronic acid every 6 months, which was discontinued when she completed anastrozole.   We began seeing her in July 2014, when she relocated to this area. Bone density  scan in December 2015 was normal. Her personal and family history wass suggestive of a hereditary cancer syndrome, so we recommended additional genetic testing with the Myriad myRisk Update Panel test to look for a mutation in other genes known to increase the risk for breast cancer and as well as other cancers. 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 These results were reviewed with her in detail and she was scheduled for an breast MRI which did not reveal any evidence of malignancy. She was unsure about undergoing prophylactic bilateral mastectomies. She was seen by Dr. Coralie Keens who referred her to Dr. Henrietta Hoover regarding reconstructive surgery. She was interested in reconstruction and the plastic surgeon that she saw discouraged her because of the increased risk of complications with surgery on previously irradiated tissue. The patient so far has continued close surveillance with annual mammogram and breast MRI spaced out 6 months. Bone density in January 2019was normal. Breast MRI in August 2019 did not reveal any evidence of malignancyShe developed diabetes and had an MRI abdomen, which did not reveal any evidence of pancreatic cancer. She  had colonoscopy in early 2020by Dr. Marcell Anger in [place] and had 2 polyps removed. Repeat colonoscopy in 5 years was recommended. Bilateral screening mammogram in March 2020 and April 2021 did not reveal any evidence of malignancy.  She was seen in January 2022 with a new palpable mass in the left breast.  Diagnostic left mammogram on January 12th revealed a suspicious mass measuring 2.0 cm at 10 o'clock, which accounted  for the patients palpable area of concern. Diagnostic right mammogram did not reveal any evidence of malignancy.  Ultrasound guided biopsy on January 19th  revealed poorly differentiated carcinoma in a background of adipose tissue with fibrosis and necrosis, consistent with breast origin. HER2 positive.  Estrogen receptor was 30% positive and progesterone receptor was negative.  Ki67 was 25%.  However, there was no breast tissue in the sample, so this was felt to be more likely a recurrence rather than a new breast primary, even though it has been many years. Her previous cancer was HER2 negative and ER positive. We recommended neoadjuvant chemotherapy/HER2 targeted therapy with docetaxel/carboplatin/trastuzumab/pertuzumab [facility]) which she started treatment with [facility] on February 15th.  She has experienced decreased appetite, taste changes, mouth sores, and diarrhea despite imodium. She was given a prescription for Lomotil to better control her diarrhea. MBX has been effective for her mouth sores  She also had heartburn for which omeprazole was effective..  She shaved her head prior to her 2nd cycle due to partial alopecia.  INTERVAL HISTORY:  Tammy Fowler is here today for repeat clinical assessment prior to a 3rd cycle of TCHP. She continues to report diarrhea 2-3 times daily. She states the Lomotil seems to be more effective, but she is taking 3 at a time. I advised her that the maximum dose of Lomotil is 2 tablets 4 times a day.  I advised her to alternate the Lomotil with the Imodium to control her diarrhea.  She reports persistent taste changes and decreased appetite and has lost weight again. Her weight has decreased 8 pounds over last 3 weeks.  She reports mild tingling and numbness of her fingers and toes, which is stable. She reports mild fatigue.  REVIEW OF SYSTEMS:  Review of Systems  Constitutional: Positive for appetite change, fatigue and unexpected weight change. Negative for chills and fever.  HENT:   Negative for lump/mass, mouth sores and sore throat.   Respiratory: Negative for cough and shortness of breath.   Cardiovascular: Negative for chest  pain and leg swelling.  Gastrointestinal: Positive for diarrhea. Negative for abdominal pain, constipation, nausea and vomiting.  Endocrine: Negative for hot flashes.  Genitourinary: Negative for difficulty urinating, dysuria, frequency and hematuria.   Musculoskeletal: Negative for arthralgias, back pain and myalgias.  Skin: Negative for rash.  Neurological: Positive for numbness (fingers and toes). Negative for dizziness and headaches.  Hematological: Negative for adenopathy. Does not bruise/bleed easily.  Psychiatric/Behavioral: Negative for depression and sleep disturbance. The patient is not nervous/anxious.      VITALS:  Blood pressure (!) 110/57, pulse (!) 103, temperature 98.4 F (36.9 C), temperature source Oral, resp. rate 16, height 5' 2"  (1.575 m), weight 132 lb 12.8 oz (60.2 kg), SpO2 95 %.  Wt Readings from Last 3 Encounters:  08/10/20 132 lb 12.8 oz (60.2 kg)  07/26/20 139 lb 1.3 oz (63.1 kg)  07/24/20 139 lb (63 kg)    Body mass index is 24.29 kg/m.  Performance status (ECOG): 1 - Symptomatic but completely ambulatory  PHYSICAL EXAM:  Physical Exam Vitals and nursing note reviewed.  Constitutional:      General: She is not in acute distress.    Appearance: Normal appearance.  HENT:     Head: Normocephalic and atraumatic.     Mouth/Throat:     Mouth: Mucous membranes are moist.     Pharynx: Oropharynx is clear. No oropharyngeal exudate or posterior oropharyngeal erythema.  Eyes:     General: No scleral icterus.  Extraocular Movements: Extraocular movements intact.     Conjunctiva/sclera: Conjunctivae normal.     Pupils: Pupils are equal, round, and reactive to light.  Cardiovascular:     Rate and Rhythm: Normal rate and regular rhythm.     Heart sounds: Normal heart sounds. No murmur heard. No friction rub. No gallop.   Pulmonary:     Effort: Pulmonary effort is normal.     Breath sounds: Normal breath sounds. No rhonchi or rales.  Chest:  Breasts:      Right: Normal. No axillary adenopathy or supraclavicular adenopathy.     Left: Mass present. No swelling, bleeding, inverted nipple, nipple discharge, skin change, tenderness, axillary adenopathy or supraclavicular adenopathy.      Comments: Persistent mass in the left upper breast measuring about 2.5 cm. Abdominal:     General: There is no distension.     Palpations: Abdomen is soft. There is no splenomegaly or mass.     Tenderness: There is no abdominal tenderness.  Musculoskeletal:        General: Normal range of motion.     Cervical back: Normal range of motion and neck supple. No tenderness.     Right lower leg: No edema.     Left lower leg: No edema.  Lymphadenopathy:     Cervical: No cervical adenopathy.     Upper Body:     Right upper body: No supraclavicular or axillary adenopathy.     Left upper body: No supraclavicular or axillary adenopathy.     Lower Body: No right inguinal adenopathy. No left inguinal adenopathy.  Skin:    General: Skin is warm and dry.     Coloration: Skin is not jaundiced.     Findings: No rash.  Neurological:     Mental Status: She is alert and oriented to person, place, and time.     Cranial Nerves: No cranial nerve deficit.  Psychiatric:        Mood and Affect: Mood normal.        Behavior: Behavior normal.        Thought Content: Thought content normal.    LABS:   CBC Latest Ref Rng & Units 08/10/2020 07/20/2020 07/13/2020  WBC - 12.4 8.2 12.2  Hemoglobin 12.0 - 16.0 11.0(A) 11.7(A) 12.1  Hematocrit 36 - 46 33(A) 35(A) 36  Platelets 150 - 399 237 238 205   CMP Latest Ref Rng & Units 08/10/2020 07/20/2020 07/13/2020  Glucose 70 - 99 mg/dL - - -  BUN 4 - 21 11 14 10   Creatinine 0.5 - 1.1 0.6 0.6 0.6  Sodium 137 - 147 138 140 137  Potassium 3.4 - 5.3 3.6 4.8 4.1  Chloride 99 - 108 101 103 99  CO2 13 - 22 31(A) 30(A) 31(A)  Calcium 8.7 - 10.7 9.4 10.1 9.8  Alkaline Phos 25 - 125 111 91 119  AST 13 - 35 41(A) 30 38(A)  ALT 7 - 35 42(A) 35  41(A)     No results found for: CEA1 / No results found for: CEA1 No results found for: PSA1 No results found for: MCN470 No results found for: JGG836  No results found for: TOTALPROTELP, ALBUMINELP, A1GS, A2GS, BETS, BETA2SER, GAMS, MSPIKE, SPEI No results found for: TIBC, FERRITIN, IRONPCTSAT No results found for: LDH  STUDIES:  No results found.    HISTORY:   Past Medical History:  Diagnosis Date  . Cancer ([place])   . Depression   . Diabetes mellitus without complication ([place])   .  Hyperlipidemia   . Hypomagnesemia 08/10/2020  . Sleep apnea     Past Surgical History:  Procedure Laterality Date  . ABDOMINAL HYSTERECTOMY    . BREAST SURGERY Left   . HYSTERECTOMY ABDOMINAL WITH SALPINGO-OOPHORECTOMY  07/2003   due to fibroids  . PORTACATH PLACEMENT Right 06/26/2020   Procedure: INSERTION PORT-A-CATH;  Surgeon: [name], MD;  Location: [facility];  Service: General;  Laterality: Right;  . TRIGGER FINGER RELEASE      Family History  Problem Relation Age of Onset  . Breast cancer Mother 49  . Breast cancer Maternal Aunt 25  . Ovarian cancer Maternal Grandmother 63  . Bone cancer Maternal Grandfather        58s  . Cancer Maternal Aunt 56    Social History:  reports that she has never smoked. She has never used smokeless tobacco. She reports current alcohol use. No history on file for drug use.The patient is alone today.  Allergies:  Allergies  Allergen Reactions  . Oxycodone Itching  . Codeine Other (See Comments)    Other reaction(s): Other (See Comments) Unknown Unknown   . Lisinopril     Other reaction(s): Cough (ALLERGY/intolerance)    Current Medications: Current Outpatient Medications  Medication Sig Dispense Refill  . Accu-Chek FastClix Lancets MISC Apply topically.    Marland Kitchen aspirin 81 MG chewable tablet Chew by mouth.    . benzonatate (TESSALON) 100 MG capsule     . calcium-vitamin D 250-100 MG-UNIT tablet Take by mouth.    .  celecoxib (CELEBREX) 100 MG capsule Take by mouth.    . dexamethasone (DECADRON) 4 MG tablet Take 2 tablets (8 mg total) by mouth 2 (two) times daily. Start the day before Taxotere. Then take daily x 3 days after chemotherapy. (Patient taking differently: Take 4 mg by mouth 2 (two) times daily. Start the day before Taxotere. Then take daily x 3 days after chemotherapy.) 30 tablet 1  . diphenoxylate-atropine (LOMOTIL) 2.5-0.025 MG tablet Take 2 tablets by mouth 4 (four) times daily as needed for diarrhea or loose stools. 60 tablet 3  . Docusate Sodium (DSS) 100 MG CAPS Take by mouth.    . DULoxetine (CYMBALTA) 60 MG capsule Take 1 capsule by mouth daily.    Marland Kitchen FLUoxetine (PROZAC) 40 MG capsule Take by mouth.    . fluticasone (FLONASE) 50 MCG/ACT nasal spray     . HYDROcodone-acetaminophen (NORCO) 7.5-325 MG tablet Take 1 tablet by mouth every 4 (four) hours as needed for moderate pain. 120 tablet 0  . lidocaine-prilocaine (EMLA) cream Apply to affected area once 30 g 3  . losartan (COZAAR) 50 MG tablet daily.    . magic mouthwash (nystatin, lidocaine, diphenhydrAMINE) suspension Take 5 mLs by mouth every 3 (three) hours as needed for mouth pain. Swish & Spit; [facility]m [facility] 1305 per Melissa P,NP    . metFORMIN (GLUCOPHAGE) 500 MG tablet 2 (two) times daily.    . Multiple Vitamins-Minerals (THERA-M) TABS Take by mouth.    . ondansetron (ZOFRAN) 8 MG tablet Take 1 tablet (8 mg total) by mouth 2 (two) times daily as needed (Nausea or vomiting). Start on the third day after chemotherapy. 30 tablet 1  . prochlorperazine (COMPAZINE) 10 MG tablet Take 1 tablet (10 mg total) by mouth every 6 (six) hours as needed (Nausea or vomiting). 30 tablet 1  . rosuvastatin (CRESTOR) 40 MG tablet daily.    . traMADol (ULTRAM) 50  MG tablet Take 1-2 tablets (50-100 mg total) by mouth every 6 (six) hours as needed. 30 tablet 0  . vitamin B-12 (CYANOCOBALAMIN) 500 MCG tablet Take  500 mcg by mouth daily.     No current facility-administered medications for this visit.     ASSESSMENT & PLAN:   Assessment/Plan:   1. New left breast mass in the left upper inner quadrant biopsy proven to be poorly differentiated carcinoma in a background of adipose tissue with fibrosis and necrosis, consistent with breast origin. HER2 positive.  This more likely a recurrence rather than a second breast primary. There really is no way to prove this and the slides from her prior cancer are no longer available.   She is receiving neoadjuvant TCHP and having difficulty tolerating this with anorexia, taste changes, weight loss, and diarrhea.  She will proceed with a 3rd cycle of TCHP next week.  2. Diarrhea, for which she is using Lomotil with some control.  I advised her to alternate Lomotil and Imodium to better control her diarrhea.  We also talked about decreasing fiber in her diet, as this may help.  3. Neuropathy, which is stable.    4. Mild hypomagnesemia.  We will hold off on oral magnesium due to her diarrhea. I gave her a list of magnesium containing foods to add to her diet.  We will plan to give her IV magnesium 2 g with her treatment next week.  5. Taste changes, decreased appetite and weight loss.  We talked about strategies for dealing with taste changes and encouraged her to try different foods in hopes of preventing further weight loss.   We will plan to see her back in 3 weeks with a CBC and comprehensive metabolic panel prior to a 4th cycle of TCHP. The patient understands the plans discussed today and is in agreement with them.  She knows to contact our office if she develops concerns prior to her next appointment.   I provided 30 minutes of face-to-face time during this this encounter and > 50% was spent counseling as documented under my assessment and plan.    Marvia Pickles, PA-C

## 2020-08-13 ENCOUNTER — Encounter: Payer: Self-pay | Admitting: Hematology and Oncology

## 2020-08-15 ENCOUNTER — Other Ambulatory Visit: Payer: Self-pay

## 2020-08-15 ENCOUNTER — Other Ambulatory Visit: Payer: Self-pay | Admitting: Hematology and Oncology

## 2020-08-15 ENCOUNTER — Inpatient Hospital Stay: Payer: Medicare Other

## 2020-08-15 VITALS — BP 130/65 | HR 84 | Temp 98.2°F | Resp 18 | Ht 62.0 in | Wt 134.8 lb

## 2020-08-15 DIAGNOSIS — Z5112 Encounter for antineoplastic immunotherapy: Secondary | ICD-10-CM | POA: Diagnosis not present

## 2020-08-15 DIAGNOSIS — C50911 Malignant neoplasm of unspecified site of right female breast: Secondary | ICD-10-CM

## 2020-08-15 MED ORDER — DEXAMETHASONE SODIUM PHOSPHATE 10 MG/ML IJ SOLN
8.0000 mg | Freq: Once | INTRAMUSCULAR | Status: AC
Start: 2020-08-15 — End: 2020-08-15
  Administered 2020-08-15: 8 mg via INTRAVENOUS

## 2020-08-15 MED ORDER — ACETAMINOPHEN 325 MG PO TABS
ORAL_TABLET | ORAL | Status: AC
  Filled 2020-08-15: qty 2

## 2020-08-15 MED ORDER — SODIUM CHLORIDE 0.9 % IV SOLN
75.0000 mg/m2 | Freq: Once | INTRAVENOUS | Status: AC
  Administered 2020-08-15: 120 mg via INTRAVENOUS
  Filled 2020-08-15: qty 12

## 2020-08-15 MED ORDER — CARBOPLATIN CHEMO INJECTION 600 MG/60ML
461.4000 mg | Freq: Once | INTRAVENOUS | Status: AC
Start: 2020-08-15 — End: 2020-08-15
  Administered 2020-08-15: 460 mg via INTRAVENOUS
  Filled 2020-08-15: qty 46

## 2020-08-15 MED ORDER — DIPHENHYDRAMINE HCL 50 MG/ML IJ SOLN
INTRAMUSCULAR | Status: AC
  Filled 2020-08-15: qty 1

## 2020-08-15 MED ORDER — TRASTUZUMAB-ANNS CHEMO 150 MG IV SOLR
6.0000 mg/kg | Freq: Once | INTRAVENOUS | Status: AC
  Administered 2020-08-15: 357 mg via INTRAVENOUS
  Filled 2020-08-15: qty 17

## 2020-08-15 MED ORDER — SODIUM CHLORIDE 0.9 % IV SOLN
Freq: Once | INTRAVENOUS | Status: DC
  Filled 2020-08-15: qty 250

## 2020-08-15 MED ORDER — DEXAMETHASONE SODIUM PHOSPHATE 10 MG/ML IJ SOLN
INTRAMUSCULAR | Status: AC
  Filled 2020-08-15: qty 1

## 2020-08-15 MED ORDER — MAGNESIUM SULFATE 2 GM/50ML IV SOLN
2.0000 g | Freq: Once | INTRAVENOUS | Status: AC
  Administered 2020-08-15: 2 g via INTRAVENOUS

## 2020-08-15 MED ORDER — PALONOSETRON HCL INJECTION 0.25 MG/5ML
0.2500 mg | Freq: Once | INTRAVENOUS | Status: AC
  Administered 2020-08-15: 0.25 mg via INTRAVENOUS

## 2020-08-15 MED ORDER — DIPHENHYDRAMINE HCL 50 MG/ML IJ SOLN
25.0000 mg | Freq: Once | INTRAMUSCULAR | Status: AC
Start: 2020-08-15 — End: 2020-08-15
  Administered 2020-08-15: 25 mg via INTRAVENOUS

## 2020-08-15 MED ORDER — HEPARIN SOD (PORK) LOCK FLUSH 100 UNIT/ML IV SOLN
500.0000 [IU] | Freq: Once | INTRAVENOUS | Status: AC | PRN
  Administered 2020-08-15: 500 [IU]
  Filled 2020-08-15: qty 5

## 2020-08-15 MED ORDER — PALONOSETRON HCL INJECTION 0.25 MG/5ML
INTRAVENOUS | Status: AC
  Filled 2020-08-15: qty 5

## 2020-08-15 MED ORDER — MAGNESIUM SULFATE 2 GM/50ML IV SOLN
INTRAVENOUS | Status: AC
  Filled 2020-08-15: qty 50

## 2020-08-15 MED ORDER — SODIUM CHLORIDE 0.9 % IV SOLN
420.0000 mg | Freq: Once | INTRAVENOUS | Status: AC
  Administered 2020-08-15: 420 mg via INTRAVENOUS
  Filled 2020-08-15: qty 14

## 2020-08-15 MED ORDER — SODIUM CHLORIDE 0.9 % IV SOLN
Freq: Once | INTRAVENOUS | Status: AC
  Filled 2020-08-15: qty 250

## 2020-08-15 MED ORDER — ACETAMINOPHEN 325 MG PO TABS
650.0000 mg | ORAL_TABLET | Freq: Once | ORAL | Status: AC
  Administered 2020-08-15: 650 mg via ORAL

## 2020-08-15 MED ORDER — SODIUM CHLORIDE 0.9 % IV SOLN
150.0000 mg | Freq: Once | INTRAVENOUS | Status: AC
  Administered 2020-08-15: 150 mg via INTRAVENOUS
  Filled 2020-08-15: qty 150

## 2020-08-15 NOTE — Progress Notes (Signed)
1623: PT STABLE AT TIME OF DISCHARGE

## 2020-08-15 NOTE — Patient Instructions (Signed)
[facility] Discharge Instructions for Patients Receiving Chemotherapy  Today you received the following chemotherapy agents Docetaxel, Carboplatin,Trastuzumab, Pertuzumab  To help prevent nausea and vomiting after your treatment, we encourage you to take your nausea medication as directed.   If you develop nausea and vomiting that is not controlled by your nausea medication, call the clinic.   BELOW ARE SYMPTOMS THAT SHOULD BE REPORTED IMMEDIATELY:  *FEVER GREATER THAN 100.5 F  *CHILLS WITH OR WITHOUT FEVER  NAUSEA AND VOMITING THAT IS NOT CONTROLLED WITH YOUR NAUSEA MEDICATION  *UNUSUAL SHORTNESS OF BREATH  *UNUSUAL BRUISING OR BLEEDING  TENDERNESS IN MOUTH AND THROAT WITH OR WITHOUT PRESENCE OF ULCERS  *URINARY PROBLEMS  *BOWEL PROBLEMS  UNUSUAL RASH Items with * indicate a potential emergency and should be followed up as soon as possible.  Feel free to call the clinic should you have any questions or concerns at The clinic phone number is [phone].  Please show the [facility] at check-in to the Emergency Department and triage nurse.

## 2020-08-17 ENCOUNTER — Inpatient Hospital Stay: Payer: Medicare Other | Attending: Oncology

## 2020-08-17 ENCOUNTER — Other Ambulatory Visit: Payer: Self-pay

## 2020-08-17 VITALS — BP 147/62 | HR 64 | Temp 98.1°F | Resp 18 | Ht 62.0 in | Wt 139.0 lb

## 2020-08-17 DIAGNOSIS — Z170 Estrogen receptor positive status [ER+]: Secondary | ICD-10-CM | POA: Insufficient documentation

## 2020-08-17 DIAGNOSIS — Z90722 Acquired absence of ovaries, bilateral: Secondary | ICD-10-CM | POA: Diagnosis not present

## 2020-08-17 DIAGNOSIS — Z5111 Encounter for antineoplastic chemotherapy: Secondary | ICD-10-CM | POA: Insufficient documentation

## 2020-08-17 DIAGNOSIS — Z803 Family history of malignant neoplasm of breast: Secondary | ICD-10-CM | POA: Diagnosis not present

## 2020-08-17 DIAGNOSIS — G629 Polyneuropathy, unspecified: Secondary | ICD-10-CM | POA: Diagnosis not present

## 2020-08-17 DIAGNOSIS — N6322 Unspecified lump in the left breast, upper inner quadrant: Secondary | ICD-10-CM | POA: Diagnosis not present

## 2020-08-17 DIAGNOSIS — Z7952 Long term (current) use of systemic steroids: Secondary | ICD-10-CM | POA: Diagnosis not present

## 2020-08-17 DIAGNOSIS — C50911 Malignant neoplasm of unspecified site of right female breast: Secondary | ICD-10-CM

## 2020-08-17 DIAGNOSIS — E860 Dehydration: Secondary | ICD-10-CM | POA: Diagnosis not present

## 2020-08-17 DIAGNOSIS — Z5189 Encounter for other specified aftercare: Secondary | ICD-10-CM | POA: Diagnosis not present

## 2020-08-17 DIAGNOSIS — R197 Diarrhea, unspecified: Secondary | ICD-10-CM | POA: Diagnosis not present

## 2020-08-17 DIAGNOSIS — Z90710 Acquired absence of both cervix and uterus: Secondary | ICD-10-CM | POA: Insufficient documentation

## 2020-08-17 DIAGNOSIS — C50212 Malignant neoplasm of upper-inner quadrant of left female breast: Secondary | ICD-10-CM | POA: Insufficient documentation

## 2020-08-17 DIAGNOSIS — E876 Hypokalemia: Secondary | ICD-10-CM | POA: Insufficient documentation

## 2020-08-17 DIAGNOSIS — Z7984 Long term (current) use of oral hypoglycemic drugs: Secondary | ICD-10-CM | POA: Insufficient documentation

## 2020-08-17 DIAGNOSIS — Z808 Family history of malignant neoplasm of other organs or systems: Secondary | ICD-10-CM | POA: Diagnosis not present

## 2020-08-17 DIAGNOSIS — Z8041 Family history of malignant neoplasm of ovary: Secondary | ICD-10-CM | POA: Insufficient documentation

## 2020-08-17 DIAGNOSIS — Z5112 Encounter for antineoplastic immunotherapy: Secondary | ICD-10-CM | POA: Insufficient documentation

## 2020-08-17 DIAGNOSIS — Z7982 Long term (current) use of aspirin: Secondary | ICD-10-CM | POA: Diagnosis not present

## 2020-08-17 MED ORDER — PEGFILGRASTIM-BMEZ 6 MG/0.6ML ~~LOC~~ SOSY
6.0000 mg | PREFILLED_SYRINGE | Freq: Once | SUBCUTANEOUS | Status: AC
  Administered 2020-08-17: 6 mg via SUBCUTANEOUS

## 2020-08-17 MED ORDER — PEGFILGRASTIM-BMEZ 6 MG/0.6ML ~~LOC~~ SOSY
PREFILLED_SYRINGE | SUBCUTANEOUS | Status: AC
  Filled 2020-08-17: qty 0.6

## 2020-08-17 NOTE — Progress Notes (Signed)
Pt d/c stable at 1606 

## 2020-08-17 NOTE — Patient Instructions (Signed)
Pegfilgrastim injection What is this medicine? PEGFILGRASTIM (PEG fil gra stim) is a long-acting granulocyte colony-stimulating factor that stimulates the growth of neutrophils, a type of white blood cell important in the body's fight against infection. It is used to reduce the incidence of fever and infection in patients with certain types of cancer who are receiving chemotherapy that affects the bone marrow, and to increase survival after being exposed to high doses of radiation. This medicine may be used for other purposes; ask your health care provider or pharmacist if you have questions. COMMON BRAND NAME(S): Fulphila, Neulasta, Nyvepria, UDENYCA, Ziextenzo What should I tell my health care provider before I take this medicine? They need to know if you have any of these conditions:  kidney disease  latex allergy  ongoing radiation therapy  sickle cell disease  skin reactions to acrylic adhesives (On-Body Injector only)  an unusual or allergic reaction to pegfilgrastim, filgrastim, other medicines, foods, dyes, or preservatives  pregnant or trying to get pregnant  breast-feeding How should I use this medicine? This medicine is for injection under the skin. If you get this medicine at home, you will be taught how to prepare and give the pre-filled syringe or how to use the On-body Injector. Refer to the patient Instructions for Use for detailed instructions. Use exactly as directed. Tell your healthcare provider immediately if you suspect that the On-body Injector may not have performed as intended or if you suspect the use of the On-body Injector resulted in a missed or partial dose. It is important that you put your used needles and syringes in a special sharps container. Do not put them in a trash can. If you do not have a sharps container, call your pharmacist or healthcare provider to get one. Talk to your pediatrician regarding the use of this medicine in children. While this drug  may be prescribed for selected conditions, precautions do apply. Overdosage: If you think you have taken too much of this medicine contact a poison control center or emergency room at once. NOTE: This medicine is only for you. Do not share this medicine with others. What if I miss a dose? It is important not to miss your dose. Call your doctor or health care professional if you miss your dose. If you miss a dose due to an On-body Injector failure or leakage, a new dose should be administered as soon as possible using a single prefilled syringe for manual use. What may interact with this medicine? Interactions have not been studied. This list may not describe all possible interactions. Give your health care provider a list of all the medicines, herbs, non-prescription drugs, or dietary supplements you use. Also tell them if you smoke, drink alcohol, or use illegal drugs. Some items may interact with your medicine. What should I watch for while using this medicine? Your condition will be monitored carefully while you are receiving this medicine. You may need blood work done while you are taking this medicine. Talk to your health care provider about your risk of cancer. You may be more at risk for certain types of cancer if you take this medicine. If you are going to need a MRI, CT scan, or other procedure, tell your doctor that you are using this medicine (On-Body Injector only). What side effects may I notice from receiving this medicine? Side effects that you should report to your doctor or health care professional as soon as possible:  allergic reactions (skin rash, itching or hives, swelling of   the face, lips, or tongue)  back pain  dizziness  fever  pain, redness, or irritation at site where injected  pinpoint red spots on the skin  red or dark-brown urine  shortness of breath or breathing problems  stomach or side pain, or pain at the shoulder  swelling  tiredness  trouble  passing urine or change in the amount of urine  unusual bruising or bleeding Side effects that usually do not require medical attention (report to your doctor or health care professional if they continue or are bothersome):  bone pain  muscle pain This list may not describe all possible side effects. Call your doctor for medical advice about side effects. You may report side effects to FDA at 1-800-FDA-1088. Where should I keep my medicine? Keep out of the reach of children. If you are using this medicine at home, you will be instructed on how to store it. Throw away any unused medicine after the expiration date on the label. NOTE: This sheet is a summary. It may not cover all possible information. If you have questions about this medicine, talk to your doctor, pharmacist, or health care provider.  2021 Elsevier/Gold Standard (2019-05-27 13:20:51)  

## 2020-08-23 ENCOUNTER — Inpatient Hospital Stay: Payer: Medicare Other

## 2020-08-23 ENCOUNTER — Other Ambulatory Visit: Payer: Self-pay

## 2020-08-23 ENCOUNTER — Telehealth: Payer: Self-pay

## 2020-08-23 ENCOUNTER — Encounter: Payer: Self-pay | Admitting: Hematology and Oncology

## 2020-08-23 ENCOUNTER — Inpatient Hospital Stay (INDEPENDENT_AMBULATORY_CARE_PROVIDER_SITE_OTHER): Payer: Medicare Other | Admitting: Hematology and Oncology

## 2020-08-23 ENCOUNTER — Other Ambulatory Visit: Payer: Self-pay | Admitting: Hematology and Oncology

## 2020-08-23 VITALS — BP 113/63 | HR 104 | Temp 98.1°F | Resp 18

## 2020-08-23 VITALS — BP 106/56 | HR 95 | Temp 98.7°F | Resp 18 | Ht 62.0 in | Wt 129.5 lb

## 2020-08-23 DIAGNOSIS — C50911 Malignant neoplasm of unspecified site of right female breast: Secondary | ICD-10-CM | POA: Diagnosis not present

## 2020-08-23 DIAGNOSIS — E860 Dehydration: Secondary | ICD-10-CM | POA: Diagnosis not present

## 2020-08-23 DIAGNOSIS — Z5112 Encounter for antineoplastic immunotherapy: Secondary | ICD-10-CM | POA: Diagnosis not present

## 2020-08-23 DIAGNOSIS — E876 Hypokalemia: Secondary | ICD-10-CM | POA: Diagnosis not present

## 2020-08-23 LAB — CBC
MCV: 97 (ref 81–99)
RBC: 3.33 — AB (ref 3.87–5.11)

## 2020-08-23 LAB — BASIC METABOLIC PANEL
BUN: 20 (ref 4–21)
CO2: 28 — AB (ref 13–22)
Chloride: 96 — AB (ref 99–108)
Creatinine: 1.9 — AB (ref 0.5–1.1)
Glucose: 154
Potassium: 3.3 — AB (ref 3.4–5.3)
Sodium: 134 — AB (ref 137–147)

## 2020-08-23 LAB — HEPATIC FUNCTION PANEL
ALT: 22 (ref 7–35)
AST: 24 (ref 13–35)
Alkaline Phosphatase: 153 — AB (ref 25–125)

## 2020-08-23 LAB — COMPREHENSIVE METABOLIC PANEL
Albumin: 4.3 (ref 3.5–5.0)
Calcium: 9.3 (ref 8.7–10.7)

## 2020-08-23 LAB — CBC AND DIFFERENTIAL
HCT: 32 — AB (ref 36–46)
Hemoglobin: 10.4 — AB (ref 12.0–16.0)
Neutrophils Absolute: 19.45
Platelets: 204 (ref 150–399)
WBC: 22.1

## 2020-08-23 LAB — MAGNESIUM: Magnesium: 1.4 — AB (ref 1.6–2.3)

## 2020-08-23 MED ORDER — MAGNESIUM SULFATE 4 GM/100ML IV SOLN
4.0000 g | Freq: Once | INTRAVENOUS | Status: AC
  Administered 2020-08-23: 4 g via INTRAVENOUS

## 2020-08-23 MED ORDER — SODIUM CHLORIDE 0.9 % IV SOLN
Freq: Once | INTRAVENOUS | Status: DC
  Filled 2020-08-23: qty 250

## 2020-08-23 MED ORDER — SODIUM CHLORIDE 0.9 % IV SOLN
Freq: Once | INTRAVENOUS | Status: AC
  Filled 2020-08-23: qty 250

## 2020-08-23 MED ORDER — HEPARIN SOD (PORK) LOCK FLUSH 100 UNIT/ML IV SOLN
500.0000 [IU] | Freq: Once | INTRAVENOUS | Status: AC | PRN
  Administered 2020-08-23: 500 [IU]
  Filled 2020-08-23: qty 5

## 2020-08-23 MED ORDER — MAGNESIUM SULFATE 4 GM/100ML IV SOLN
INTRAVENOUS | Status: AC
  Filled 2020-08-23: qty 100

## 2020-08-23 MED ORDER — POTASSIUM CHLORIDE 10 MEQ/100ML IV SOLN
10.0000 meq | INTRAVENOUS | Status: AC
  Administered 2020-08-23 (×2): 10 meq via INTRAVENOUS

## 2020-08-23 MED ORDER — POTASSIUM CHLORIDE 10 MEQ/100ML IV SOLN
INTRAVENOUS | Status: AC
  Filled 2020-08-23: qty 200

## 2020-08-23 NOTE — Patient Instructions (Addendum)
Dehydration, Adult Dehydration is condition in which there is not enough water or other fluids in the body. This happens when a person loses more fluids than he or she takes in. Important body parts cannot work right without the right amount of fluids. Any loss of fluids from the body can cause dehydration. Dehydration can be mild, worse, or very bad. It should be treated right away to keep it from getting very bad. What are the causes? This condition may be caused by:  Conditions that cause loss of water or other fluids, such as: ? Watery poop (diarrhea). ? Vomiting. ? Sweating a lot. ? Peeing (urinating) a lot.  Not drinking enough fluids, especially when you: ? Are ill. ? Are doing things that take a lot of energy to do.  Other illnesses and conditions, such as fever or infection.  Certain medicines, such as medicines that take extra fluid out of the body (diuretics).  Lack of safe drinking water.  Not being able to get enough water and food. What increases the risk? The following factors may make you more likely to develop this condition:  Having a long-term (chronic) illness that has not been treated the right way, such as: ? Diabetes. ? Heart disease. ? Kidney disease.  Being 65 years of age or older.  Having a disability.  Living in a place that is high above the ground or sea (high in altitude). The thinner, dried air causes more fluid loss.  Doing exercises that put stress on your body for a long time. What are the signs or symptoms? Symptoms of dehydration depend on how bad it is. Mild or worse dehydration  Thirst.  Dry lips or dry mouth.  Feeling dizzy or light-headed, especially when you stand up from sitting.  Muscle cramps.  Your body making: ? Dark pee (urine). Pee may be the color of tea. ? Less pee than normal. ? Less tears than normal.  Headache. Very bad dehydration  Changes in skin. Skin may: ? Be cold to the touch (clammy). ? Be blotchy  or pale. ? Not go back to normal right after you lightly pinch it and let it go.  Little or no tears, pee, or sweat.  Changes in vital signs, such as: ? Fast breathing. ? Low blood pressure. ? Weak pulse. ? Pulse that is more than 100 beats a minute when you are sitting still.  Other changes, such as: ? Feeling very thirsty. ? Eyes that look hollow (sunken). ? Cold hands and feet. ? Being mixed up (confused). ? Being very tired (lethargic) or having trouble waking from sleep. ? Short-term weight loss. ? Loss of consciousness. How is this treated? Treatment for this condition depends on how bad it is. Treatment should start right away. Do not wait until your condition gets very bad. Very bad dehydration is an emergency. You will need to go to a hospital.  Mild or worse dehydration can be treated at home. You may be asked to: ? Drink more fluids. ? Drink an oral rehydration solution (ORS). This drink helps get the right amounts of fluids and salts and minerals in the blood (electrolytes).  Very bad dehydration can be treated: ? With fluids through an IV tube. ? By getting normal levels of salts and minerals in your blood. This is often done by giving salts and minerals through a tube. The tube is passed through your nose and into your stomach. ? By treating the root cause. Follow these instructions at   home: Oral rehydration solution If told by your doctor, drink an ORS:  Make an ORS. Use instructions on the package.  Start by drinking small amounts, about  cup (120 mL) every 5-10 minutes.  Slowly drink more until you have had the amount that your doctor said to have. Eating and drinking  Drink enough clear fluid to keep your pee pale yellow. If you were told to drink an ORS, finish the ORS first. Then, start slowly drinking other clear fluids. Drink fluids such as: ? Water. Do not drink only water. Doing that can make the salt (sodium) level in your body get too low. ? Water  from ice chips you suck on. ? Fruit juice that you have added water to (diluted). ? Low-calorie sports drinks.  Eat foods that have the right amounts of salts and minerals, such as: ? Bananas. ? Oranges. ? Potatoes. ? Tomatoes. ? Spinach.  Do not drink alcohol.  Avoid: ? Drinks that have a lot of sugar. These include:  High-calorie sports drinks.  Fruit juice that you did not add water to.  Soda.  Caffeine. ? Foods that are greasy or have a lot of fat or sugar.         General instructions  Take over-the-counter and prescription medicines only as told by your doctor.  Do not take salt tablets. Doing that can make the salt level in your body get too high.  Return to your normal activities as told by your doctor. Ask your doctor what activities are safe for you.  Keep all follow-up visits as told by your doctor. This is important. Contact a doctor if:  You have pain in your belly (abdomen) and the pain: ? Gets worse. ? Stays in one place.  You have a rash.  You have a stiff neck.  You get angry or annoyed (irritable) more easily than normal.  You are more tired or have a harder time waking than normal.  You feel: ? Weak or dizzy. ? Very thirsty. Get help right away if you have:  Any symptoms of very bad dehydration.  Symptoms of vomiting, such as: ? You cannot eat or drink without vomiting. ? Your vomiting gets worse or does not go away. ? Your vomit has blood or green stuff in it.  Symptoms that get worse with treatment.  A fever.  A very bad headache.  Problems with peeing or pooping (having a bowel movement), such as: ? Watery poop that gets worse or does not go away. ? Blood in your poop (stool). This may cause poop to look black and tarry. ? Not peeing in 6-8 hours. ? Peeing only a small amount of very dark pee in 6-8 hours.  Trouble breathing. These symptoms may be an emergency. Do not wait to see if the symptoms will go away. Get  medical help right away. Call your local emergency services (911 in the [place] Do not drive yourself to the hospital. Summary  Dehydration is a condition in which there is not enough water or other fluids in the body. This happens when a person loses more fluids than he or she takes in.  Treatment for this condition depends on how bad it is. Treatment should be started right away. Do not wait until your condition gets very bad.  Drink enough clear fluid to keep your pee pale yellow. If you were told to drink an oral rehydration solution (ORS), finish the ORS first. Then, start slowly drinking other clear fluids.    Take over-the-counter and prescription medicines only as told by your doctor.  Get help right away if you have any symptoms of very bad dehydration. This information is not intended to replace advice given to you by your health care provider. Make sure you discuss any questions you have with your health care provider. Document Revised: 12/16/2018 Document Reviewed: 12/16/2018 Elsevier Patient Education  2021 [place]. Potassium chloride injection What is this medicine? POTASSIUM CHLORIDE (poe TASS i um KLOOR ide) is a potassium supplement used to prevent and to treat low potassium. Potassium is important for the heart, muscles, and nerves. Too much or too little potassium in the body can cause serious problems. This medicine may be used for other purposes; ask your health care provider or pharmacist if you have questions. COMMON BRAND NAME(S): PROAMP What should I tell my health care provider before I take this medicine? They need to know if you have any of these conditions:  Addison disease  dehydration  diabetes (high blood sugar)  heart disease  high levels of potassium in the blood  irregular heartbeat or rhythm  kidney disease  large areas of burned skin  an unusual or allergic reaction to potassium, other medicines, foods, dyes, or preservatives  pregnant  or trying to get pregnant  breast-feeding How should I use this medicine? This medicine is injected into a vein. It is given by a health care provider in a hospital or clinic setting. Talk to your health care provider about the use of this medicine in children. Special care may be needed. Overdosage: If you think you have taken too much of this medicine contact a poison control center or emergency room at once. NOTE: This medicine is only for you. Do not share this medicine with others. What if I miss a dose? This does not apply. This medicine is not for regular use. What may interact with this medicine? Do not take this medicine with any of the following medications:  certain diuretics such as spironolactone, triamterene  eplerenone  sodium polystyrene sulfonate This medicine may also interact with the following medications:  certain medicines for blood pressure or heart disease like lisinopril, losartan, quinapril, valsartan  medicines that lower your chance of fighting infection such as cyclosporine, tacrolimus  NSAIDs, medicines for pain and inflammation, like ibuprofen or naproxen  other potassium supplements  salt substitutes This list may not describe all possible interactions. Give your health care provider a list of all the medicines, herbs, non-prescription drugs, or dietary supplements you use. Also tell them if you smoke, drink alcohol, or use illegal drugs. Some items may interact with your medicine. What should I watch for while using this medicine? Visit your health care provider for regular checks on your progress. Tell your health care provider if your symptoms do not start to get better or if they get worse. You may need blood work while you are taking this medicine. Avoid salt substitutes unless you are told otherwise by your health care provider. What side effects may I notice from receiving this medicine? Side effects that you should report to your doctor or  health care professional as soon as possible:  allergic reactions (skin rash, itching, hives, swelling of the face, lips, tongue, or throat)  confusion  high potassium levels (muscle weakness, fast or irregular heartbeat)  low blood pressure (dizziness, feeling faint or lightheaded, blurry vision)  pain, tingling, or numbness in lips, hands, or feet  pain, redness, or irritation at site where injected  trouble breathing Side effects that usually do not require medical attention (report to your doctor or health care professional if they continue or are bothersome):  diarrhea  nausea, vomiting  passing gas  stomach pain This list may not describe all possible side effects. Call your doctor for medical advice about side effects. You may report side effects to FDA at 1-800-FDA-1088. Where should I keep my medicine? This medicine is given in a hospital or clinic. It will not be stored at home. NOTE: This sheet is a summary. It may not cover all possible information. If you have questions about this medicine, talk to your doctor, pharmacist, or health care provider.  2021 Elsevier/Gold Standard (2019-03-03 18:18:09) Magnesium Sulfate injection What is this medicine? MAGNESIUM SULFATE (mag NEE zee um SUL fate) is an electrolyte injection commonly used to treat low magnesium levels in your blood. It is also used to prevent or control seizures in women with preeclampsia or eclampsia. This medicine may be used for other purposes; ask your health care provider or pharmacist if you have questions. What should I tell my health care provider before I take this medicine? They need to know if you have any of these conditions:  heart disease  history of irregular heart beat  kidney disease  an unusual or allergic reaction to magnesium sulfate, medicines, foods, dyes, or preservatives  pregnant or trying to get pregnant  breast-feeding How should I use this medicine? This medicine is  for infusion into a vein. It is given by a health care professional in a hospital or clinic setting. Talk to your pediatrician regarding the use of this medicine in children. While this drug may be prescribed for selected conditions, precautions do apply. Overdosage: If you think you have taken too much of this medicine contact a poison control center or emergency room at once. NOTE: This medicine is only for you. Do not share this medicine with others. What if I miss a dose? This does not apply. What may interact with this medicine? This medicine may interact with the following medications:  certain medicines for anxiety or sleep  certain medicines for seizures like phenobarbital  digoxin  medicines that relax muscles for surgery  narcotic medicines for pain This list may not describe all possible interactions. Give your health care provider a list of all the medicines, herbs, non-prescription drugs, or dietary supplements you use. Also tell them if you smoke, drink alcohol, or use illegal drugs. Some items may interact with your medicine. What should I watch for while using this medicine? Your condition will be monitored carefully while you are receiving this medicine. You may need blood work done while you are receiving this medicine. What side effects may I notice from receiving this medicine? Side effects that you should report to your doctor or health care professional as soon as possible:  allergic reactions like skin rash, itching or hives, swelling of the face, lips, or tongue  facial flushing  muscle weakness  signs and symptoms of low blood pressure like dizziness; feeling faint or lightheaded, falls; unusually weak or tired  signs and symptoms of a dangerous change in heartbeat or heart rhythm like chest pain; dizziness; fast or irregular heartbeat; palpitations; breathing problems  sweating This list may not describe all possible side effects. Call your doctor for  medical advice about side effects. You may report side effects to FDA at 1-800-FDA-1088. Where should I keep my medicine? This drug is given in a hospital or clinic and  will not be stored at home. NOTE: This sheet is a summary. It may not cover all possible information. If you have questions about this medicine, talk to your doctor, pharmacist, or health care provider.  2021 Elsevier/Gold Standard (2015-11-21 12:31:42)  Diarrhea, Adult Diarrhea is when you pass loose and watery poop (stool) often. Diarrhea can make you feel weak and cause you to lose water in your body (get dehydrated). Losing water in your body can cause you to:  Feel tired and thirsty.  Have a dry mouth.  Go pee (urinate) less often. Diarrhea often lasts 2-3 days. However, it can last longer if it is a sign of something more serious. It is important to treat your diarrhea as told by your doctor. Follow these instructions at home: Eating and drinking Follow these instructions as told by your doctor:  Take an ORS (oral rehydration solution). This is a drink that helps you replace fluids and minerals your body lost. It is sold at pharmacies and stores.  Drink plenty of fluids, such as: ? Water. ? Ice chips. ? Diluted fruit juice. ? Low-calorie sports drinks. ? Milk, if you want.  Avoid drinking fluids that have a lot of sugar or caffeine in them.  Eat bland, easy-to-digest foods in small amounts as you are able. These foods include: ? Bananas. ? Applesauce. ? Rice. ? Low-fat (lean) meats. ? Toast. ? Crackers.  Avoid alcohol.  Avoid spicy or fatty foods.      Medicines  Take over-the-counter and prescription medicines only as told by your doctor.  If you were prescribed an antibiotic medicine, take it as told by your doctor. Do not stop using the antibiotic even if you start to feel better. General instructions  Wash your hands often using soap and water. If soap and water are not available, use a  hand sanitizer. Others in your home should wash their hands as well. Hands should be washed: ? After using the toilet or changing a diaper. ? Before preparing, cooking, or serving food. ? While caring for a sick person. ? While visiting someone in a hospital.  Drink enough fluid to keep your pee (urine) pale yellow.  Rest at home while you get better.  Watch your condition for any changes.  Take a warm bath to help with any burning or pain from having diarrhea.  Keep all follow-up visits as told by your doctor. This is important.   Contact a doctor if:  You have a fever.  Your diarrhea gets worse.  You have new symptoms.  You cannot keep fluids down.  You feel light-headed or dizzy.  You have a headache.  You have muscle cramps. Get help right away if:  You have chest pain.  You feel very weak or you pass out (faint).  You have bloody or black poop or poop that looks like tar.  You have very bad pain, cramping, or bloating in your belly (abdomen).  You have trouble breathing or you are breathing very quickly.  Your heart is beating very quickly.  Your skin feels cold and clammy.  You feel confused.  You have signs of losing too much water in your body, such as: ? Dark pee, very little pee, or no pee. ? Cracked lips. ? Dry mouth. ? Sunken eyes. ? Sleepiness. ? Weakness. Summary  Diarrhea is when you pass loose and watery poop (stool) often.  Diarrhea can make you feel weak and cause you to lose water in your body (get  dehydrated).  Take an ORS (oral rehydration solution). This is a drink that is sold at pharmacies and stores.  Eat bland, easy-to-digest foods in small amounts as you are able.  Contact a doctor if your condition gets worse. Get help right away if you have signs that you have lost too much water in your body. This information is not intended to replace advice given to you by your health care provider. Make sure you discuss any questions  you have with your health care provider. Document Revised: 10/09/2017 Document Reviewed: 10/09/2017 Elsevier Patient Education  2021 [employer].

## 2020-08-23 NOTE — Progress Notes (Signed)
[facility]  [address] [place],  [place]  [address] [phone]  Clinic Day:  08/23/2020  Referring physician: Delphina Cahill, FNP   CHIEF COMPLAINT:  CC:  Recurrent Her2 receptor positive breast cancer  Current Treatment:   TCHP (docetaxel/carboplatin/trastuzumab/pertuzumab ) every 3 weeks   HISTORY OF PRESENT ILLNESS:  Tammy Fowler is a 71 y.o. 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 Excisional biopsy revealed a 2 cm, grade 2, infiltrating ductal carcinoma in the left lower quadrant. Estrogen and progesterone receptors were positive and her 2 Neu negative. Tumor was present at the margins, so she underwent re-excision and axillary dissection. 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 Four of sixteen lymph nodes were positive for metastasis. All surgical margins were free of tumor involvement. She received adjuvant chemotherapy with with dose dense Adriamycin and Cytoxan for 4 cycles, followed by dose dense Taxol for 4 cycles, follwed by adjuvant radiation of the left breast. She was placed on anastrozole 1 mg daily in April 2005 and completed 10 years of therapy in April 2015. She underwent left latissimus dorsi muscle reconstruction of the left breast and right breast reduction. She underwent total abdominal hysterectomy and bilateral salpingo-oophorectomy prior to starting anastrozole. She had excision of a benign mass from the left breast in 2009. She underwent testing for BRCA mutations, including BART, in April 2013, which was negative, however, we did not have the results. She has a history of osteoporosis, previously treated with zolendronic acid every 6 months, which was discontinued when she completed anastrozole.   We began seeing her in July 2014, when she relocated to this area. Bone density scan  in December 2015 was normal. Her personal and family history wass suggestive of a hereditary cancer syndrome, so we recommended additional genetic testing with the Myriad myRisk Update Panel test to look for a mutation in other genes known to increase the risk for breast cancer and as well as other cancers. 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 These results were reviewed with her in detail and she was scheduled for an breast MRI which did not reveal any evidence of malignancy. She was unsure about undergoing prophylactic bilateral mastectomies. She was seen by Dr. Coralie Keens who referred her to Dr. Henrietta Hoover regarding reconstructive surgery. She was interested in reconstruction and the plastic surgeon that she saw discouraged her because of the increased risk of complications with surgery on previously irradiated tissue. The patient so far has continued close surveillance with annual mammogram and breast MRI spaced out 6 months. Bone density in January 2019was normal. Breast MRI in August 2019 did not reveal any evidence of malignancyShe developed diabetes and had an MRI abdomen, which did not reveal any evidence of pancreatic cancer. She  had colonoscopy in early 2020by Dr. Marcell Anger in [place] and had 2 polyps removed. Repeat colonoscopy in 5 years was recommended. Bilateral screening mammogram in March 2020 and April 2021 did not reveal any evidence of malignancy.  She was seen in January 2022 with a new palpable mass in the left breast.  Diagnostic left mammogram on January 12th revealed a suspicious mass measuring 2.0 cm at 10 o'clock, which accounted  for the patients palpable area of concern. Diagnostic right mammogram did not reveal any evidence of malignancy.  Ultrasound guided biopsy on January 19th revealed  poorly differentiated carcinoma in a background of adipose tissue with fibrosis and necrosis, consistent with breast origin. HER2 positive.  Estrogen receptor was 30% positive and progesterone receptor was negative.  Ki67 was 25%.  However, there was no breast tissue in the sample, so this was felt to be more likely a recurrence rather than a new breast primary, even though it has been many years. Her previous cancer was HER2 negative and ER positive. We recommended neoadjuvant chemotherapy/HER2 targeted therapy with docetaxel/carboplatin/trastuzumab/pertuzumab [facility]) which she started treatment with [facility] on February 15th.  She has experienced decreased appetite, taste changes, mouth sores, and diarrhea despite imodium. She was given a prescription for Lomotil to better control her diarrhea. MBX has been effective for her mouth sores  She also had heartburn for which omeprazole was effective.  She received a 3rd cycle of TCHP on March 30th.  At that time she continued to report diarrhea, but was not alternating Lomotil and Imodium.  She had lost weight, as she was not eating well due to taste changes.  INTERVAL HISTORY:  Tammy Fowler is added to the schedule today as her husband telephoned stating that she is weak and dizzy with episodes of passing out. She has not sought medical care after passing out.  She denies other neurologic symptoms such as headaches, blurry vision, paresthesias or focal weakness. She has developed recurrent mouth sore since her last visit. She has persistent diarrhea despite Lomotil. She is still not eating or drinking well due to taste changes and mouth sores. Her weight has decreased 10 pounds over last 1 week.   REVIEW OF SYSTEMS:  Review of Systems  Constitutional: Positive for appetite change, fatigue and unexpected weight change. Negative for chills and fever.  HENT:   Positive for mouth sores. Negative for lump/mass and sore throat.   Respiratory: Negative for cough and shortness  of breath.   Cardiovascular: Negative for chest pain and leg swelling.  Gastrointestinal: Positive for diarrhea. Negative for abdominal pain, constipation, nausea and vomiting.  Endocrine: Negative for hot flashes.  Genitourinary: Negative for difficulty urinating, dysuria, frequency and hematuria.   Musculoskeletal: Negative for arthralgias, back pain and myalgias.  Skin: Negative for rash.  Neurological: Positive for dizziness, light-headedness and numbness (Stable peripheral neuropathy). Negative for headaches.  Hematological: Negative for adenopathy. Does not bruise/bleed easily.  Psychiatric/Behavioral: Negative for depression and sleep disturbance. The patient is not nervous/anxious.      VITALS:  Blood pressure (!) 106/56, pulse 95, temperature 98.7 F (37.1 C), temperature source Oral, resp. rate 18, height $RemoveBe'5\' 2"'OSKoaBUJT$  (1.575 m), weight 129 lb 8 oz (58.7 kg), SpO2 99 %.   Blood pressure standing is 99/57 with a pulse of 106.  Wt Readings from Last 3 Encounters:  08/23/20 129 lb 8 oz (58.7 kg)  08/17/20 139 lb (63 kg)  08/15/20 134 lb 12 oz (61.1 kg)    Body mass index is 23.69 kg/m.  Performance status (ECOG): 1 - Symptomatic but completely ambulatory  PHYSICAL EXAM:  Physical Exam Vitals and nursing note reviewed.  Constitutional:      General: She is not in acute distress.    Appearance: Normal appearance.  HENT:     Head: Normocephalic and atraumatic.     Mouth/Throat:     Mouth: Mucous membranes are dry. Oral lesions (Mucositis especially of the palate) present.  Pharynx: No oropharyngeal exudate or posterior oropharyngeal erythema.  Eyes:     General: No scleral icterus.    Extraocular Movements: Extraocular movements intact.     Conjunctiva/sclera: Conjunctivae normal.     Pupils: Pupils are equal, round, and reactive to light.  Cardiovascular:     Rate and Rhythm: Normal rate and regular rhythm.     Heart sounds: Normal heart sounds. No murmur heard. No  friction rub. No gallop.   Pulmonary:     Effort: Pulmonary effort is normal.     Breath sounds: Normal breath sounds. No rhonchi or rales.  Chest:  Breasts:     Right: Normal. No axillary adenopathy or supraclavicular adenopathy.     Left: Mass present. No swelling, bleeding, inverted nipple, nipple discharge, skin change, tenderness, axillary adenopathy or supraclavicular adenopathy.      Comments: Persistent mass in the left upper breast measuring about 2.5 cm. Abdominal:     General: There is no distension.     Palpations: Abdomen is soft. There is no splenomegaly or mass.     Tenderness: There is no abdominal tenderness.  Musculoskeletal:        General: Normal range of motion.     Cervical back: Normal range of motion and neck supple. No tenderness.     Right lower leg: No edema.     Left lower leg: No edema.  Lymphadenopathy:     Cervical: No cervical adenopathy.     Upper Body:     Right upper body: No supraclavicular or axillary adenopathy.     Left upper body: No supraclavicular or axillary adenopathy.     Lower Body: No right inguinal adenopathy. No left inguinal adenopathy.  Skin:    General: Skin is warm and dry.     Coloration: Skin is not jaundiced.     Findings: No rash.     Comments: Skin turgor is decreased.  Neurological:     Mental Status: She is alert and oriented to person, place, and time.     Cranial Nerves: No cranial nerve deficit.     Sensory: Sensation is intact.     Motor: Motor function is intact.     Coordination: Coordination is intact.  Psychiatric:        Mood and Affect: Mood normal.        Behavior: Behavior normal.        Thought Content: Thought content normal.    LABS:   CBC Latest Ref Rng & Units 08/23/2020 08/10/2020 07/20/2020  WBC - 22.1 12.4 8.2  Hemoglobin 12.0 - 16.0 10.4(A) 11.0(A) 11.7(A)  Hematocrit 36 - 46 32(A) 33(A) 35(A)  Platelets 150 - 399 204 237 238   CMP Latest Ref Rng & Units 08/23/2020 08/10/2020 07/20/2020  Glucose  70 - 99 mg/dL - - -  BUN 4 - 21 20 11 14   Creatinine 0.5 - 1.1 1.9(A) 0.6 0.6  Sodium 137 - 147 134(A) 138 140  Potassium 3.4 - 5.3 3.3(A) 3.6 4.8  Chloride 99 - 108 96(A) 101 103  CO2 13 - 22 28(A) 31(A) 30(A)  Calcium 8.7 - 10.7 9.3 9.4 10.1  Alkaline Phos 25 - 125 153(A) 111 91  AST 13 - 35 24 41(A) 30  ALT 7 - 35 22 42(A) 35     No results found for: CEA1 / No results found for: CEA1 No results found for: PSA1 No results found for: JTT017 No results found for: BLT903  No results found for:  TOTALPROTELP, ALBUMINELP, A1GS, A2GS, BETS, BETA2SER, GAMS, MSPIKE, SPEI No results found for: TIBC, FERRITIN, IRONPCTSAT No results found for: LDH  STUDIES:  No results found.    HISTORY:   Past Medical History:  Diagnosis Date  . Cancer ([place])   . Depression   . Diabetes mellitus without complication ([place])   . Hyperlipidemia   . Hypomagnesemia 08/10/2020  . Sleep apnea     Past Surgical History:  Procedure Laterality Date  . ABDOMINAL HYSTERECTOMY    . BREAST SURGERY Left   . HYSTERECTOMY ABDOMINAL WITH SALPINGO-OOPHORECTOMY  07/2003   due to fibroids  . PORTACATH PLACEMENT Right 06/26/2020   Procedure: INSERTION PORT-A-CATH;  Surgeon: [name], MD;  Location: [facility];  Service: General;  Laterality: Right;  . TRIGGER FINGER RELEASE      Family History  Problem Relation Age of Onset  . Breast cancer Mother 31  . Breast cancer Maternal Aunt 25  . Ovarian cancer Maternal Grandmother 76  . Bone cancer Maternal Grandfather        50s  . Cancer Maternal Aunt 76    Social History:  reports that she has never smoked. She has never used smokeless tobacco. She reports current alcohol use. No history on file for drug use.The patient is alone today.  Allergies:  Allergies  Allergen Reactions  . Oxycodone Itching  . Codeine Other (See Comments)    Other reaction(s): Other (See Comments) Unknown Unknown   . Lisinopril     Other reaction(s):  Cough (ALLERGY/intolerance)    Current Medications: Current Outpatient Medications  Medication Sig Dispense Refill  . Accu-Chek FastClix Lancets MISC Apply topically.    Marland Kitchen aspirin 81 MG chewable tablet Chew by mouth.    . benzonatate (TESSALON) 100 MG capsule     . calcium-vitamin D 250-100 MG-UNIT tablet Take by mouth.    . celecoxib (CELEBREX) 100 MG capsule Take by mouth.    . dexamethasone (DECADRON) 4 MG tablet Take 2 tablets (8 mg total) by mouth 2 (two) times daily. Start the day before Taxotere. Then take daily x 3 days after chemotherapy. (Patient taking differently: Take 4 mg by mouth 2 (two) times daily. Start the day before Taxotere. Then take daily x 3 days after chemotherapy.) 30 tablet 1  . diphenoxylate-atropine (LOMOTIL) 2.5-0.025 MG tablet Take 2 tablets by mouth 4 (four) times daily as needed for diarrhea or loose stools. 60 tablet 3  . Docusate Sodium (DSS) 100 MG CAPS Take by mouth.    . DULoxetine (CYMBALTA) 60 MG capsule Take 1 capsule by mouth daily.    Marland Kitchen FLUoxetine (PROZAC) 40 MG capsule Take by mouth.    . fluticasone (FLONASE) 50 MCG/ACT nasal spray     . HYDROcodone-acetaminophen (NORCO) 7.5-325 MG tablet Take 1 tablet by mouth every 4 (four) hours as needed for moderate pain. 120 tablet 0  . lidocaine-prilocaine (EMLA) cream Apply to affected area once 30 g 3  . losartan (COZAAR) 50 MG tablet daily.    . magic mouthwash (nystatin, lidocaine, diphenhydrAMINE) suspension Take 5 mLs by mouth every 3 (three) hours as needed for mouth pain. Swish & Spit; 218m bottle [facility] @@ 1305 per Melissa P,NP    . metFORMIN (GLUCOPHAGE) 500 MG tablet 2 (two) times daily.    . Multiple Vitamins-Minerals (THERA-M) TABS Take by mouth.    . ondansetron (ZOFRAN) 8 MG tablet Take 1 tablet (8 mg total) by mouth 2 (two) times  daily as needed (Nausea or vomiting). Start on the third day after chemotherapy. 30 tablet 1  . prochlorperazine (COMPAZINE) 10  MG tablet Take 1 tablet (10 mg total) by mouth every 6 (six) hours as needed (Nausea or vomiting). 30 tablet 1  . rosuvastatin (CRESTOR) 40 MG tablet daily.    . traMADol (ULTRAM) 50 MG tablet TAKE 1 TO 2 TABLETS BY MOUTH EVERY 6 HOURS AS NEEDED 30 tablet 0  . vitamin B-12 (CYANOCOBALAMIN) 500 MCG tablet Take 500 mcg by mouth daily.     No current facility-administered medications for this visit.   Facility-Administered Medications Ordered in Other Visits  Medication Dose Route Frequency Provider Last Rate Last Admin  . 0.9 %  sodium chloride infusion   Intravenous Once Estanislado Surgeon A, PA-C      . 0.9 %  sodium chloride infusion   Intravenous Once Daquan Crapps A, PA-C      . 0.9 %  sodium chloride infusion   Intravenous Once Prapti Grussing A, PA-C      . heparin lock flush 100 unit/mL  500 Units Intracatheter Once PRN Lunette Tapp, Vida Roller A, PA-C      . magnesium sulfate IVPB 4 g 100 mL  4 g Intravenous Once Denilson Salminen A, PA-C      . potassium chloride 10 mEq in 100 mL IVPB  10 mEq Intravenous Q1 Hr x 2 Aloise Copus, Vida Roller A, PA-C         ASSESSMENT & PLAN:   Assessment/Plan:  1. New left breast mass in the left upper inner quadrant biopsy proven to be poorly differentiated carcinoma in a background of adipose tissue with fibrosis and necrosis, consistent with breast origin. HER2 positive.  This more likely a recurrence rather than a second breast primary. There really is no way to prove this and the slides from her prior cancer are no longer available.   She is receiving neoadjuvant TCHP and having difficulty tolerating this with anorexia, taste changes, weight loss, and diarrhea.  2. Diarrhea, for which she is using Lomotil with some control.  I advised her to alternate Lomotil and Imodium to better control her diarrhea, but she has not been doing this.  I advised her of the importance of controlling her diarrhea to avoid dehydration and electrolyte abnormalities.  If the diarrhea persists we may  need to consider decreasing doses of the HER2 targeted therapy or eliminating pertuzumab.  3. Neuropathy, which is stable.    4. Worsening hypomagnesemia. I will give her IV magnesium 4 g today.  Due to the diarrhea, I will continue to hold off on oral magnesium supplement  5. Taste changes, decreased appetite and weight loss.  We talked about strategies for dealing with taste changes and encouraged her to try different foods in to prevent further weight loss. I stressed the importance of maintaining her weight in order to continue chemotherapy.  6. Dehydration with increased creatinine.  I will give her IV fluids today.  7. Mild hypokalemia.  I will give her IV potassium today as well.  8. Mouth sores for which she has MBX solution.  I offered to see the patient again next week, but she states she will call us if she continues to have difficulties. We will plan to see her back  as previously scheduled with a CBC and comprehensive metabolic panel prior to a 4th cycle of TCHP. The patient understands the plans discussed today and is in agreement with them.  She knows to contact  our office if she develops concerns prior to her next appointment.   I provided 30 minutes of face-to-face time during this this encounter and > 50% was spent counseling as documented under my assessment and plan.    Marvia Pickles, PA-C

## 2020-08-23 NOTE — Telephone Encounter (Signed)
RE: Dizzy, fall x 1, not eating, little po fluid intake Received: Today Mosher, Beryle Flock, RN Phone Number: [phone]   Labs and appt with me early afternoon. Thanks!        Previous Messages   ----- Message -----  From: Dairl Ponder, RN  Sent: 08/23/2020 10:45 AM EDT  To: Marvia Pickles, PA-C  Subject: Dizzy, fall x 1, not eating, little po fluid*   Received call from pt's spouse, Jimmy. He states, "Jazminn isn't doing good @ all. She is really dizzy, sleeping until 2p everyday. I understand she needs rest because of the fatigue, but she isn't doing much at all. She isn't eating at all, she tells me everything has a metallic taste. I've tried cooking her everything I can think of. I've tried to get her to drink Pedialyte, Ensure, and water - but she only takes sips". I asked if she was still having diarrhea. He replied, "yes". I asked if she was taking the Imodium correctly. He replied "I know she is taking some. She messed herself yesterday". I educated him in proper administration of the Imodium, up to 8 tabs per day. Also, I gave him some tips to help with the metallic taste, such as using plastic wear (which he was doing), avoiding red meats, trying OTC zinc qd, frequent mouth care, sugarless mints/candies, and he could freeze grapes/cherries to help keep mouth moistened. Afebrile. She has fallen once, trying to get up from chair. Please advise.

## 2020-08-23 NOTE — Telephone Encounter (Signed)
ERROR

## 2020-08-30 NOTE — Progress Notes (Signed)
[name]  [address] [place],  [name]  [address] [phone]  Clinic Day:  08/31/2020  Referring physician: Delphina Cahill, FNP   CHIEF COMPLAINT:  CC: A 70 year old female with history of recurrent Her2 receptor positive breast cancer here for pre-treatment evaluation  Current Treatment:   TCHP (docetaxel/carboplatin/trastuzumab/pertuzumab ) every 3 weeks   HISTORY OF PRESENT ILLNESS:  Tammy Fowler is a 70 y.o. 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 Excisional biopsy revealed a 2 cm, grade 2, infiltrating ductal carcinoma in the left lower quadrant. Estrogen and progesterone receptors were positive and her 2 Neu negative. Tumor was present at the margins, so she underwent re-excision and axillary dissection. 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 Four of sixteen lymph nodes were positive for metastasis. All surgical margins were free of tumor involvement. She received adjuvant chemotherapy with with dose dense Adriamycin and Cytoxan for 4 cycles, followed by dose dense Taxol for 4 cycles, follwed by adjuvant radiation of the left breast. She was placed on anastrozole 1 mg daily in April 2005 and completed 10 years of therapy in April 2015. She underwent left latissimus dorsi muscle reconstruction of the left breast and right breast reduction. She underwent total abdominal hysterectomy and bilateral salpingo-oophorectomy prior to starting anastrozole. She had excision of a benign mass from the left breast in 2009. She underwent testing for BRCA mutations, including BART, in April 2013, which was negative, however, we did not have the results. She has a history of osteoporosis, previously treated with zolendronic acid every 6 months, which was discontinued when she completed anastrozole.   We began  seeing her in July 2014, when she relocated to this area. Bone density scan in December 2015 was normal. Her personal and family history wass suggestive of a hereditary cancer syndrome, so we recommended additional genetic testing with the [employer] myRisk Update Panel test to look for a mutation in other genes known to increase the risk for breast cancer and as well as other cancers. 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 These results were reviewed with her in detail and she was scheduled for an breast MRI which did not reveal any evidence of malignancy. She was unsure about undergoing prophylactic bilateral mastectomies. She was seen by Dr. Coralie Keens who referred her to Dr. Henrietta Hoover regarding reconstructive surgery. She was interested in reconstruction and the plastic surgeon that she saw discouraged her because of the increased risk of complications with surgery on previously irradiated tissue. The patient so far has continued close surveillance with annual mammogram and breast MRI spaced out 6 months. Bone density in January 2019was normal. Breast MRI in August 2019 did not reveal any evidence of malignancyShe developed diabetes and had an MRI abdomen, which did not reveal any evidence of pancreatic cancer. She  had colonoscopy in early 2020by Dr. Marcell Anger in [place] and had 2 polyps removed. Repeat colonoscopy in 5 years was recommended. Bilateral screening mammogram in March 2020 and April 2021 did not reveal any evidence of malignancy.  She was seen in January 2022 with a new palpable mass in the left breast.  Diagnostic left mammogram on January 12th revealed a  suspicious mass measuring 2.0 cm at 10 o'clock, which accounted for the patients palpable area of concern. Diagnostic right mammogram did not reveal any  evidence of malignancy.  Ultrasound guided biopsy on January 19th revealed poorly differentiated carcinoma in a background of adipose tissue with fibrosis and necrosis, consistent with breast origin. HER2 positive.  Estrogen receptor was 30% positive and progesterone receptor was negative.  Ki67 was 25%.  However, there was no breast tissue in the sample, so this was felt to be more likely a recurrence rather than a new breast primary, even though it has been many years. Her previous cancer was HER2 negative and ER positive. We recommended neoadjuvant chemotherapy/HER2 targeted therapy with docetaxel/carboplatin/trastuzumab/pertuzumab [facility]) which she started treatment with [facility] on February 15th.  She has experienced decreased appetite, taste changes, mouth sores, and diarrhea despite imodium. She was given a prescription for Lomotil to better control her diarrhea. MBX has been effective for her mouth sores  She also had heartburn for which omeprazole was effective.  She received a 3rd cycle of TCHP on March 30th.  At that time she continued to report diarrhea, but was not alternating Lomotil and Imodium.  She had lost weight, as she was not eating well due to taste changes.  INTERVAL HISTORY:  [name] is here today for evaluation prior to her 4th cycle of TCHP. She presented to clinic last week for multiple complaints including weakness, diarrhea, mouth sores and decreased oral intake. She was given IVF as well as IV magnesium and potassium. Today she reports feeling much better. Her diarrhea has resolved as well as her mouth sores. She is feeling stronger and tolerating oral intake. She is up 2 pounds since last visit. She denies fever, chills, nausea or vomiting. She denies shortness of breath, chest pain and cough. She denies issue with bowel or bladder. CBC today is unremarkable. CMP is unremarkable, other than magnesium 1.4.  REVIEW OF SYSTEMS:  Review of Systems  Constitutional: Negative for appetite  change, chills, diaphoresis, fatigue, fever and unexpected weight change.  HENT:   Negative for hearing loss, lump/mass, mouth sores, nosebleeds, sore throat, tinnitus, trouble swallowing and voice change.   Eyes: Negative for eye problems and icterus.       Increased tearing  Respiratory: Negative for chest tightness, cough, hemoptysis, shortness of breath and wheezing.   Cardiovascular: Negative for chest pain, leg swelling and palpitations.  Gastrointestinal: Negative for abdominal distention, abdominal pain, blood in stool, constipation, diarrhea, nausea, rectal pain and vomiting.  Endocrine: Negative for hot flashes.  Genitourinary: Negative for bladder incontinence, difficulty urinating, dyspareunia, dysuria, frequency, hematuria and nocturia.   Musculoskeletal: Negative for arthralgias, back pain, flank pain, gait problem, myalgias, neck pain and neck stiffness.  Skin: Negative for itching, rash and wound.       Skin peeling noted to fingers bilaterally  Neurological: Positive for numbness (Stable peripheral neuropathy). Negative for dizziness, extremity weakness, gait problem, headaches, light-headedness, seizures and speech difficulty.  Hematological: Negative for adenopathy. Does not bruise/bleed easily.  Psychiatric/Behavioral: Negative for confusion, decreased concentration, depression, sleep disturbance and suicidal ideas. The patient is not nervous/anxious.      VITALS:  Blood pressure (!) 109/91, pulse (!) 109, temperature 98.4 F (36.9 C), temperature source Oral, resp. rate 18, height _0  (1.575 m), weight 131 lb 11.2 oz (59.7 kg), SpO2 96 %.   Blood pressure standing is 99/57 with a pulse of 106.  Wt Readings from Last 3 Encounters:  08/31/20 131 lb 11.2  oz (59.7 kg)  08/23/20 129 lb 8 oz (58.7 kg)  08/17/20 139 lb (63 kg)    Body mass index is 24.09 kg/m.  Performance status (ECOG): 1 - Symptomatic but completely ambulatory  PHYSICAL EXAM:  Physical  Exam Constitutional:      General: She is not in acute distress.    Appearance: Normal appearance. She is normal weight. She is not ill-appearing, toxic-appearing or diaphoretic.  HENT:     Head: Normocephalic and atraumatic.     Nose: Nose normal. No congestion or rhinorrhea.     Mouth/Throat:     Mouth: Mucous membranes are moist.     Pharynx: Oropharynx is clear. No oropharyngeal exudate or posterior oropharyngeal erythema.  Eyes:     General: No scleral icterus.       Right eye: No discharge.        Left eye: No discharge.     Extraocular Movements: Extraocular movements intact.     Conjunctiva/sclera: Conjunctivae normal.     Pupils: Pupils are equal, round, and reactive to light.  Neck:     Vascular: No carotid bruit.  Cardiovascular:     Rate and Rhythm: Normal rate and regular rhythm.     Heart sounds: No murmur heard. No friction rub. No gallop.   Pulmonary:     Effort: Pulmonary effort is normal. No respiratory distress.     Breath sounds: Normal breath sounds. No stridor. No wheezing, rhonchi or rales.  Chest:     Chest wall: No tenderness.  Abdominal:     General: Abdomen is flat. Bowel sounds are normal. There is no distension.     Palpations: There is no mass.     Tenderness: There is no abdominal tenderness. There is no right CVA tenderness, left CVA tenderness, guarding or rebound.     Hernia: No hernia is present.  Musculoskeletal:        General: No swelling, tenderness, deformity or signs of injury. Normal range of motion.     Cervical back: Normal range of motion and neck supple. No rigidity or tenderness.     Right lower leg: No edema.     Left lower leg: No edema.  Lymphadenopathy:     Cervical: No cervical adenopathy.  Skin:    General: Skin is warm and dry.     Capillary Refill: Capillary refill takes less than 2 seconds.     Coloration: Skin is not jaundiced or pale.     Findings: No bruising, erythema, lesion or rash.     Comments: Skin peeling  noted to tips of fingers bilaterally  Neurological:     General: No focal deficit present.     Mental Status: She is alert and oriented to person, place, and time. Mental status is at baseline.     Cranial Nerves: No cranial nerve deficit.     Sensory: No sensory deficit.     Motor: No weakness.     Coordination: Coordination normal.     Gait: Gait normal.     Deep Tendon Reflexes: Reflexes normal.  Psychiatric:        Mood and Affect: Mood normal.        Behavior: Behavior normal.        Thought Content: Thought content normal.        Judgment: Judgment normal.    LABS:   CBC Latest Ref Rng & Units 08/23/2020 08/10/2020 07/20/2020  WBC - 22.1 12.4 8.2  Hemoglobin 12.0 - 16.0 10.4(A)  11.0(A) 11.7(A)  Hematocrit 36 - 46 32(A) 33(A) 35(A)  Platelets 150 - 399 204 237 238   CMP Latest Ref Rng & Units 08/31/2020 08/23/2020 08/10/2020  Glucose 70 - 99 mg/dL - - -  BUN 4 - _0 Creatinine 0.5 - 1.1 0.7 1.9(A) 0.6  Sodium 137 - 147 140 134(A) 138  Potassium 3.4 - 5.3 4.2 3.3(A) 3.6  Chloride 99 - 108 102 96(A) 101  CO2 13 - 22 32(A) 28(A) 31(A)  Calcium 8.7 - 10.7 9.4 9.3 9.4  Alkaline Phos 25 - 125 110 153(A) 111  AST 13 - 35 34 24 41(A)  ALT 7 - 35 25 22 42(A)     No results found for: CEA1 / No results found for: CEA1 No results found for: PSA1 No results found for: VPX106 No results found for: YIR485  No results found for: TOTALPROTELP, ALBUMINELP, A1GS, A2GS, BETS, BETA2SER, GAMS, MSPIKE, SPEI No results found for: TIBC, FERRITIN, IRONPCTSAT No results found for: LDH  STUDIES:  No results found.    HISTORY:   Past Medical History:  Diagnosis Date  . Cancer ([place])   . Depression   . Diabetes mellitus without complication ([place])   . Hyperlipidemia   . Hypomagnesemia 08/10/2020  . Sleep apnea     Past Surgical History:  Procedure Laterality Date  . ABDOMINAL HYSTERECTOMY    . BREAST SURGERY Left   . HYSTERECTOMY ABDOMINAL WITH SALPINGO-OOPHORECTOMY  07/2003    due to fibroids  . PORTACATH PLACEMENT Right 06/26/2020   Procedure: INSERTION PORT-A-CATH;  Surgeon: [name], MD;  Location: [facility];  Service: General;  Laterality: Right;  . TRIGGER FINGER RELEASE      Family History  Problem Relation Age of Onset  . Breast cancer Mother 75  . Breast cancer Maternal Aunt 25  . Ovarian cancer Maternal Grandmother 107  . Bone cancer Maternal Grandfather        67s  . Cancer Maternal Aunt 53    Social History:  reports that she has never smoked. She has never used smokeless tobacco. She reports current alcohol use. No history on file for drug use.The patient is alone today.  Allergies:  Allergies  Allergen Reactions  . Oxycodone Itching  . Codeine Other (See Comments)    Other reaction(s): Other (See Comments) Unknown Unknown   . Lisinopril     Other reaction(s): Cough (ALLERGY/intolerance)    Current Medications: Current Outpatient Medications  Medication Sig Dispense Refill  . magnesium oxide (MAG-OX) 400 (241.3 Mg) MG tablet Take 1 tablet (400 mg total) by mouth 2 (two) times daily. 60 tablet 1  . Accu-Chek FastClix Lancets MISC Apply topically.    Marland Kitchen aspirin 81 MG chewable tablet Chew by mouth.    . benzonatate (TESSALON) 100 MG capsule     . calcium-vitamin D 250-100 MG-UNIT tablet Take by mouth.    . celecoxib (CELEBREX) 100 MG capsule Take by mouth.    . dexamethasone (DECADRON) 4 MG tablet Take 2 tablets (8 mg total) by mouth 2 (two) times daily. Start the day before Taxotere. Then take daily x 3 days after chemotherapy. (Patient taking differently: Take 4 mg by mouth 2 (two) times daily. Start the day before Taxotere. Then take daily x 3 days after chemotherapy.) 30 tablet 1  . diphenoxylate-atropine (LOMOTIL) 2.5-0.025 MG tablet Take 2 tablets by mouth 4 (four) times daily as needed for diarrhea or loose stools. 60 tablet 3  .  Docusate Sodium (DSS) 100 MG CAPS Take by mouth.    . DULoxetine (CYMBALTA)  60 MG capsule Take 1 capsule by mouth daily.    Marland Kitchen FLUoxetine (PROZAC) 40 MG capsule Take by mouth.    . fluticasone (FLONASE) 50 MCG/ACT nasal spray     . HYDROcodone-acetaminophen (NORCO) 7.5-325 MG tablet Take 1 tablet by mouth every 4 (four) hours as needed for moderate pain. 120 tablet 0  . lidocaine-prilocaine (EMLA) cream Apply to affected area once 30 g 3  . losartan (COZAAR) 50 MG tablet daily.    . magic mouthwash (nystatin, lidocaine, diphenhydrAMINE) suspension Take 5 mLs by mouth every 3 (three) hours as needed for mouth pain. Swish & Spit; 261m bottle 3 [facility]ills [facility] 07[facility] @@ 1305 per Jakhi Dishman P,NP    . metFORMIN (GLUCOPHAGE) 500 MG tablet 2 (two) times daily.    . Multiple Vitamins-Minerals (THERA-M) TABS Take by mouth.    . ondansetron (ZOFRAN) 8 MG tablet Take 1 tablet (8 mg total) by mouth 2 (two) times daily as needed (Nausea or vomiting). Start on the third day after chemotherapy. 30 tablet 1  . prochlorperazine (COMPAZINE) 10 MG tablet Take 1 tablet (10 mg total) by mouth every 6 (six) hours as needed (Nausea or vomiting). 30 tablet 1  . rosuvastatin (CRESTOR) 40 MG tablet daily.    . traMADol (ULTRAM) 50 MG tablet Take 1-2 tablets (50-100 mg total) by mouth every 6 (six) hours as needed. 60 tablet 0  . vitamin B-12 (CYANOCOBALAMIN) 500 MCG tablet Take 500 mcg by mouth daily.     No current facility-administered medications for this visit.     ASSESSMENT & PLAN:   Assessment/Plan:  1. New left breast mass in the left upper inner quadrant biopsy proven to be poorly differentiated carcinoma in a background of adipose tissue with fibrosis and necrosis, consistent with breast origin. HER2 positive.  This more likely a recurrence rather than a second breast primary. There really is no way to prove this and the slides from her prior cancer are no longer available.   She is receiving neoadjuvant TCHP and is due cycle 4 next week.   2. Diarrhea has resolved  since last visit.  3. Neuropathy, which is stable.    4. Hypomagnesemia. This remains low today at 1.4  5. Taste changes, decreased appetite and weight loss.  This has improved since last visit and she has gained 2 pounds   I refilled her pain medications today as well as sent in a prescription for Lomotil and magnesium. She will proceed with cycle 4 next week. I will repeat magnesium on day of treatment and replace if needed. She will continue to increase her oral intake with a goal of gaining more weight. She will use a moisturizing cream for her hands. We will monitor the increasing tears as this is most likely related to docetaxel. She will also use an OTC antihistamine. We will see her back in 3 weeks for repeat CBC, CMP and evaluation prior to her next cycle of treatment. I encouraged her to call if she experiences post treatment symptoms such as diarrhea so that we can manage those in a timely manner.   She verbalizes understanding of and agreement to the plans discussed today. She knows to call the office should any new questions or concerns arise.     MMelodye Ped NP

## 2020-08-31 ENCOUNTER — Other Ambulatory Visit: Payer: Self-pay

## 2020-08-31 ENCOUNTER — Encounter: Payer: Self-pay | Admitting: Hematology and Oncology

## 2020-08-31 ENCOUNTER — Inpatient Hospital Stay (INDEPENDENT_AMBULATORY_CARE_PROVIDER_SITE_OTHER): Payer: Medicare Other | Admitting: Hematology and Oncology

## 2020-08-31 ENCOUNTER — Telehealth: Payer: Self-pay | Admitting: Hematology and Oncology

## 2020-08-31 ENCOUNTER — Other Ambulatory Visit: Payer: Self-pay | Admitting: Hematology and Oncology

## 2020-08-31 ENCOUNTER — Inpatient Hospital Stay: Payer: Medicare Other

## 2020-08-31 VITALS — BP 109/91 | HR 109 | Temp 98.4°F | Resp 18 | Ht 62.0 in | Wt 131.7 lb

## 2020-08-31 DIAGNOSIS — C50911 Malignant neoplasm of unspecified site of right female breast: Secondary | ICD-10-CM

## 2020-08-31 LAB — COMPREHENSIVE METABOLIC PANEL
Albumin: 4.1 (ref 3.5–5.0)
Calcium: 9.4 (ref 8.7–10.7)

## 2020-08-31 LAB — HEPATIC FUNCTION PANEL
ALT: 25 (ref 7–35)
AST: 34 (ref 13–35)
Alkaline Phosphatase: 110 (ref 25–125)
Bilirubin, Total: 0.5

## 2020-08-31 LAB — BASIC METABOLIC PANEL
BUN: 5 (ref 4–21)
CO2: 32 — AB (ref 13–22)
Chloride: 102 (ref 99–108)
Creatinine: 0.7 (ref 0.5–1.1)
Glucose: 115
Potassium: 4.2 (ref 3.4–5.3)
Sodium: 140 (ref 137–147)

## 2020-08-31 LAB — MAGNESIUM: Magnesium: 1.4

## 2020-08-31 MED ORDER — TRAMADOL HCL 50 MG PO TABS: 50.0000 mg | ORAL_TABLET | Freq: Four times a day (QID) | ORAL | 0 refills | Status: DC | PRN

## 2020-08-31 MED ORDER — DIPHENOXYLATE-ATROPINE 2.5-0.025 MG PO TABS
2.0000 | ORAL_TABLET | Freq: Four times a day (QID) | ORAL | 3 refills | Status: DC | PRN
Start: 2020-08-31 — End: 2022-02-27

## 2020-08-31 MED ORDER — HYDROCODONE-ACETAMINOPHEN 7.5-325 MG PO TABS: 1.0000 | ORAL_TABLET | ORAL | 0 refills | Status: DC | PRN

## 2020-08-31 MED ORDER — MAGNESIUM OXIDE 400 (241.3 MG) MG PO TABS: 400.0000 mg | ORAL_TABLET | Freq: Two times a day (BID) | ORAL | 1 refills | Status: AC

## 2020-08-31 NOTE — Telephone Encounter (Signed)
Per 4/15 los next appt scheduled and given to patient

## 2020-09-04 ENCOUNTER — Encounter: Payer: Self-pay | Admitting: Oncology

## 2020-09-04 NOTE — Progress Notes (Unsigned)
Per [employer] web-page:  No PA required for CPT 93306 ECHO  No PA required from TCFL

## 2020-09-05 ENCOUNTER — Other Ambulatory Visit: Payer: Self-pay | Admitting: Pharmacist

## 2020-09-05 ENCOUNTER — Other Ambulatory Visit: Payer: Self-pay | Admitting: Hematology and Oncology

## 2020-09-05 ENCOUNTER — Other Ambulatory Visit: Payer: Self-pay

## 2020-09-05 ENCOUNTER — Inpatient Hospital Stay: Payer: Medicare Other

## 2020-09-05 VITALS — BP 133/63 | HR 103 | Temp 98.1°F | Resp 18 | Ht 62.0 in | Wt 132.0 lb

## 2020-09-05 DIAGNOSIS — C50911 Malignant neoplasm of unspecified site of right female breast: Secondary | ICD-10-CM

## 2020-09-05 DIAGNOSIS — Z5112 Encounter for antineoplastic immunotherapy: Secondary | ICD-10-CM | POA: Diagnosis not present

## 2020-09-05 MED ORDER — SODIUM CHLORIDE 0.9 % IV SOLN
461.4000 mg | Freq: Once | INTRAVENOUS | Status: AC
  Administered 2020-09-05: 460 mg via INTRAVENOUS
  Filled 2020-09-05: qty 46

## 2020-09-05 MED ORDER — DEXAMETHASONE SODIUM PHOSPHATE 10 MG/ML IJ SOLN
8.0000 mg | Freq: Once | INTRAMUSCULAR | Status: AC
  Administered 2020-09-05: 8 mg via INTRAVENOUS

## 2020-09-05 MED ORDER — DIPHENHYDRAMINE HCL 50 MG/ML IJ SOLN
INTRAMUSCULAR | Status: AC
  Filled 2020-09-05: qty 1

## 2020-09-05 MED ORDER — TRASTUZUMAB-ANNS CHEMO 150 MG IV SOLR
6.0000 mg/kg | Freq: Once | INTRAVENOUS | Status: AC
  Administered 2020-09-05: 357 mg via INTRAVENOUS
  Filled 2020-09-05: qty 17

## 2020-09-05 MED ORDER — SODIUM CHLORIDE 0.9 % IV SOLN
420.0000 mg | Freq: Once | INTRAVENOUS | Status: AC
  Administered 2020-09-05: 420 mg via INTRAVENOUS
  Filled 2020-09-05: qty 14

## 2020-09-05 MED ORDER — ACETAMINOPHEN 325 MG PO TABS
650.0000 mg | ORAL_TABLET | Freq: Once | ORAL | Status: AC
  Administered 2020-09-05: 650 mg via ORAL

## 2020-09-05 MED ORDER — DIPHENHYDRAMINE HCL 50 MG/ML IJ SOLN
25.0000 mg | Freq: Once | INTRAMUSCULAR | Status: AC
  Administered 2020-09-05: 25 mg via INTRAVENOUS

## 2020-09-05 MED ORDER — PALONOSETRON HCL INJECTION 0.25 MG/5ML
0.2500 mg | Freq: Once | INTRAVENOUS | Status: AC
  Administered 2020-09-05: 0.25 mg via INTRAVENOUS

## 2020-09-05 MED ORDER — SODIUM CHLORIDE 0.9 % IV SOLN
75.0000 mg/m2 | Freq: Once | INTRAVENOUS | Status: AC
  Administered 2020-09-05: 120 mg via INTRAVENOUS
  Filled 2020-09-05: qty 12

## 2020-09-05 MED ORDER — ACETAMINOPHEN 325 MG PO TABS
ORAL_TABLET | ORAL | Status: AC
  Filled 2020-09-05: qty 2

## 2020-09-05 MED ORDER — SODIUM CHLORIDE 0.9 % IV SOLN
150.0000 mg | Freq: Once | INTRAVENOUS | Status: AC
  Administered 2020-09-05: 150 mg via INTRAVENOUS
  Filled 2020-09-05: qty 5

## 2020-09-05 MED ORDER — SODIUM CHLORIDE 0.9 % IV SOLN
Freq: Once | INTRAVENOUS | Status: AC
  Filled 2020-09-05: qty 250

## 2020-09-05 MED ORDER — DEXAMETHASONE SODIUM PHOSPHATE 10 MG/ML IJ SOLN
INTRAMUSCULAR | Status: AC
  Filled 2020-09-05: qty 1

## 2020-09-05 MED ORDER — PALONOSETRON HCL INJECTION 0.25 MG/5ML
INTRAVENOUS | Status: AC
  Filled 2020-09-05: qty 5

## 2020-09-05 NOTE — Progress Notes (Signed)
1445 pt d/c stable.

## 2020-09-05 NOTE — Patient Instructions (Signed)
[facility] Discharge Instructions for Patients Receiving Chemotherapy  Today you received the following chemotherapy agents kanjinti, perjeta, taxotere, carboplatin  To help prevent nausea and vomiting after your treatment, we encourage you to take your nausea medication as directed   If you develop nausea and vomiting that is not controlled by your nausea medication, call the clinic.   BELOW ARE SYMPTOMS THAT SHOULD BE REPORTED IMMEDIATELY:  *FEVER GREATER THAN 100.5 F  *CHILLS WITH OR WITHOUT FEVER  NAUSEA AND VOMITING THAT IS NOT CONTROLLED WITH YOUR NAUSEA MEDICATION  *UNUSUAL SHORTNESS OF BREATH  *UNUSUAL BRUISING OR BLEEDING  TENDERNESS IN MOUTH AND THROAT WITH OR WITHOUT PRESENCE OF ULCERS  *URINARY PROBLEMS  *BOWEL PROBLEMS  UNUSUAL RASH Items with * indicate a potential emergency and should be followed up as soon as possible.  Feel free to call the clinic should you have any questions or concerns at The clinic phone number is [phone].  Please show the [facility] at check-in to the Emergency Department and triage nurse.  Trastuzumab injection for infusion What is this medicine? TRASTUZUMAB (tras TOO zoo mab) is a monoclonal antibody. It is used to treat breast cancer and stomach cancer. This medicine may be used for other purposes; ask your health care provider or pharmacist if you have questions. COMMON BRAND NAME(S): Herceptin, Galvin Proffer, Trazimera What should I tell my health care provider before I take this medicine? They need to know if you have any of these conditions:  heart disease  heart failure  lung or breathing disease, like asthma  an unusual or allergic reaction to trastuzumab, benzyl alcohol, or other medications, foods, dyes, or preservatives  pregnant or trying to get pregnant  breast-feeding How should I use this medicine? This drug is given as an infusion into a vein. It  is administered in a hospital or clinic by a specially trained health care professional. Talk to your pediatrician regarding the use of this medicine in children. This medicine is not approved for use in children. Overdosage: If you think you have taken too much of this medicine contact a poison control center or emergency room at once. NOTE: This medicine is only for you. Do not share this medicine with others. What if I miss a dose? It is important not to miss a dose. Call your doctor or health care professional if you are unable to keep an appointment. What may interact with this medicine? This medicine may interact with the following medications:  certain types of chemotherapy, such as daunorubicin, doxorubicin, epirubicin, and idarubicin This list may not describe all possible interactions. Give your health care provider a list of all the medicines, herbs, non-prescription drugs, or dietary supplements you use. Also tell them if you smoke, drink alcohol, or use illegal drugs. Some items may interact with your medicine. What should I watch for while using this medicine? Visit your doctor for checks on your progress. Report any side effects. Continue your course of treatment even though you feel ill unless your doctor tells you to stop. Call your doctor or health care professional for advice if you get a fever, chills or sore throat, or other symptoms of a cold or flu. Do not treat yourself. Try to avoid being around people who are sick. You may experience fever, chills and shaking during your first infusion. These effects are usually mild and can be treated with other medicines. Report any side effects during the infusion to your  health care professional. Fever and chills usually do not happen with later infusions. Do not become pregnant while taking this medicine or for 7 months after stopping it. Women should inform their doctor if they wish to become pregnant or think they might be pregnant.  Women of child-bearing potential will need to have a negative pregnancy test before starting this medicine. There is a potential for serious side effects to an unborn child. Talk to your health care professional or pharmacist for more information. Do not breast-feed an infant while taking this medicine or for 7 months after stopping it. Women must use effective birth control with this medicine. What side effects may I notice from receiving this medicine? Side effects that you should report to your doctor or health care professional as soon as possible:  allergic reactions like skin rash, itching or hives, swelling of the face, lips, or tongue  chest pain or palpitations  cough  dizziness  feeling faint or lightheaded, falls  fever  general ill feeling or flu-like symptoms  signs of worsening heart failure like breathing problems; swelling in your legs and feet  unusually weak or tired Side effects that usually do not require medical attention (report to your doctor or health care professional if they continue or are bothersome):  bone pain  changes in taste  diarrhea  joint pain  nausea/vomiting  weight loss This list may not describe all possible side effects. Call your doctor for medical advice about side effects. You may report side effects to FDA at 1-800-FDA-1088. Where should I keep my medicine? This drug is given in a hospital or clinic and will not be stored at home. NOTE: This sheet is a summary. It may not cover all possible information. If you have questions about this medicine, talk to your doctor, pharmacist, or health care provider.  2021 Elsevier/Gold Standard (2016-04-29 14:37:52) Pertuzumab injection What is this medicine? PERTUZUMAB (per TOOZ ue mab) is a monoclonal antibody. It is used to treat breast cancer. This medicine may be used for other purposes; ask your health care provider or pharmacist if you have questions. COMMON BRAND NAME(S):  PERJETA What should I tell my health care provider before I take this medicine? They need to know if you have any of these conditions:  heart disease  heart failure  high blood pressure  history of irregular heart beat  recent or ongoing radiation therapy  an unusual or allergic reaction to pertuzumab, other medicines, foods, dyes, or preservatives  pregnant or trying to get pregnant  breast-feeding How should I use this medicine? This medicine is for infusion into a vein. It is given by a health care professional in a hospital or clinic setting. Talk to your pediatrician regarding the use of this medicine in children. Special care may be needed. Overdosage: If you think you have taken too much of this medicine contact a poison control center or emergency room at once. NOTE: This medicine is only for you. Do not share this medicine with others. What if I miss a dose? It is important not to miss your dose. Call your doctor or health care professional if you are unable to keep an appointment. What may interact with this medicine? Interactions are not expected. Give your health care provider a list of all the medicines, herbs, non-prescription drugs, or dietary supplements you use. Also tell them if you smoke, drink alcohol, or use illegal drugs. Some items may interact with your medicine. This list may not describe all  possible interactions. Give your health care provider a list of all the medicines, herbs, non-prescription drugs, or dietary supplements you use. Also tell them if you smoke, drink alcohol, or use illegal drugs. Some items may interact with your medicine. What should I watch for while using this medicine? Your condition will be monitored carefully while you are receiving this medicine. Report any side effects. Continue your course of treatment even though you feel ill unless your doctor tells you to stop. Do not become pregnant while taking this medicine or for 7 months  after stopping it. Women should inform their doctor if they wish to become pregnant or think they might be pregnant. Women of child-bearing potential will need to have a negative pregnancy test before starting this medicine. There is a potential for serious side effects to an unborn child. Talk to your health care professional or pharmacist for more information. Do not breast-feed an infant while taking this medicine or for 7 months after stopping it. Women must use effective birth control with this medicine. Call your doctor or health care professional for advice if you get a fever, chills or sore throat, or other symptoms of a cold or flu. Do not treat yourself. Try to avoid being around people who are sick. You may experience fever, chills, and headache during the infusion. Report any side effects during the infusion to your health care professional. What side effects may I notice from receiving this medicine? Side effects that you should report to your doctor or health care professional as soon as possible:  breathing problems  chest pain or palpitations  dizziness  feeling faint or lightheaded  fever or chills  skin rash, itching or hives  sore throat  swelling of the face, lips, or tongue  swelling of the legs or ankles  unusually weak or tired Side effects that usually do not require medical attention (report to your doctor or health care professional if they continue or are bothersome):  diarrhea  hair loss  nausea, vomiting  tiredness This list may not describe all possible side effects. Call your doctor for medical advice about side effects. You may report side effects to FDA at 1-800-FDA-1088. Where should I keep my medicine? This drug is given in a hospital or clinic and will not be stored at home. NOTE: This sheet is a summary. It may not cover all possible information. If you have questions about this medicine, talk to your doctor, pharmacist, or health care  provider.  2021 Elsevier/Gold Standard (2015-06-07 12:08:50) Docetaxel injection What is this medicine? DOCETAXEL (doe se TAX el) is a chemotherapy drug. It targets fast dividing cells, like cancer cells, and causes these cells to die. This medicine is used to treat many types of cancers like breast cancer, certain stomach cancers, head and neck cancer, lung cancer, and prostate cancer. This medicine may be used for other purposes; ask your health care provider or pharmacist if you have questions. COMMON BRAND NAME(S): Docefrez, Taxotere What should I tell my health care provider before I take this medicine? They need to know if you have any of these conditions:  infection (especially a virus infection such as chickenpox, cold sores, or herpes)  liver disease  low blood counts, like low white cell, platelet, or red cell counts  an unusual or allergic reaction to docetaxel, polysorbate 80, other chemotherapy agents, other medicines, foods, dyes, or preservatives  pregnant or trying to get pregnant  breast-feeding How should I use this medicine? This drug  is given as an infusion into a vein. It is administered in a hospital or clinic by a specially trained health care professional. Talk to your pediatrician regarding the use of this medicine in children. Special care may be needed. Overdosage: If you think you have taken too much of this medicine contact a poison control center or emergency room at once. NOTE: This medicine is only for you. Do not share this medicine with others. What if I miss a dose? It is important not to miss your dose. Call your doctor or health care professional if you are unable to keep an appointment. What may interact with this medicine? Do not take this medicine with any of the following medications:  live virus vaccines This medicine may also interact with the following medications:  aprepitant  certain antibiotics like erythromycin or  clarithromycin  certain antivirals for HIV or hepatitis  certain medicines for fungal infections like fluconazole, itraconazole, ketoconazole, posaconazole, or voriconazole  cimetidine  ciprofloxacin  conivaptan  cyclosporine  dronedarone  fluvoxamine  grapefruit juice  imatinib  verapamil This list may not describe all possible interactions. Give your health care provider a list of all the medicines, herbs, non-prescription drugs, or dietary supplements you use. Also tell them if you smoke, drink alcohol, or use illegal drugs. Some items may interact with your medicine. What should I watch for while using this medicine? Your condition will be monitored carefully while you are receiving this medicine. You will need important blood work done while you are taking this medicine. Call your doctor or health care professional for advice if you get a fever, chills or sore throat, or other symptoms of a cold or flu. Do not treat yourself. This drug decreases your body's ability to fight infections. Try to avoid being around people who are sick. Some products may contain alcohol. Ask your health care professional if this medicine contains alcohol. Be sure to tell all health care professionals you are taking this medicine. Certain medicines, like metronidazole and disulfiram, can cause an unpleasant reaction when taken with alcohol. The reaction includes flushing, headache, nausea, vomiting, sweating, and increased thirst. The reaction can last from 30 minutes to several hours. You may get drowsy or dizzy. Do not drive, use machinery, or do anything that needs mental alertness until you know how this medicine affects you. Do not stand or sit up quickly, especially if you are an older patient. This reduces the risk of dizzy or fainting spells. Alcohol may interfere with the effect of this medicine. Talk to your health care professional about your risk of cancer. You may be more at risk for certain  types of cancer if you take this medicine. Do not become pregnant while taking this medicine or for 6 months after stopping it. Women should inform their doctor if they wish to become pregnant or think they might be pregnant. There is a potential for serious side effects to an unborn child. Talk to your health care professional or pharmacist for more information. Do not breast-feed an infant while taking this medicine or for 1 week after stopping it. Males who get this medicine must use a condom during sex with females who can get pregnant. If you get a woman pregnant, the baby could have birth defects. The baby could die before they are born. You will need to continue wearing a condom for 3 months after stopping the medicine. Tell your health care provider right away if your partner becomes pregnant while you are  taking this medicine. This may interfere with the ability to father a child. You should talk to your doctor or health care professional if you are concerned about your fertility. What side effects may I notice from receiving this medicine? Side effects that you should report to your doctor or health care professional as soon as possible:  allergic reactions like skin rash, itching or hives, swelling of the face, lips, or tongue  blurred vision  breathing problems  changes in vision  low blood counts - This drug may decrease the number of white blood cells, red blood cells and platelets. You may be at increased risk for infections and bleeding.  nausea and vomiting  pain, redness or irritation at site where injected  pain, tingling, numbness in the hands or feet  redness, blistering, peeling, or loosening of the skin, including inside the mouth  signs of decreased platelets or bleeding - bruising, pinpoint red spots on the skin, black, tarry stools, nosebleeds  signs of decreased red blood cells - unusually weak or tired, fainting spells, lightheadedness  signs of infection -  fever or chills, cough, sore throat, pain or difficulty passing urine  swelling of the ankle, feet, hands Side effects that usually do not require medical attention (report to your doctor or health care professional if they continue or are bothersome):  constipation  diarrhea  fingernail or toenail changes  hair loss  loss of appetite  mouth sores  muscle pain This list may not describe all possible side effects. Call your doctor for medical advice about side effects. You may report side effects to FDA at 1-800-FDA-1088. Where should I keep my medicine? This drug is given in a hospital or clinic and will not be stored at home. NOTE: This sheet is a summary. It may not cover all possible information. If you have questions about this medicine, talk to your doctor, pharmacist, or health care provider.  2021 Elsevier/Gold Standard (2019-04-04 19:50:31) Trastuzumab injection for infusion What is this medicine? TRASTUZUMAB (tras TOO zoo mab) is a monoclonal antibody. It is used to treat breast cancer and stomach cancer. This medicine may be used for other purposes; ask your health care provider or pharmacist if you have questions. COMMON BRAND NAME(S): Herceptin, Galvin Proffer, Trazimera What should I tell my health care provider before I take this medicine? They need to know if you have any of these conditions:  heart disease  heart failure  lung or breathing disease, like asthma  an unusual or allergic reaction to trastuzumab, benzyl alcohol, or other medications, foods, dyes, or preservatives  pregnant or trying to get pregnant  breast-feeding How should I use this medicine? This drug is given as an infusion into a vein. It is administered in a hospital or clinic by a specially trained health care professional. Talk to your pediatrician regarding the use of this medicine in children. This medicine is not approved for use in children. Overdosage: If  you think you have taken too much of this medicine contact a poison control center or emergency room at once. NOTE: This medicine is only for you. Do not share this medicine with others. What if I miss a dose? It is important not to miss a dose. Call your doctor or health care professional if you are unable to keep an appointment. What may interact with this medicine? This medicine may interact with the following medications:  certain types of chemotherapy, such as daunorubicin, doxorubicin, epirubicin, and idarubicin This list may  not describe all possible interactions. Give your health care provider a list of all the medicines, herbs, non-prescription drugs, or dietary supplements you use. Also tell them if you smoke, drink alcohol, or use illegal drugs. Some items may interact with your medicine. What should I watch for while using this medicine? Visit your doctor for checks on your progress. Report any side effects. Continue your course of treatment even though you feel ill unless your doctor tells you to stop. Call your doctor or health care professional for advice if you get a fever, chills or sore throat, or other symptoms of a cold or flu. Do not treat yourself. Try to avoid being around people who are sick. You may experience fever, chills and shaking during your first infusion. These effects are usually mild and can be treated with other medicines. Report any side effects during the infusion to your health care professional. Fever and chills usually do not happen with later infusions. Do not become pregnant while taking this medicine or for 7 months after stopping it. Women should inform their doctor if they wish to become pregnant or think they might be pregnant. Women of child-bearing potential will need to have a negative pregnancy test before starting this medicine. There is a potential for serious side effects to an unborn child. Talk to your health care professional or pharmacist for  more information. Do not breast-feed an infant while taking this medicine or for 7 months after stopping it. Women must use effective birth control with this medicine. What side effects may I notice from receiving this medicine? Side effects that you should report to your doctor or health care professional as soon as possible:  allergic reactions like skin rash, itching or hives, swelling of the face, lips, or tongue  chest pain or palpitations  cough  dizziness  feeling faint or lightheaded, falls  fever  general ill feeling or flu-like symptoms  signs of worsening heart failure like breathing problems; swelling in your legs and feet  unusually weak or tired Side effects that usually do not require medical attention (report to your doctor or health care professional if they continue or are bothersome):  bone pain  changes in taste  diarrhea  joint pain  nausea/vomiting  weight loss This list may not describe all possible side effects. Call your doctor for medical advice about side effects. You may report side effects to FDA at 1-800-FDA-1088. Where should I keep my medicine? This drug is given in a hospital or clinic and will not be stored at home. NOTE: This sheet is a summary. It may not cover all possible information. If you have questions about this medicine, talk to your doctor, pharmacist, or health care provider.  2021 Elsevier/Gold Standard (2016-04-29 14:37:52) Carboplatin injection What is this medicine? CARBOPLATIN (KAR boe pla tin) is a chemotherapy drug. It targets fast dividing cells, like cancer cells, and causes these cells to die. This medicine is used to treat ovarian cancer and many other cancers. This medicine may be used for other purposes; ask your health care provider or pharmacist if you have questions. COMMON BRAND NAME(S): Paraplatin What should I tell my health care provider before I take this medicine? They need to know if you have any of  these conditions:  blood disorders  hearing problems  kidney disease  recent or ongoing radiation therapy  an unusual or allergic reaction to carboplatin, cisplatin, other chemotherapy, other medicines, foods, dyes, or preservatives  pregnant or trying to get  pregnant  breast-feeding How should I use this medicine? This drug is usually given as an infusion into a vein. It is administered in a hospital or clinic by a specially trained health care professional. Talk to your pediatrician regarding the use of this medicine in children. Special care may be needed. Overdosage: If you think you have taken too much of this medicine contact a poison control center or emergency room at once. NOTE: This medicine is only for you. Do not share this medicine with others. What if I miss a dose? It is important not to miss a dose. Call your doctor or health care professional if you are unable to keep an appointment. What may interact with this medicine?  medicines for seizures  medicines to increase blood counts like filgrastim, pegfilgrastim, sargramostim  some antibiotics like amikacin, gentamicin, neomycin, streptomycin, tobramycin  vaccines Talk to your doctor or health care professional before taking any of these medicines:  acetaminophen  aspirin  ibuprofen  ketoprofen  naproxen This list may not describe all possible interactions. Give your health care provider a list of all the medicines, herbs, non-prescription drugs, or dietary supplements you use. Also tell them if you smoke, drink alcohol, or use illegal drugs. Some items may interact with your medicine. What should I watch for while using this medicine? Your condition will be monitored carefully while you are receiving this medicine. You will need important blood work done while you are taking this medicine. This drug may make you feel generally unwell. This is not uncommon, as chemotherapy can affect healthy cells as well  as cancer cells. Report any side effects. Continue your course of treatment even though you feel ill unless your doctor tells you to stop. In some cases, you may be given additional medicines to help with side effects. Follow all directions for their use. Call your doctor or health care professional for advice if you get a fever, chills or sore throat, or other symptoms of a cold or flu. Do not treat yourself. This drug decreases your body's ability to fight infections. Try to avoid being around people who are sick. This medicine may increase your risk to bruise or bleed. Call your doctor or health care professional if you notice any unusual bleeding. Be careful brushing and flossing your teeth or using a toothpick because you may get an infection or bleed more easily. If you have any dental work done, tell your dentist you are receiving this medicine. Avoid taking products that contain aspirin, acetaminophen, ibuprofen, naproxen, or ketoprofen unless instructed by your doctor. These medicines may hide a fever. Do not become pregnant while taking this medicine. Women should inform their doctor if they wish to become pregnant or think they might be pregnant. There is a potential for serious side effects to an unborn child. Talk to your health care professional or pharmacist for more information. Do not breast-feed an infant while taking this medicine. What side effects may I notice from receiving this medicine? Side effects that you should report to your doctor or health care professional as soon as possible:  allergic reactions like skin rash, itching or hives, swelling of the face, lips, or tongue  signs of infection - fever or chills, cough, sore throat, pain or difficulty passing urine  signs of decreased platelets or bleeding - bruising, pinpoint red spots on the skin, black, tarry stools, nosebleeds  signs of decreased red blood cells - unusually weak or tired, fainting spells,  lightheadedness  breathing problems  changes in hearing  changes in vision  chest pain  high blood pressure  low blood counts - This drug may decrease the number of white blood cells, red blood cells and platelets. You may be at increased risk for infections and bleeding.  nausea and vomiting  pain, swelling, redness or irritation at the injection site  pain, tingling, numbness in the hands or feet  problems with balance, talking, walking  trouble passing urine or change in the amount of urine Side effects that usually do not require medical attention (report to your doctor or health care professional if they continue or are bothersome):  hair loss  loss of appetite  metallic taste in the mouth or changes in taste This list may not describe all possible side effects. Call your doctor for medical advice about side effects. You may report side effects to FDA at 1-800-FDA-1088. Where should I keep my medicine? This drug is given in a hospital or clinic and will not be stored at home. NOTE: This sheet is a summary. It may not cover all possible information. If you have questions about this medicine, talk to your doctor, pharmacist, or health care provider.  2021 Elsevier/Gold Standard (2007-08-10 14:38:05)

## 2020-09-06 ENCOUNTER — Telehealth: Payer: Self-pay | Admitting: Hematology and Oncology

## 2020-09-06 NOTE — Telephone Encounter (Signed)
Patient called to get her last Chemo Appt's - Patient scheduled for 6/1 Infusion, 6/3 Injection.  Also per [facility], patient scheduled for 5/31 Labs, Follow Up.  Requested patient be given Updated Appt Summary at next Visit here on 4/22

## 2020-09-07 ENCOUNTER — Other Ambulatory Visit: Payer: Self-pay

## 2020-09-07 ENCOUNTER — Inpatient Hospital Stay: Payer: Medicare Other

## 2020-09-07 VITALS — BP 119/57 | HR 87 | Temp 98.1°F | Resp 18 | Ht 62.0 in | Wt 130.0 lb

## 2020-09-07 DIAGNOSIS — C50912 Malignant neoplasm of unspecified site of left female breast: Secondary | ICD-10-CM

## 2020-09-07 DIAGNOSIS — Z5112 Encounter for antineoplastic immunotherapy: Secondary | ICD-10-CM | POA: Diagnosis not present

## 2020-09-07 MED ORDER — PEGFILGRASTIM-BMEZ 6 MG/0.6ML ~~LOC~~ SOSY
6.0000 mg | PREFILLED_SYRINGE | Freq: Once | SUBCUTANEOUS | Status: AC
  Administered 2020-09-07: 6 mg via SUBCUTANEOUS

## 2020-09-07 MED ORDER — PEGFILGRASTIM-BMEZ 6 MG/0.6ML ~~LOC~~ SOSY
PREFILLED_SYRINGE | SUBCUTANEOUS | Status: AC
  Filled 2020-09-07: qty 0.6

## 2020-09-07 NOTE — Progress Notes (Signed)
1547: PT STABLE AT TIME OF DISCHARGE

## 2020-09-11 ENCOUNTER — Telehealth: Payer: Self-pay

## 2020-09-11 NOTE — Telephone Encounter (Addendum)
Pt called, and given appts for the morning.  ----- Message from Marvia Pickles, PA-C sent at 09/11/2020  3:56 PM EDT ----- Regarding: RE: "I'm so sick, can hardly get across the floor" Req an appt for IVF Contact: [phone] Put her in for labs and f/u with me in the morning. Thanks! ----- Message ----- From: Dairl Ponder, RN Sent: 09/11/2020   3:34 PM EDT To: Marvia Pickles, PA-C Subject: "I'm so sick, can hardly get across the floo#  Pt called to report, "I have had a very hard time since chemo last week. It's been pretty much like the chemo treatment before". Afebrile. Was constipated for 4 days, but used 2 suppositories and now is having diarrhea. I usually have diarrhea the whole time". I told her the use of the anti-emetics can cause constipation. She is taking nausea medications often, but, no emesis. She has mouth sores, states her "tongue feels like raw meat", even with use of MMW. Pt understandably tearful.

## 2020-09-12 ENCOUNTER — Other Ambulatory Visit: Payer: Self-pay | Admitting: Hematology and Oncology

## 2020-09-12 ENCOUNTER — Encounter: Payer: Self-pay | Admitting: Hematology and Oncology

## 2020-09-12 ENCOUNTER — Other Ambulatory Visit: Payer: Self-pay

## 2020-09-12 ENCOUNTER — Inpatient Hospital Stay: Payer: Medicare Other

## 2020-09-12 ENCOUNTER — Inpatient Hospital Stay (INDEPENDENT_AMBULATORY_CARE_PROVIDER_SITE_OTHER): Payer: Medicare Other | Admitting: Hematology and Oncology

## 2020-09-12 VITALS — BP 113/62 | HR 86 | Temp 98.5°F | Resp 18 | Ht 62.0 in | Wt 123.0 lb

## 2020-09-12 DIAGNOSIS — R112 Nausea with vomiting, unspecified: Secondary | ICD-10-CM | POA: Diagnosis not present

## 2020-09-12 DIAGNOSIS — C50911 Malignant neoplasm of unspecified site of right female breast: Secondary | ICD-10-CM

## 2020-09-12 DIAGNOSIS — E860 Dehydration: Secondary | ICD-10-CM

## 2020-09-12 DIAGNOSIS — C50011 Malignant neoplasm of nipple and areola, right female breast: Secondary | ICD-10-CM | POA: Diagnosis not present

## 2020-09-12 DIAGNOSIS — Z5112 Encounter for antineoplastic immunotherapy: Secondary | ICD-10-CM | POA: Diagnosis not present

## 2020-09-12 DIAGNOSIS — R42 Dizziness and giddiness: Secondary | ICD-10-CM | POA: Diagnosis not present

## 2020-09-12 DIAGNOSIS — C50012 Malignant neoplasm of nipple and areola, left female breast: Secondary | ICD-10-CM

## 2020-09-12 DIAGNOSIS — Z170 Estrogen receptor positive status [ER+]: Secondary | ICD-10-CM

## 2020-09-12 LAB — BASIC METABOLIC PANEL
BUN: 23 — AB (ref 4–21)
CO2: 26 — AB (ref 13–22)
Chloride: 99 (ref 99–108)
Creatinine: 0.6 (ref 0.5–1.1)
Glucose: 191
Potassium: 3.6 (ref 3.4–5.3)
Sodium: 135 — AB (ref 137–147)

## 2020-09-12 LAB — HEPATIC FUNCTION PANEL
ALT: 27 (ref 7–35)
AST: 24 (ref 13–35)
Alkaline Phosphatase: 101 (ref 25–125)
Bilirubin, Total: 1

## 2020-09-12 LAB — CBC AND DIFFERENTIAL
HCT: 31 — AB (ref 36–46)
Hemoglobin: 10.4 — AB (ref 12.0–16.0)
Neutrophils Absolute: 1.74
Platelets: 176 (ref 150–399)
WBC: 3

## 2020-09-12 LAB — CBC
MCV: 98 (ref 81–99)
RBC: 3.15 — AB (ref 3.87–5.11)

## 2020-09-12 LAB — COMPREHENSIVE METABOLIC PANEL
Albumin: 4.7 (ref 3.5–5.0)
Calcium: 9.9 (ref 8.7–10.7)

## 2020-09-12 MED ORDER — LORAZEPAM 0.5 MG PO TABS: 0.5000 mg | ORAL_TABLET | Freq: Four times a day (QID) | ORAL | 0 refills | Status: DC | PRN

## 2020-09-12 MED ORDER — SODIUM CHLORIDE 0.9 % IV SOLN
Freq: Once | INTRAVENOUS | Status: AC
  Filled 2020-09-12: qty 250

## 2020-09-12 MED ORDER — SODIUM CHLORIDE 0.9% FLUSH
10.0000 mL | Freq: Once | INTRAVENOUS | Status: AC | PRN
  Administered 2020-09-12: 10 mL
  Filled 2020-09-12: qty 10

## 2020-09-12 MED ORDER — HEPARIN SOD (PORK) LOCK FLUSH 100 UNIT/ML IV SOLN
500.0000 [IU] | Freq: Once | INTRAVENOUS | Status: AC | PRN
  Administered 2020-09-12: 500 [IU]
  Filled 2020-09-12: qty 5

## 2020-09-12 MED ORDER — DRONABINOL 5 MG PO CAPS: 5.0000 mg | ORAL_CAPSULE | Freq: Every day | ORAL | 2 refills | Status: DC

## 2020-09-12 NOTE — Progress Notes (Signed)
[facility]  [address] 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 Excisional biopsy revealed a 2 cm, grade 2, infiltrating ductal carcinoma in the left lower quadrant. Estrogen and progesterone receptors were positive and her 2 Neu negative. Tumor was present at the margins, so she underwent re-excision and axillary dissection. 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 Four of sixteen lymph nodes were positive for metastasis. All surgical margins were free of tumor involvement. She received adjuvant chemotherapy with with dose dense Adriamycin and Cytoxan for 4 cycles, followed by dose dense Taxol for 4 cycles, follwed by adjuvant radiation of the left breast. She was placed on anastrozole 1 mg daily in April 2005 and completed 10 years of therapy in April 2015. She underwent left latissimus dorsi muscle reconstruction of the left breast and right breast reduction. She underwent total abdominal hysterectomy and bilateral salpingo-oophorectomy prior to starting anastrozole. She had excision of a benign mass from the left breast in 2009. She underwent testing for BRCA mutations, including BART, in April 2013, which was negative, however, we did not have the results. She has a history of osteoporosis, previously treated with zolendronic acid every 6 months, which was discontinued when she completed anastrozole.   We began seeing her in July 2014, when she relocated to this area. Bone density scan in December 2015 was normal. Her personal  and family history wass suggestive of a hereditary cancer syndrome, so we recommended additional genetic testing with the My[employer] myRisk Update Panel test to look for a mutation in other genes known to increase the risk for breast cancer and as well as other cancers. 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 These results were reviewed with her in detail and she was scheduled for an breast MRI which did not reveal any evidence of malignancy. She was unsure about undergoing prophylactic bilateral mastectomies. She was seen by Dr. Coralie Keens who referred her to Dr. Henrietta Hoover regarding reconstructive surgery. She was interested in reconstruction and the plastic surgeon that she saw discouraged her because of the increased risk of complications with surgery on previously irradiated tissue. The patient so far has continued close surveillance with annual mammogram and breast MRI spaced out 6 months. Bone density in January 2019was normal. Breast MRI in August 2019 did not reveal any evidence of malignancyShe developed diabetes and had an MRI abdomen, which did not reveal any evidence of pancreatic cancer. She  had colonoscopy in early 2020by Dr. Marcell Anger in [place] and had 2 polyps removed. Repeat colonoscopy in 5 years was recommended. Bilateral screening mammogram in March 2020 and April 2021 did not reveal any evidence of malignancy.  She was seen in January 2022 with a new palpable mass in the left breast. Diagnostic left mammogram revealed a suspicious mass measuring 2.0 cm at 10 o'clock, which accounted for the patients palpable area of  concern. Diagnostic right mammogram did not reveal any evidence of malignancy. Ultrasound guided biopsy revealed poorly differentiated carcinoma in a background of adipose tissue with  fibrosis and necrosis, consistent with breast origin. HER2 positive. Estrogen receptor was 30% positive and progesterone receptor was negative. Ki67 was 25%. However, there was no breast tissue in the sample, so this was felt to be more likely a recurrence rather than a new breast primary, even though it has been many years. Her previous cancer was HER2 negative and ER positive.   We recommended neoadjuvant chemotherapy/HER2 targeted therapy with docetaxel/carboplatin/trastuzumab/pertuzumab [facility]) which she started treatment with [facility] on February 15th.  She has experienced decreased appetite, taste changes, mouth sores, and diarrhea despite imodium. She was given a prescription for Lomotil to better control her diarrhea. MBX has been effective for her mouth sores  She also had heartburn for which omeprazole was effective.  She received a 3rd cycle of TCHP on March 30th.  At that time she continued to report diarrhea, but was not alternating Lomotil and Imodium.  She has continued to lose weight, as she has not been eating well due to taste changes.  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  INTERVAL HISTORY:  [name] is added to the schedule today reporting intractable nausea and vomiting despite Zofran and Compazine and she feels she is dehydrated.  This has been occuring since her last chemotherapy on April 20th. She is not eating or drinking, as she is unable to keep anything down.  She also reports severe dizziness, no matter what position.  She also reports blurry vision. She had dizziness with standing previously felt to be due to orthostatic hypotension, which resolved with IV fluids. She remains fatigued with general weakness, but denies paresthesias or focal weakness. She states her mouth sores are managed with MBX solution. She has mainly struggled with fatigue and weakness, anorexia, taste changes,  weight loss and severe diarrhea with her chemotherapy regimen. She states she recently became constipated She denies fevers or chills. She denies pain. Her appetite is good. Her weight has decreased 7 pounds over last 1 week. She has lost a total of 23 lb in 2 months.  REVIEW OF SYSTEMS:  Review of Systems  Constitutional: Positive for appetite change and unexpected weight change. Negative for chills, fatigue and fever.  HENT:   Positive for mouth sores (tongue very raw). Negative for lump/mass and sore throat.   Eyes: Positive for eye problems (blurry vision).  Respiratory: Negative for cough and shortness of breath.   Cardiovascular: Negative for chest pain and leg swelling.  Gastrointestinal: Positive for nausea (intractable) and vomiting (intractable). Negative for abdominal pain, constipation and diarrhea.  Endocrine: Negative for hot flashes.  Genitourinary: Negative for difficulty urinating, dysuria, frequency and hematuria.   Musculoskeletal: Negative for arthralgias, back pain and myalgias.  Skin: Negative for rash.  Neurological: Negative for dizziness and headaches.  Hematological: Negative for adenopathy. Does not bruise/bleed easily.  Psychiatric/Behavioral: Positive for depression (despite medication). Negative for sleep disturbance. The patient is nervous/anxious.     VITALS:  Blood pressure (!) 110/59, pulse (!) 118, temperature 97.8 F (36.6 C), temperature source Oral, resp. rate 18, height 5' 2" (1.575 m), weight 123 lb (55.8 kg), SpO2 98 %.  Wt Readings from Last 3 Encounters:  09/12/20 123 lb (55.8 kg)  09/07/20 130 lb (59 kg)  09/05/20 132 lb (59.9 kg)  Body mass index is 22.5 kg/m.  Performance status (ECOG): 3 - Symptomatic, >50% confined to bed  PHYSICAL EXAM:  Physical Exam Vitals and nursing note reviewed.  Constitutional:      General: She is not in acute distress.    Appearance: Normal appearance.  HENT:     Head: Normocephalic and atraumatic.      Mouth/Throat:     Mouth: Mucous membranes are moist.     Pharynx: Oropharynx is clear. No oropharyngeal exudate or posterior oropharyngeal erythema.  Eyes:     General: No scleral icterus.    Extraocular Movements: Extraocular movements intact.     Conjunctiva/sclera: Conjunctivae normal.     Pupils: Pupils are equal, round, and reactive to light.  Cardiovascular:     Rate and Rhythm: Normal rate and regular rhythm.     Heart sounds: Normal heart sounds. No murmur heard. No friction rub. No gallop.   Pulmonary:     Effort: Pulmonary effort is normal.     Breath sounds: Normal breath sounds. No wheezing, rhonchi or rales.  Chest:  Breasts:     Right: Normal. No swelling, bleeding, inverted nipple, mass, nipple discharge, skin change, tenderness, axillary adenopathy or supraclavicular adenopathy.     Left: Mass (1 cm nodule LUIQ, with generalized firmness of the left breast) present. No swelling, bleeding, inverted nipple, nipple discharge, skin change, tenderness, axillary adenopathy or supraclavicular adenopathy.    Abdominal:     General: There is no distension.     Palpations: Abdomen is soft. There is no hepatomegaly, splenomegaly or mass.     Tenderness: There is no abdominal tenderness.  Musculoskeletal:        General: Normal range of motion.     Cervical back: Normal range of motion and neck supple. No tenderness.     Right lower leg: No edema.     Left lower leg: No edema.  Lymphadenopathy:     Cervical: No cervical adenopathy.     Upper Body:     Right upper body: No supraclavicular or axillary adenopathy.     Left upper body: No supraclavicular or axillary adenopathy.     Lower Body: No right inguinal adenopathy. No left inguinal adenopathy.  Skin:    General: Skin is warm and dry.     Coloration: Skin is not jaundiced.     Findings: No rash.  Neurological:     Mental Status: She is alert and oriented to person, place, and time.     Cranial Nerves: No cranial nerve  deficit.     Sensory: Sensation is intact.     Motor: Motor function is intact.     Coordination: Finger-Nose-Finger Test normal. Rapid alternating movements normal.  Psychiatric:        Mood and Affect: Mood normal.        Behavior: Behavior normal.        Thought Content: Thought content normal.    LABS:   CBC Latest Ref Rng & Units 09/12/2020 08/23/2020 08/10/2020  WBC - 3.0 22.1 12.4  Hemoglobin 12.0 - 16.0 10.4(A) 10.4(A) 11.0(A)  Hematocrit 36 - 46 31(A) 32(A) 33(A)  Platelets 150 - 399 176 204 237   CMP Latest Ref Rng & Units 09/12/2020 08/31/2020 08/23/2020  Glucose 70 - 99 mg/dL - - -  BUN 4 - 21 23(A) 5 20  Creatinine 0.5 - 1.1 0.6 0.7 1.9(A)  Sodium 137 - 147 135(A) 140 134(A)  Potassium 3.4 - 5.3 3.6 4.2 3.3(A)  Chloride 99 - 108 99 102 96(A)  CO2 13 - 22 26(A) 32(A) 28(A)  Calcium 8.7 - 10.7 9.9 9.4 9.3  Alkaline Phos 25 - 125 101 110 153(A)  AST 13 - 35 24 34 24  ALT 7 - 35 _0 No results found for: CEA1 / No results found for: CEA1 No results found for: PSA1 No results found for: FBP102 No results found for: CAN125  No results found for: TOTALPROTELP, ALBUMINELP, A1GS, A2GS, BETS, BETA2SER, GAMS, MSPIKE, SPEI No results found for: TIBC, FERRITIN, IRONPCTSAT No results found for: LDH  STUDIES:  No results found.    HISTORY:   Past Medical History:  Diagnosis Date  . Cancer ([place])   . Depression   . Diabetes mellitus without complication ([place])   . Hyperlipidemia   . Hypomagnesemia 08/10/2020  . Sleep apnea     Past Surgical History:  Procedure Laterality Date  . ABDOMINAL HYSTERECTOMY    . BREAST SURGERY Left   . HYSTERECTOMY ABDOMINAL WITH SALPINGO-OOPHORECTOMY  07/2003   due to fibroids  . PORTACATH PLACEMENT Right 06/26/2020   Procedure: INSERTION PORT-A-CATH;  Surgeon: [name], MD;  Location: [facility];  Service: General;  Laterality: Right;  . TRIGGER FINGER RELEASE      Family History  Problem Relation Age  of Onset  . Breast cancer Mother 83  . Breast cancer Maternal Aunt 25  . Ovarian cancer Maternal Grandmother 66  . Bone cancer Maternal Grandfather        49s  . Cancer Maternal Aunt 83    Social History:  reports that she has never smoked. She has never used smokeless tobacco. She reports current alcohol use. No history on file for drug use.The patient is alone today.  Allergies:  Allergies  Allergen Reactions  . Oxycodone Itching  . Codeine Other (See Comments)    Other reaction(s): Other (See Comments) Unknown Unknown   . Lisinopril     Other reaction(s): Cough (ALLERGY/intolerance)    Current Medications: Current Outpatient Medications  Medication Sig Dispense Refill  . aspirin 81 MG chewable tablet Chew by mouth.    . benzonatate (TESSALON) 100 MG capsule     . calcium-vitamin D 250-100 MG-UNIT tablet Take by mouth.    . celecoxib (CELEBREX) 100 MG capsule Take by mouth.    . dexamethasone (DECADRON) 4 MG tablet Take 2 tablets (8 mg total) by mouth 2 (two) times daily. Start the day before Taxotere. Then take daily x 3 days after chemotherapy. (Patient taking differently: Take 4 mg by mouth 2 (two) times daily. Start the day before Taxotere. Then take daily x 3 days after chemotherapy.) 30 tablet 1  . diphenoxylate-atropine (LOMOTIL) 2.5-0.025 MG tablet Take 2 tablets by mouth 4 (four) times daily as needed for diarrhea or loose stools. 60 tablet 3  . Docusate Sodium (DSS) 100 MG CAPS Take by mouth.    . DULoxetine (CYMBALTA) 60 MG capsule Take 1 capsule by mouth daily.    Marland Kitchen FLUoxetine (PROZAC) 40 MG capsule Take by mouth.    . fluticasone (FLONASE) 50 MCG/ACT nasal spray     . HYDROcodone-acetaminophen (NORCO) 7.5-325 MG tablet Take 1 tablet by mouth every 4 (four) hours as needed for moderate pain. 120 tablet 0  . lidocaine-prilocaine (EMLA) cream Apply to affected area once 30 g 3  . losartan (COZAAR) 50 MG tablet daily.    . magic mouthwash (nystatin, lidocaine,  diphenhydrAMINE) suspension Take  5 mLs by mouth every 3 (three) hours as needed for mouth pain. Swish & Spit; [facility]m bottle [facility] 1305 per Melissa P,NP    . magnesium oxide (MAG-OX) 400 (241.3 Mg) MG tablet Take 1 tablet (400 mg total) by mouth 2 (two) times daily. 60 tablet 1  . metFORMIN (GLUCOPHAGE) 500 MG tablet 2 (two) times daily.    . Multiple Vitamins-Minerals (THERA-M) TABS Take by mouth.    . ondansetron (ZOFRAN) 8 MG tablet Take 1 tablet (8 mg total) by mouth 2 (two) times daily as needed (Nausea or vomiting). Start on the third day after chemotherapy. 30 tablet 1  . prochlorperazine (COMPAZINE) 10 MG tablet Take 1 tablet (10 mg total) by mouth every 6 (six) hours as needed (Nausea or vomiting). 30 tablet 1  . rosuvastatin (CRESTOR) 40 MG tablet daily.    . traMADol (ULTRAM) 50 MG tablet Take 1-2 tablets (50-100 mg total) by mouth every 6 (six) hours as needed. 60 tablet 0  . vitamin B-12 (CYANOCOBALAMIN) 500 MCG tablet Take 500 mcg by mouth daily.     No current facility-administered medications for this visit.     ASSESSMENT & PLAN:   Assessment/Plan:  1.  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 This is felt to most likely represent a recurrence rather than a 2nd breast primary. There really is no way to prove this and the slides from her prior cancer are no longer available. She is receiving neoadjuvant TCHP  and having much difficulty tolerating it.   2. Diarrhea, she was recently constipated, so took laxatives.  3. Neuropathy, which is stable.  4. Hypomagnesemia, for which she is on oral magnesium supplement. Magnesium is normal.  5. Taste changes, decreased appetite and weight loss.  She has lost another 7 lb. Her husband asks about  something for appetite.  The patient states that when she takes her steroids for chemotherapy she does not feel these help her appetite.  She is willing to try dronabinol and I will start her on 5 mg at bedtime.  6. Intractable nausea and vomiting despite Zofran and Compazine.  I will add Ativan 0.5 mg every 6 hours as needed and she can continue alternating the Zofran and Compazine, as well.  7. Clinical dehydration, I will give her IV fluids today.  8. Persistent dizziness no matter her position, as well as blurry vision. This combined with the intractable nausea and vomiting are very concerning for possible intracranial metastasis.  I will obtain a stat MRI to evaluate for this.  If the MRI does not reveal any evidence of intracranial metastasis, we will plan to decrease the doses of chemotherapy due to the severe toxicities.  We will plan to see her back on May 6th with a CBC and comprehensive metabolic panel as previously scheduled. The patient and her husband understand the plans discussed today and are in agreement with them. They know to contact our office if she develops concerns prior to her next appointment.     KMarvia Pickles PA-C    Addendum:  The patient felt better after receiving IV fluids.  MRI brain with and without contrast did not reveal any evidence of metastatic disease or other acute intracranial abnormality.  I discussed her case with Dr. LBobby Rumpfand he recommends  attempting a 5th cycle of chemotherapy with a 25% dose reduction.  The patient is agreeable to this.  If her nausea and vomiting is not controlled with the addition of lorazepam and dronabinol or she continues to be unable to maintain her fluid intake, she will contact us.

## 2020-09-13 NOTE — Progress Notes (Signed)
Reduce docetaxel and carboplatin doses by 25% starting with cycle 5 due to poor tolerance per [facility].

## 2020-09-14 ENCOUNTER — Inpatient Hospital Stay: Payer: Medicare Other

## 2020-09-14 ENCOUNTER — Other Ambulatory Visit: Payer: Self-pay

## 2020-09-14 ENCOUNTER — Other Ambulatory Visit: Payer: Self-pay | Admitting: Hematology and Oncology

## 2020-09-14 VITALS — BP 129/71 | HR 95 | Temp 98.1°F | Resp 18

## 2020-09-14 DIAGNOSIS — Z5112 Encounter for antineoplastic immunotherapy: Secondary | ICD-10-CM | POA: Diagnosis not present

## 2020-09-14 DIAGNOSIS — E860 Dehydration: Secondary | ICD-10-CM

## 2020-09-14 MED ORDER — SODIUM CHLORIDE 0.9% FLUSH
10.0000 mL | Freq: Once | INTRAVENOUS | Status: AC | PRN
  Administered 2020-09-14: 10 mL
  Filled 2020-09-14: qty 10

## 2020-09-14 MED ORDER — SODIUM CHLORIDE 0.9 % IV SOLN
Freq: Once | INTRAVENOUS | Status: AC
  Filled 2020-09-14: qty 250

## 2020-09-14 MED ORDER — HEPARIN SOD (PORK) LOCK FLUSH 100 UNIT/ML IV SOLN
500.0000 [IU] | Freq: Once | INTRAVENOUS | Status: AC | PRN
  Administered 2020-09-14: 500 [IU]
  Filled 2020-09-14: qty 5

## 2020-09-14 NOTE — Patient Instructions (Signed)
Dehydration, Adult Dehydration is condition in which there is not enough water or other fluids in the body. This happens when a person loses more fluids than he or she takes in. Important body parts cannot work right without the right amount of fluids. Any loss of fluids from the body can cause dehydration. Dehydration can be mild, worse, or very bad. It should be treated right away to keep it from getting very bad. What are the causes? This condition may be caused by:  Conditions that cause loss of water or other fluids, such as: ? Watery poop (diarrhea). ? Vomiting. ? Sweating a lot. ? Peeing (urinating) a lot.  Not drinking enough fluids, especially when you: ? Are ill. ? Are doing things that take a lot of energy to do.  Other illnesses and conditions, such as fever or infection.  Certain medicines, such as medicines that take extra fluid out of the body (diuretics).  Lack of safe drinking water.  Not being able to get enough water and food. What increases the risk? The following factors may make you more likely to develop this condition:  Having a long-term (chronic) illness that has not been treated the right way, such as: ? Diabetes. ? Heart disease. ? Kidney disease.  Being 65 years of age or older.  Having a disability.  Living in a place that is high above the ground or sea (high in altitude). The thinner, dried air causes more fluid loss.  Doing exercises that put stress on your body for a long time. What are the signs or symptoms? Symptoms of dehydration depend on how bad it is. Mild or worse dehydration  Thirst.  Dry lips or dry mouth.  Feeling dizzy or light-headed, especially when you stand up from sitting.  Muscle cramps.  Your body making: ? Dark pee (urine). Pee may be the color of tea. ? Less pee than normal. ? Less tears than normal.  Headache. Very bad dehydration  Changes in skin. Skin may: ? Be cold to the touch (clammy). ? Be blotchy  or pale. ? Not go back to normal right after you lightly pinch it and let it go.  Little or no tears, pee, or sweat.  Changes in vital signs, such as: ? Fast breathing. ? Low blood pressure. ? Weak pulse. ? Pulse that is more than 100 beats a minute when you are sitting still.  Other changes, such as: ? Feeling very thirsty. ? Eyes that look hollow (sunken). ? Cold hands and feet. ? Being mixed up (confused). ? Being very tired (lethargic) or having trouble waking from sleep. ? Short-term weight loss. ? Loss of consciousness. How is this treated? Treatment for this condition depends on how bad it is. Treatment should start right away. Do not wait until your condition gets very bad. Very bad dehydration is an emergency. You will need to go to a hospital.  Mild or worse dehydration can be treated at home. You may be asked to: ? Drink more fluids. ? Drink an oral rehydration solution (ORS). This drink helps get the right amounts of fluids and salts and minerals in the blood (electrolytes).  Very bad dehydration can be treated: ? With fluids through an IV tube. ? By getting normal levels of salts and minerals in your blood. This is often done by giving salts and minerals through a tube. The tube is passed through your nose and into your stomach. ? By treating the root cause. Follow these instructions at   home: Oral rehydration solution If told by your doctor, drink an ORS:  Make an ORS. Use instructions on the package.  Start by drinking small amounts, about  cup (120 mL) every 5-10 minutes.  Slowly drink more until you have had the amount that your doctor said to have. Eating and drinking  Drink enough clear fluid to keep your pee pale yellow. If you were told to drink an ORS, finish the ORS first. Then, start slowly drinking other clear fluids. Drink fluids such as: ? Water. Do not drink only water. Doing that can make the salt (sodium) level in your body get too low. ? Water  from ice chips you suck on. ? Fruit juice that you have added water to (diluted). ? Low-calorie sports drinks.  Eat foods that have the right amounts of salts and minerals, such as: ? Bananas. ? Oranges. ? Potatoes. ? Tomatoes. ? Spinach.  Do not drink alcohol.  Avoid: ? Drinks that have a lot of sugar. These include:  High-calorie sports drinks.  Fruit juice that you did not add water to.  Soda.  Caffeine. ? Foods that are greasy or have a lot of fat or sugar.         General instructions  Take over-the-counter and prescription medicines only as told by your doctor.  Do not take salt tablets. Doing that can make the salt level in your body get too high.  Return to your normal activities as told by your doctor. Ask your doctor what activities are safe for you.  Keep all follow-up visits as told by your doctor. This is important. Contact a doctor if:  You have pain in your belly (abdomen) and the pain: ? Gets worse. ? Stays in one place.  You have a rash.  You have a stiff neck.  You get angry or annoyed (irritable) more easily than normal.  You are more tired or have a harder time waking than normal.  You feel: ? Weak or dizzy. ? Very thirsty. Get help right away if you have:  Any symptoms of very bad dehydration.  Symptoms of vomiting, such as: ? You cannot eat or drink without vomiting. ? Your vomiting gets worse or does not go away. ? Your vomit has blood or green stuff in it.  Symptoms that get worse with treatment.  A fever.  A very bad headache.  Problems with peeing or pooping (having a bowel movement), such as: ? Watery poop that gets worse or does not go away. ? Blood in your poop (stool). This may cause poop to look black and tarry. ? Not peeing in 6-8 hours. ? Peeing only a small amount of very dark pee in 6-8 hours.  Trouble breathing. These symptoms may be an emergency. Do not wait to see if the symptoms will go away. Get  medical help right away. Call your local emergency services (911 in the [place] Do not drive yourself to the hospital. Summary  Dehydration is a condition in which there is not enough water or other fluids in the body. This happens when a person loses more fluids than he or she takes in.  Treatment for this condition depends on how bad it is. Treatment should be started right away. Do not wait until your condition gets very bad.  Drink enough clear fluid to keep your pee pale yellow. If you were told to drink an oral rehydration solution (ORS), finish the ORS first. Then, start slowly drinking other clear fluids.    Take over-the-counter and prescription medicines only as told by your doctor.  Get help right away if you have any symptoms of very bad dehydration. This information is not intended to replace advice given to you by your health care provider. Make sure you discuss any questions you have with your health care provider. Document Revised: 12/16/2018 Document Reviewed: 12/16/2018 [employer] Patient Education  2021 [employer]  

## 2020-09-18 ENCOUNTER — Encounter: Payer: Self-pay | Admitting: Hematology and Oncology

## 2020-09-20 DIAGNOSIS — I351 Nonrheumatic aortic (valve) insufficiency: Secondary | ICD-10-CM | POA: Diagnosis not present

## 2020-09-20 DIAGNOSIS — I361 Nonrheumatic tricuspid (valve) insufficiency: Secondary | ICD-10-CM | POA: Diagnosis not present

## 2020-09-20 NOTE — Progress Notes (Signed)
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 Excisional biopsy revealed a 2 cm, grade 2, infiltrating ductal carcinoma in the left lower quadrant. Estrogen and progesterone receptors were positive and her 2 Neu negative. Tumor was present at the margins, so she underwent re-excision and axillary dissection. 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 Four of sixteen lymph nodes were positive for metastasis. All surgical margins were free of tumor involvement. She received adjuvant chemotherapy with with dose dense Adriamycin and Cytoxan for 4 cycles, followed by dose dense Taxol for 4 cycles, follwed by adjuvant radiation of the left breast. She was placed on anastrozole 1 mg daily in April 2005 and completed 10 years of therapy in April 2015. She underwent left latissimus dorsi muscle reconstruction of the left breast and right breast reduction. She underwent total abdominal hysterectomy and bilateral salpingo-oophorectomy prior to starting anastrozole. She had excision of a benign mass from the left breast in 2009. She underwent testing for BRCA mutations, including BART, in April 2013, which was negative, however, we did not have the results. She has a history of osteoporosis, previously treated with zolendronic acid every 6 months, which was discontinued when she completed anastrozole.   We began seeing her in July 2014, when she relocated to this  area. Bone density scan in December 2015 was normal. Her personal and family history wass suggestive of a hereditary cancer syndrome, so we recommended additional genetic testing with the Myriad myRisk Update Panel test to look for a mutation in other genes known to increase the risk for breast cancer and as well as other cancers. 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 These results were reviewed with her in detail and she was scheduled for an breast MRI which did not reveal any evidence of malignancy. She was unsure about undergoing prophylactic bilateral mastectomies. She was seen by Dr. Coralie Keens who referred her to Dr. Henrietta Hoover regarding reconstructive surgery. She was interested in reconstruction and the plastic surgeon that she saw discouraged her because of the increased risk of complications with surgery on previously irradiated tissue. The patient so far has continued close surveillance with annual mammogram and breast MRI spaced out 6 months. Bone density in January 2019was normal. Breast MRI in August 2019 did not reveal any evidence of malignancyShe developed diabetes and had an MRI abdomen, which did not reveal any evidence of pancreatic cancer. She  had colonoscopy in early 2020by Dr. Marcell Anger in [place] and had 2 polyps removed. Repeat colonoscopy in 5 years was recommended. Bilateral screening mammogram in March 2020 and April 2021 did not reveal any evidence of malignancy.  She was seen in January 2022 with a new palpable mass in the left breast. Diagnostic left mammogram revealed a suspicious mass measuring 2.0 cm  at 10 o'clock, which accounted for the patients palpable area of concern. Diagnostic right mammogram did not reveal any evidence of malignancy. Ultrasound guided biopsy revealed poorly  differentiated carcinoma in a background of adipose tissue with fibrosis and necrosis, consistent with breast origin. HER2 positive. Estrogen receptor was 30% positive and progesterone receptor was negative. Ki67 was 25%. However, there was no breast tissue in the sample, so this was felt to be more likely a recurrence rather than a new breast primary, even though it has been many years. Her previous cancer was HER2 negative and ER positive.   We recommended neoadjuvant chemotherapy/HER2 targeted therapy with docetaxel/carboplatin/trastuzumab/pertuzumab [facility]) which she started treatment with [facility] on February 15th.  She has experienced decreased appetite, taste changes, mouth sores, and diarrhea despite imodium. She was given a prescription for Lomotil to better control her diarrhea. MBX has been effective for her mouth sores  She also had heartburn for which omeprazole was effective.  She received a 3rd cycle of TCHP on March 30th.  At that time she continued to report diarrhea, but was not alternating Lomotil and Imodium.  She has continued to lose weight, as she has not been eating well due to taste changes.  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  INTERVAL HISTORY:  Sherby is here for evaluation prior to Day 1, Cycle 5. She had difficulties with the previous cycle in the form of mouth sores, nausea, vomiting and dehydration. She also had severe dizziness and blurry vision. MRI of the brain proved negative at last visit and she improved after IVF in the clinic. Today she is much better. She denies fever, chills, nausea or vomiting. She denies issue with bowel or bladder. She denies shortness of breath, chest pain or cough. Her dizziness has resolved. Her appetite has improved and she has gained 5 pounds.  CBC and CMP are unremarkable today other than a mildly decreased potassium. REVIEW OF SYSTEMS:  Review  of Systems  Constitutional: Negative for chills, diaphoresis, fatigue and fever.  HENT:   Negative for hearing loss, lump/mass, nosebleeds, sore throat, tinnitus, trouble swallowing and voice change. Mouth sores: tongue very raw.   Eyes: Negative for icterus. Eye problems: blurry vision.  Respiratory: Negative for chest tightness, cough, hemoptysis, shortness of breath and wheezing.   Cardiovascular: Negative for chest pain, leg swelling and palpitations.  Gastrointestinal: Negative for abdominal distention, abdominal pain, blood in stool, constipation, diarrhea and rectal pain. Nausea: intractable. Vomiting: intractable.  Endocrine: Negative for hot flashes.  Genitourinary: Negative for bladder incontinence, difficulty urinating, dyspareunia, dysuria, frequency, hematuria and nocturia.   Musculoskeletal: Negative for arthralgias, back pain, flank pain, gait problem, myalgias, neck pain and neck stiffness.  Skin: Negative for itching, rash and wound.  Neurological: Negative for dizziness, extremity weakness, gait problem, headaches, light-headedness, numbness, seizures and speech difficulty.  Hematological: Negative for adenopathy. Does not bruise/bleed easily.  Psychiatric/Behavioral: Positive for depression (despite medication). Negative for confusion, decreased concentration, sleep disturbance and suicidal ideas. The patient is nervous/anxious.     VITALS:  Blood pressure 118/65, pulse 87, temperature 98.5 F (36.9 C), temperature source Oral, resp. rate 18, height 5' 2"  (1.575 m), weight 127 lb 1.9 oz (57.7 kg), SpO2 100 %.  Wt Readings from Last 3 Encounters:  09/21/20 127 lb 1.9 oz (57.7 kg)  09/12/20 123 lb (55.8 kg)  09/07/20 130 lb (59 kg)    Body mass  index is 23.25 kg/m.  Performance status (ECOG): 3 - Symptomatic, >50% confined to bed  PHYSICAL EXAM:  Physical Exam Vitals and nursing note reviewed.  Constitutional:      General: She is not in acute distress.     Appearance: Normal appearance. She is normal weight. She is not ill-appearing, toxic-appearing or diaphoretic.  HENT:     Head: Normocephalic and atraumatic.     Nose: Nose normal. No congestion or rhinorrhea.     Mouth/Throat:     Mouth: Mucous membranes are moist.     Pharynx: Oropharynx is clear. No oropharyngeal exudate or posterior oropharyngeal erythema.  Eyes:     General: No scleral icterus.       Right eye: No discharge.        Left eye: No discharge.     Extraocular Movements: Extraocular movements intact.     Conjunctiva/sclera: Conjunctivae normal.     Pupils: Pupils are equal, round, and reactive to light.  Neck:     Vascular: No carotid bruit.  Cardiovascular:     Rate and Rhythm: Normal rate and regular rhythm.     Heart sounds: Normal heart sounds. No murmur heard. No friction rub. No gallop.   Pulmonary:     Effort: Pulmonary effort is normal. No respiratory distress.     Breath sounds: Normal breath sounds. No stridor. No wheezing, rhonchi or rales.  Chest:     Chest wall: No tenderness.  Breasts:     Right: Normal. No swelling, bleeding, inverted nipple, mass, nipple discharge, skin change, tenderness, axillary adenopathy or supraclavicular adenopathy.     Left: Mass (1 cm nodule LUIQ, with generalized firmness of the left breast) present. No swelling, bleeding, inverted nipple, nipple discharge, skin change, tenderness, axillary adenopathy or supraclavicular adenopathy.    Abdominal:     General: Abdomen is flat. Bowel sounds are normal. There is no distension.     Palpations: Abdomen is soft. There is no hepatomegaly, splenomegaly or mass.     Tenderness: There is no abdominal tenderness. There is no right CVA tenderness, left CVA tenderness, guarding or rebound.     Hernia: No hernia is present.  Musculoskeletal:        General: No swelling, tenderness, deformity or signs of injury. Normal range of motion.     Cervical back: Normal range of motion and neck  supple. No rigidity or tenderness.     Right lower leg: No edema.     Left lower leg: No edema.  Lymphadenopathy:     Cervical: No cervical adenopathy.     Upper Body:     Right upper body: No supraclavicular or axillary adenopathy.     Left upper body: No supraclavicular or axillary adenopathy.     Lower Body: No right inguinal adenopathy. No left inguinal adenopathy.  Skin:    General: Skin is warm and dry.     Capillary Refill: Capillary refill takes less than 2 seconds.     Coloration: Skin is not jaundiced or pale.     Findings: No bruising, erythema, lesion or rash.  Neurological:     General: No focal deficit present.     Mental Status: She is alert and oriented to person, place, and time. Mental status is at baseline.     Cranial Nerves: No cranial nerve deficit.     Sensory: Sensation is intact. No sensory deficit.     Motor: Motor function is intact. No weakness.     Coordination:  Coordination normal. Finger-Nose-Finger Test normal. Rapid alternating movements normal.     Gait: Gait normal.     Deep Tendon Reflexes: Reflexes normal.  Psychiatric:        Mood and Affect: Mood normal.        Behavior: Behavior normal.        Thought Content: Thought content normal.        Judgment: Judgment normal.    LABS:   CBC Latest Ref Rng & Units 09/21/2020 09/12/2020 08/23/2020  WBC - 5.7 3.0 22.1  Hemoglobin 12.0 - 16.0 9.6(A) 10.4(A) 10.4(A)  Hematocrit 36 - 46 29(A) 31(A) 32(A)  Platelets 150 - 399 227 176 204   CMP Latest Ref Rng & Units 09/21/2020 09/12/2020 08/31/2020  Glucose 70 - 99 mg/dL - - -  BUN 4 - 21 8 23(A) 5  Creatinine 0.5 - 1.1 0.4(A) 0.6 0.7  Sodium 137 - 147 139 135(A) 140  Potassium 3.4 - 5.3 3.3(A) 3.6 4.2  Chloride 99 - 108 104 99 102  CO2 13 - 22 31(A) 26(A) 32(A)  Calcium 8.7 - 10.7 9.1 9.9 9.4  Alkaline Phos 25 - 125 88 101 110  AST 13 - 35 21 24 34  ALT 7 - 35 23 27 25      No results found for: CEA1 / No results found for: CEA1 No results found  for: PSA1 No results found for: LHT342 No results found for: CAN125  No results found for: TOTALPROTELP, ALBUMINELP, A1GS, A2GS, BETS, BETA2SER, GAMS, MSPIKE, SPEI No results found for: TIBC, FERRITIN, IRONPCTSAT No results found for: LDH  STUDIES:  No results found.    HISTORY:   Past Medical History:  Diagnosis Date  . Cancer ([place])   . Depression   . Diabetes mellitus without complication ([place])   . Hyperlipidemia   . Hypomagnesemia 08/10/2020  . Sleep apnea     Past Surgical History:  Procedure Laterality Date  . ABDOMINAL HYSTERECTOMY    . BREAST SURGERY Left   . HYSTERECTOMY ABDOMINAL WITH SALPINGO-OOPHORECTOMY  07/2003   due to fibroids  . PORTACATH PLACEMENT Right 06/26/2020   Procedure: INSERTION PORT-A-CATH;  Surgeon: [name], MD;  Location: [facility];  Service: General;  Laterality: Right;  . TRIGGER FINGER RELEASE      Family History  Problem Relation Age of Onset  . Breast cancer Mother 81  . Breast cancer Maternal Aunt 25  . Ovarian cancer Maternal Grandmother 45  . Bone cancer Maternal Grandfather        9s  . Cancer Maternal Aunt 7    Social History:  reports that she has never smoked. She has never used smokeless tobacco. She reports current alcohol use. No history on file for drug use.The patient is alone today.  Allergies:  Allergies  Allergen Reactions  . Oxycodone Itching  . Codeine Other (See Comments)    Other reaction(s): Other (See Comments) Unknown Unknown   . Lisinopril     Other reaction(s): Cough (ALLERGY/intolerance)    Current Medications: Current Outpatient Medications  Medication Sig Dispense Refill  . aspirin 81 MG chewable tablet Chew by mouth.    . benzonatate (TESSALON) 100 MG capsule     . calcium-vitamin D 250-100 MG-UNIT tablet Take by mouth.    . celecoxib (CELEBREX) 100 MG capsule Take by mouth.    . dexamethasone (DECADRON) 4 MG tablet Take 2 tablets (8 mg total) by mouth 2 (two) times  daily. Start the day before Taxotere.  Then take daily x 3 days after chemotherapy. (Patient taking differently: Take 4 mg by mouth 2 (two) times daily. Start the day before Taxotere. Then take daily x 3 days after chemotherapy.) 30 tablet 1  . diphenoxylate-atropine (LOMOTIL) 2.5-0.025 MG tablet Take 2 tablets by mouth 4 (four) times daily as needed for diarrhea or loose stools. 60 tablet 3  . Docusate Sodium (DSS) 100 MG CAPS Take by mouth.    . dronabinol (MARINOL) 5 MG capsule Take 1 capsule (5 mg total) by mouth at bedtime. 30 capsule 2  . DULoxetine (CYMBALTA) 60 MG capsule Take 1 capsule by mouth daily.    Marland Kitchen FLUoxetine (PROZAC) 40 MG capsule Take by mouth.    . fluticasone (FLONASE) 50 MCG/ACT nasal spray     . HYDROcodone-acetaminophen (NORCO) 7.5-325 MG tablet Take 1 tablet by mouth every 4 (four) hours as needed for moderate pain. 120 tablet 0  . lidocaine-prilocaine (EMLA) cream Apply to affected area once 30 g 3  . LORazepam (ATIVAN) 0.5 MG tablet Take 1 tablet (0.5 mg total) by mouth every 6 (six) hours as needed for anxiety. 90 tablet 0  . losartan (COZAAR) 50 MG tablet daily.    . magic mouthwash (nystatin, lidocaine, diphenhydrAMINE) suspension Take 5 mLs by mouth every 3 (three) hours as needed for mouth pain. Swish & Spit; 256m bottle [facility] 1305 per Felicia Both P,NP    . magnesium oxide (MAG-OX) 400 (241.3 Mg) MG tablet Take 1 tablet (400 mg total) by mouth 2 (two) times daily. 60 tablet 1  . metFORMIN (GLUCOPHAGE) 500 MG tablet 2 (two) times daily.    . Multiple Vitamins-Minerals (THERA-M) TABS Take by mouth.    . ondansetron (ZOFRAN) 8 MG tablet Take 1 tablet (8 mg total) by mouth 2 (two) times daily as needed (Nausea or vomiting). Start on the third day after chemotherapy. 30 tablet 1  . prochlorperazine (COMPAZINE) 10 MG tablet Take 1 tablet (10 mg total) by mouth every 6 (six) hours as needed (Nausea or vomiting). 30 tablet 1  . rosuvastatin  (CRESTOR) 40 MG tablet daily.    . traMADol (ULTRAM) 50 MG tablet Take 1-2 tablets (50-100 mg total) by mouth every 6 (six) hours as needed. 60 tablet 0  . vitamin B-12 (CYANOCOBALAMIN) 500 MCG tablet Take 500 mcg by mouth daily.     No current facility-administered medications for this visit.     ASSESSMENT & PLAN:   Assessment/Plan:  1.  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 This is felt to most likely represent a recurrence rather than a 2nd breast primary. There really is no way to prove this and the slides from her prior cancer are no longer available. She is receiving neoadjuvant TCHP  and having much difficulty tolerating it. We will plan on fluids on Day 3 with cycle 5 to see if we can prevent dehydration.   2. Diarrhea, she was recently constipated, so took laxatives. This has resolved since last visit.   3. Neuropathy, which is stable.  4. Taste changes, decreased appetite and weight loss.  She has lost another 7 lb. Her husband asks about something for appetite.  The patient states that when she takes her steroids for chemotherapy she does not feel these help her appetite.  She is willing  to try dronabinol and I will start her on 5 mg at bedtime. Her appetite is improved and she has gained 5 pounds.  5. Intractable nausea and vomiting despite Zofran and Compazine. This has resolved since last visit. We will try more aggressive management with next cycle.   6. Clinical dehydration post treatment. We will give IVF Day 3 of this cycle and possibly more the following week.    We will try to be more aggressive upfront with this cycle by giving IVF on Day 3 with neulasta injection and again the following week as needed. She will take her anti-emetics around the clock with the addition  of ativan with this cycle. We will see her back in 3 weeks for scheduled follow up prior to cycle 6 TCHP with prn visits in between for symptom management.   She verbalizes understanding of and agreement to the plans discussed today. She knows to call the office should any new questions or concerns arise.    [name], NP

## 2020-09-21 ENCOUNTER — Inpatient Hospital Stay: Payer: Medicare Other

## 2020-09-21 ENCOUNTER — Encounter: Payer: Self-pay | Admitting: Hematology and Oncology

## 2020-09-21 ENCOUNTER — Other Ambulatory Visit: Payer: Self-pay

## 2020-09-21 ENCOUNTER — Inpatient Hospital Stay: Payer: Medicare Other | Attending: Oncology | Admitting: Hematology and Oncology

## 2020-09-21 VITALS — BP 118/65 | HR 87 | Temp 98.5°F | Resp 18 | Ht 62.0 in | Wt 127.1 lb

## 2020-09-21 DIAGNOSIS — C50911 Malignant neoplasm of unspecified site of right female breast: Secondary | ICD-10-CM

## 2020-09-21 DIAGNOSIS — C50212 Malignant neoplasm of upper-inner quadrant of left female breast: Secondary | ICD-10-CM | POA: Insufficient documentation

## 2020-09-21 DIAGNOSIS — C50012 Malignant neoplasm of nipple and areola, left female breast: Secondary | ICD-10-CM

## 2020-09-21 DIAGNOSIS — C50011 Malignant neoplasm of nipple and areola, right female breast: Secondary | ICD-10-CM

## 2020-09-21 DIAGNOSIS — Z7984 Long term (current) use of oral hypoglycemic drugs: Secondary | ICD-10-CM | POA: Insufficient documentation

## 2020-09-21 DIAGNOSIS — Z5111 Encounter for antineoplastic chemotherapy: Secondary | ICD-10-CM | POA: Insufficient documentation

## 2020-09-21 DIAGNOSIS — Z5112 Encounter for antineoplastic immunotherapy: Secondary | ICD-10-CM | POA: Insufficient documentation

## 2020-09-21 DIAGNOSIS — Z7982 Long term (current) use of aspirin: Secondary | ICD-10-CM | POA: Insufficient documentation

## 2020-09-21 DIAGNOSIS — Z79899 Other long term (current) drug therapy: Secondary | ICD-10-CM | POA: Insufficient documentation

## 2020-09-21 DIAGNOSIS — Z5189 Encounter for other specified aftercare: Secondary | ICD-10-CM | POA: Insufficient documentation

## 2020-09-21 DIAGNOSIS — Z170 Estrogen receptor positive status [ER+]: Secondary | ICD-10-CM

## 2020-09-21 DIAGNOSIS — E119 Type 2 diabetes mellitus without complications: Secondary | ICD-10-CM | POA: Insufficient documentation

## 2020-09-21 DIAGNOSIS — E860 Dehydration: Secondary | ICD-10-CM | POA: Insufficient documentation

## 2020-09-21 LAB — CBC AND DIFFERENTIAL
HCT: 29 — AB (ref 36–46)
Hemoglobin: 9.6 — AB (ref 12.0–16.0)
Neutrophils Absolute: 4.33
Platelets: 227 (ref 150–399)
WBC: 5.7

## 2020-09-21 LAB — HEPATIC FUNCTION PANEL
ALT: 23 (ref 7–35)
AST: 21 (ref 13–35)
Alkaline Phosphatase: 88 (ref 25–125)
Bilirubin, Total: 0.4

## 2020-09-21 LAB — CBC: RBC: 2.91 — AB (ref 3.87–5.11)

## 2020-09-21 LAB — BASIC METABOLIC PANEL
BUN: 8 (ref 4–21)
CO2: 31 — AB (ref 13–22)
Chloride: 104 (ref 99–108)
Creatinine: 0.4 — AB (ref 0.5–1.1)
Glucose: 154
Potassium: 3.3 — AB (ref 3.4–5.3)
Sodium: 139 (ref 137–147)

## 2020-09-21 LAB — COMPREHENSIVE METABOLIC PANEL
Albumin: 3.9 (ref 3.5–5.0)
Calcium: 9.1 (ref 8.7–10.7)

## 2020-09-25 ENCOUNTER — Encounter: Payer: Self-pay | Admitting: Hematology and Oncology

## 2020-09-26 ENCOUNTER — Other Ambulatory Visit: Payer: Self-pay

## 2020-09-26 ENCOUNTER — Inpatient Hospital Stay: Payer: Medicare Other

## 2020-09-26 VITALS — BP 143/66 | HR 78 | Temp 97.7°F | Resp 18 | Ht 62.0 in | Wt 129.0 lb

## 2020-09-26 DIAGNOSIS — Z5112 Encounter for antineoplastic immunotherapy: Secondary | ICD-10-CM | POA: Diagnosis not present

## 2020-09-26 DIAGNOSIS — Z79899 Other long term (current) drug therapy: Secondary | ICD-10-CM | POA: Diagnosis not present

## 2020-09-26 DIAGNOSIS — Z7984 Long term (current) use of oral hypoglycemic drugs: Secondary | ICD-10-CM | POA: Diagnosis not present

## 2020-09-26 DIAGNOSIS — Z7982 Long term (current) use of aspirin: Secondary | ICD-10-CM | POA: Diagnosis not present

## 2020-09-26 DIAGNOSIS — C50212 Malignant neoplasm of upper-inner quadrant of left female breast: Secondary | ICD-10-CM | POA: Diagnosis present

## 2020-09-26 DIAGNOSIS — E860 Dehydration: Secondary | ICD-10-CM | POA: Diagnosis not present

## 2020-09-26 DIAGNOSIS — Z5111 Encounter for antineoplastic chemotherapy: Secondary | ICD-10-CM | POA: Diagnosis present

## 2020-09-26 DIAGNOSIS — E119 Type 2 diabetes mellitus without complications: Secondary | ICD-10-CM | POA: Diagnosis not present

## 2020-09-26 DIAGNOSIS — C50912 Malignant neoplasm of unspecified site of left female breast: Secondary | ICD-10-CM

## 2020-09-26 DIAGNOSIS — Z5189 Encounter for other specified aftercare: Secondary | ICD-10-CM | POA: Diagnosis not present

## 2020-09-26 DIAGNOSIS — Z170 Estrogen receptor positive status [ER+]: Secondary | ICD-10-CM | POA: Diagnosis not present

## 2020-09-26 MED ORDER — SODIUM CHLORIDE 0.9 % IV SOLN
420.0000 mg | Freq: Once | INTRAVENOUS | Status: AC
  Administered 2020-09-26: 420 mg via INTRAVENOUS
  Filled 2020-09-26: qty 14

## 2020-09-26 MED ORDER — HEPARIN SOD (PORK) LOCK FLUSH 100 UNIT/ML IV SOLN
500.0000 [IU] | Freq: Once | INTRAVENOUS | Status: AC | PRN
  Administered 2020-09-26: 500 [IU]
  Filled 2020-09-26: qty 5

## 2020-09-26 MED ORDER — TRASTUZUMAB-ANNS CHEMO 150 MG IV SOLR
6.0000 mg/kg | Freq: Once | INTRAVENOUS | Status: AC
  Administered 2020-09-26: 357 mg via INTRAVENOUS
  Filled 2020-09-26: qty 17

## 2020-09-26 MED ORDER — PALONOSETRON HCL INJECTION 0.25 MG/5ML
0.2500 mg | Freq: Once | INTRAVENOUS | Status: AC
  Administered 2020-09-26: 0.25 mg via INTRAVENOUS

## 2020-09-26 MED ORDER — DIPHENHYDRAMINE HCL 50 MG/ML IJ SOLN
INTRAMUSCULAR | Status: AC
  Filled 2020-09-26: qty 1

## 2020-09-26 MED ORDER — DIPHENHYDRAMINE HCL 50 MG/ML IJ SOLN
25.0000 mg | Freq: Once | INTRAMUSCULAR | Status: AC
  Administered 2020-09-26: 25 mg via INTRAVENOUS

## 2020-09-26 MED ORDER — SODIUM CHLORIDE 0.9 % IV SOLN
56.2500 mg/m2 | Freq: Once | INTRAVENOUS | Status: AC
  Administered 2020-09-26: 90 mg via INTRAVENOUS
  Filled 2020-09-26: qty 9

## 2020-09-26 MED ORDER — DEXAMETHASONE SODIUM PHOSPHATE 10 MG/ML IJ SOLN
INTRAMUSCULAR | Status: AC
  Filled 2020-09-26: qty 1

## 2020-09-26 MED ORDER — SODIUM CHLORIDE 0.9 % IV SOLN
150.0000 mg | Freq: Once | INTRAVENOUS | Status: AC
  Administered 2020-09-26: 150 mg via INTRAVENOUS
  Filled 2020-09-26: qty 150

## 2020-09-26 MED ORDER — SODIUM CHLORIDE 0.9 % IV SOLN
Freq: Once | INTRAVENOUS | Status: AC
  Filled 2020-09-26: qty 250

## 2020-09-26 MED ORDER — ACETAMINOPHEN 325 MG PO TABS
ORAL_TABLET | ORAL | Status: AC
  Filled 2020-09-26: qty 2

## 2020-09-26 MED ORDER — SODIUM CHLORIDE 0.9 % IV SOLN
461.4000 mg | Freq: Once | INTRAVENOUS | Status: AC
  Administered 2020-09-26: 460 mg via INTRAVENOUS
  Filled 2020-09-26: qty 46

## 2020-09-26 MED ORDER — ACETAMINOPHEN 325 MG PO TABS
650.0000 mg | ORAL_TABLET | Freq: Once | ORAL | Status: AC
  Administered 2020-09-26: 650 mg via ORAL

## 2020-09-26 MED ORDER — PALONOSETRON HCL INJECTION 0.25 MG/5ML
INTRAVENOUS | Status: AC
  Filled 2020-09-26: qty 5

## 2020-09-26 MED ORDER — SODIUM CHLORIDE 0.9% FLUSH
10.0000 mL | INTRAVENOUS | Status: DC | PRN
  Administered 2020-09-26: 10 mL
  Filled 2020-09-26: qty 10

## 2020-09-26 MED ORDER — DEXAMETHASONE SODIUM PHOSPHATE 10 MG/ML IJ SOLN
8.0000 mg | Freq: Once | INTRAMUSCULAR | Status: AC
  Administered 2020-09-26: 8 mg via INTRAVENOUS

## 2020-09-26 NOTE — Patient Instructions (Signed)
Pertuzumab injection What is this medicine? PERTUZUMAB (per TOOZ ue mab) is a monoclonal antibody. It is used to treat breast cancer. This medicine may be used for other purposes; ask your health care provider or pharmacist if you have questions. COMMON BRAND NAME(S): PERJETA What should I tell my health care provider before I take this medicine? They need to know if you have any of these conditions:  heart disease  heart failure  high blood pressure  history of irregular heart beat  recent or ongoing radiation therapy  an unusual or allergic reaction to pertuzumab, other medicines, foods, dyes, or preservatives  pregnant or trying to get pregnant  breast-feeding How should I use this medicine? This medicine is for infusion into a vein. It is given by a health care professional in a hospital or clinic setting. Talk to your pediatrician regarding the use of this medicine in children. Special care may be needed. Overdosage: If you think you have taken too much of this medicine contact a poison control center or emergency room at once. NOTE: This medicine is only for you. Do not share this medicine with others. What if I miss a dose? It is important not to miss your dose. Call your doctor or health care professional if you are unable to keep an appointment. What may interact with this medicine? Interactions are not expected. Give your health care provider a list of all the medicines, herbs, non-prescription drugs, or dietary supplements you use. Also tell them if you smoke, drink alcohol, or use illegal drugs. Some items may interact with your medicine. This list may not describe all possible interactions. Give your health care provider a list of all the medicines, herbs, non-prescription drugs, or dietary supplements you use. Also tell them if you smoke, drink alcohol, or use illegal drugs. Some items may interact with your medicine. What should I watch for while using this  medicine? Your condition will be monitored carefully while you are receiving this medicine. Report any side effects. Continue your course of treatment even though you feel ill unless your doctor tells you to stop. Do not become pregnant while taking this medicine or for 7 months after stopping it. Women should inform their doctor if they wish to become pregnant or think they might be pregnant. Women of child-bearing potential will need to have a negative pregnancy test before starting this medicine. There is a potential for serious side effects to an unborn child. Talk to your health care professional or pharmacist for more information. Do not breast-feed an infant while taking this medicine or for 7 months after stopping it. Women must use effective birth control with this medicine. Call your doctor or health care professional for advice if you get a fever, chills or sore throat, or other symptoms of a cold or flu. Do not treat yourself. Try to avoid being around people who are sick. You may experience fever, chills, and headache during the infusion. Report any side effects during the infusion to your health care professional. What side effects may I notice from receiving this medicine? Side effects that you should report to your doctor or health care professional as soon as possible:  breathing problems  chest pain or palpitations  dizziness  feeling faint or lightheaded  fever or chills  skin rash, itching or hives  sore throat  swelling of the face, lips, or tongue  swelling of the legs or ankles  unusually weak or tired Side effects that usually do not require  medical attention (report to your doctor or health care professional if they continue or are bothersome):  diarrhea  hair loss  nausea, vomiting  tiredness This list may not describe all possible side effects. Call your doctor for medical advice about side effects. You may report side effects to FDA at  1-800-FDA-1088. Where should I keep my medicine? This drug is given in a hospital or clinic and will not be stored at home. NOTE: This sheet is a summary. It may not cover all possible information. If you have questions about this medicine, talk to your doctor, pharmacist, or health care provider.  2021 Elsevier/Gold Standard (2015-06-07 12:08:50) Trastuzumab injection for infusion What is this medicine? TRASTUZUMAB (tras TOO zoo mab) is a monoclonal antibody. It is used to treat breast cancer and stomach cancer. This medicine may be used for other purposes; ask your health care provider or pharmacist if you have questions. COMMON BRAND NAME(S): Herceptin, Galvin Proffer, Trazimera What should I tell my health care provider before I take this medicine? They need to know if you have any of these conditions:  heart disease  heart failure  lung or breathing disease, like asthma  an unusual or allergic reaction to trastuzumab, benzyl alcohol, or other medications, foods, dyes, or preservatives  pregnant or trying to get pregnant  breast-feeding How should I use this medicine? This drug is given as an infusion into a vein. It is administered in a hospital or clinic by a specially trained health care professional. Talk to your pediatrician regarding the use of this medicine in children. This medicine is not approved for use in children. Overdosage: If you think you have taken too much of this medicine contact a poison control center or emergency room at once. NOTE: This medicine is only for you. Do not share this medicine with others. What if I miss a dose? It is important not to miss a dose. Call your doctor or health care professional if you are unable to keep an appointment. What may interact with this medicine? This medicine may interact with the following medications:  certain types of chemotherapy, such as daunorubicin, doxorubicin, epirubicin, and  idarubicin This list may not describe all possible interactions. Give your health care provider a list of all the medicines, herbs, non-prescription drugs, or dietary supplements you use. Also tell them if you smoke, drink alcohol, or use illegal drugs. Some items may interact with your medicine. What should I watch for while using this medicine? Visit your doctor for checks on your progress. Report any side effects. Continue your course of treatment even though you feel ill unless your doctor tells you to stop. Call your doctor or health care professional for advice if you get a fever, chills or sore throat, or other symptoms of a cold or flu. Do not treat yourself. Try to avoid being around people who are sick. You may experience fever, chills and shaking during your first infusion. These effects are usually mild and can be treated with other medicines. Report any side effects during the infusion to your health care professional. Fever and chills usually do not happen with later infusions. Do not become pregnant while taking this medicine or for 7 months after stopping it. Women should inform their doctor if they wish to become pregnant or think they might be pregnant. Women of child-bearing potential will need to have a negative pregnancy test before starting this medicine. There is a potential for serious side effects to an unborn  child. Talk to your health care professional or pharmacist for more information. Do not breast-feed an infant while taking this medicine or for 7 months after stopping it. Women must use effective birth control with this medicine. What side effects may I notice from receiving this medicine? Side effects that you should report to your doctor or health care professional as soon as possible:  allergic reactions like skin rash, itching or hives, swelling of the face, lips, or tongue  chest pain or palpitations  cough  dizziness  feeling faint or lightheaded,  falls  fever  general ill feeling or flu-like symptoms  signs of worsening heart failure like breathing problems; swelling in your legs and feet  unusually weak or tired Side effects that usually do not require medical attention (report to your doctor or health care professional if they continue or are bothersome):  bone pain  changes in taste  diarrhea  joint pain  nausea/vomiting  weight loss This list may not describe all possible side effects. Call your doctor for medical advice about side effects. You may report side effects to FDA at 1-800-FDA-1088. Where should I keep my medicine? This drug is given in a hospital or clinic and will not be stored at home. NOTE: This sheet is a summary. It may not cover all possible information. If you have questions about this medicine, talk to your doctor, pharmacist, or health care provider.  2021 Elsevier/Gold Standard (2016-04-29 14:37:52) Docetaxel injection What is this medicine? DOCETAXEL (doe se TAX el) is a chemotherapy drug. It targets fast dividing cells, like cancer cells, and causes these cells to die. This medicine is used to treat many types of cancers like breast cancer, certain stomach cancers, head and neck cancer, lung cancer, and prostate cancer. This medicine may be used for other purposes; ask your health care provider or pharmacist if you have questions. COMMON BRAND NAME(S): Docefrez, Taxotere What should I tell my health care provider before I take this medicine? They need to know if you have any of these conditions:  infection (especially a virus infection such as chickenpox, cold sores, or herpes)  liver disease  low blood counts, like low white cell, platelet, or red cell counts  an unusual or allergic reaction to docetaxel, polysorbate 80, other chemotherapy agents, other medicines, foods, dyes, or preservatives  pregnant or trying to get pregnant  breast-feeding How should I use this medicine? This  drug is given as an infusion into a vein. It is administered in a hospital or clinic by a specially trained health care professional. Talk to your pediatrician regarding the use of this medicine in children. Special care may be needed. Overdosage: If you think you have taken too much of this medicine contact a poison control center or emergency room at once. NOTE: This medicine is only for you. Do not share this medicine with others. What if I miss a dose? It is important not to miss your dose. Call your doctor or health care professional if you are unable to keep an appointment. What may interact with this medicine? Do not take this medicine with any of the following medications:  live virus vaccines This medicine may also interact with the following medications:  aprepitant  certain antibiotics like erythromycin or clarithromycin  certain antivirals for HIV or hepatitis  certain medicines for fungal infections like fluconazole, itraconazole, ketoconazole, posaconazole, or voriconazole  cimetidine  ciprofloxacin  conivaptan  cyclosporine  dronedarone  fluvoxamine  grapefruit juice  imatinib  verapamil  This list may not describe all possible interactions. Give your health care provider a list of all the medicines, herbs, non-prescription drugs, or dietary supplements you use. Also tell them if you smoke, drink alcohol, or use illegal drugs. Some items may interact with your medicine. What should I watch for while using this medicine? Your condition will be monitored carefully while you are receiving this medicine. You will need important blood work done while you are taking this medicine. Call your doctor or health care professional for advice if you get a fever, chills or sore throat, or other symptoms of a cold or flu. Do not treat yourself. This drug decreases your body's ability to fight infections. Try to avoid being around people who are sick. Some products may contain  alcohol. Ask your health care professional if this medicine contains alcohol. Be sure to tell all health care professionals you are taking this medicine. Certain medicines, like metronidazole and disulfiram, can cause an unpleasant reaction when taken with alcohol. The reaction includes flushing, headache, nausea, vomiting, sweating, and increased thirst. The reaction can last from 30 minutes to several hours. You may get drowsy or dizzy. Do not drive, use machinery, or do anything that needs mental alertness until you know how this medicine affects you. Do not stand or sit up quickly, especially if you are an older patient. This reduces the risk of dizzy or fainting spells. Alcohol may interfere with the effect of this medicine. Talk to your health care professional about your risk of cancer. You may be more at risk for certain types of cancer if you take this medicine. Do not become pregnant while taking this medicine or for 6 months after stopping it. Women should inform their doctor if they wish to become pregnant or think they might be pregnant. There is a potential for serious side effects to an unborn child. Talk to your health care professional or pharmacist for more information. Do not breast-feed an infant while taking this medicine or for 1 week after stopping it. Males who get this medicine must use a condom during sex with females who can get pregnant. If you get a woman pregnant, the baby could have birth defects. The baby could die before they are born. You will need to continue wearing a condom for 3 months after stopping the medicine. Tell your health care provider right away if your partner becomes pregnant while you are taking this medicine. This may interfere with the ability to father a child. You should talk to your doctor or health care professional if you are concerned about your fertility. What side effects may I notice from receiving this medicine? Side effects that you should report  to your doctor or health care professional as soon as possible:  allergic reactions like skin rash, itching or hives, swelling of the face, lips, or tongue  blurred vision  breathing problems  changes in vision  low blood counts - This drug may decrease the number of white blood cells, red blood cells and platelets. You may be at increased risk for infections and bleeding.  nausea and vomiting  pain, redness or irritation at site where injected  pain, tingling, numbness in the hands or feet  redness, blistering, peeling, or loosening of the skin, including inside the mouth  signs of decreased platelets or bleeding - bruising, pinpoint red spots on the skin, black, tarry stools, nosebleeds  signs of decreased red blood cells - unusually weak or tired, fainting spells, lightheadedness  signs of infection - fever or chills, cough, sore throat, pain or difficulty passing urine  swelling of the ankle, feet, hands Side effects that usually do not require medical attention (report to your doctor or health care professional if they continue or are bothersome):  constipation  diarrhea  fingernail or toenail changes  hair loss  loss of appetite  mouth sores  muscle pain This list may not describe all possible side effects. Call your doctor for medical advice about side effects. You may report side effects to FDA at 1-800-FDA-1088. Where should I keep my medicine? This drug is given in a hospital or clinic and will not be stored at home. NOTE: This sheet is a summary. It may not cover all possible information. If you have questions about this medicine, talk to your doctor, pharmacist, or health care provider.  2021 Elsevier/Gold Standard (2019-04-04 19:50:31) Carboplatin injection What is this medicine? CARBOPLATIN (KAR boe pla tin) is a chemotherapy drug. It targets fast dividing cells, like cancer cells, and causes these cells to die. This medicine is used to treat ovarian  cancer and many other cancers. This medicine may be used for other purposes; ask your health care provider or pharmacist if you have questions. COMMON BRAND NAME(S): Paraplatin What should I tell my health care provider before I take this medicine? They need to know if you have any of these conditions:  blood disorders  hearing problems  kidney disease  recent or ongoing radiation therapy  an unusual or allergic reaction to carboplatin, cisplatin, other chemotherapy, other medicines, foods, dyes, or preservatives  pregnant or trying to get pregnant  breast-feeding How should I use this medicine? This drug is usually given as an infusion into a vein. It is administered in a hospital or clinic by a specially trained health care professional. Talk to your pediatrician regarding the use of this medicine in children. Special care may be needed. Overdosage: If you think you have taken too much of this medicine contact a poison control center or emergency room at once. NOTE: This medicine is only for you. Do not share this medicine with others. What if I miss a dose? It is important not to miss a dose. Call your doctor or health care professional if you are unable to keep an appointment. What may interact with this medicine?  medicines for seizures  medicines to increase blood counts like filgrastim, pegfilgrastim, sargramostim  some antibiotics like amikacin, gentamicin, neomycin, streptomycin, tobramycin  vaccines Talk to your doctor or health care professional before taking any of these medicines:  acetaminophen  aspirin  ibuprofen  ketoprofen  naproxen This list may not describe all possible interactions. Give your health care provider a list of all the medicines, herbs, non-prescription drugs, or dietary supplements you use. Also tell them if you smoke, drink alcohol, or use illegal drugs. Some items may interact with your medicine. What should I watch for while using  this medicine? Your condition will be monitored carefully while you are receiving this medicine. You will need important blood work done while you are taking this medicine. This drug may make you feel generally unwell. This is not uncommon, as chemotherapy can affect healthy cells as well as cancer cells. Report any side effects. Continue your course of treatment even though you feel ill unless your doctor tells you to stop. In some cases, you may be given additional medicines to help with side effects. Follow all directions for their use. Call your doctor or health  care professional for advice if you get a fever, chills or sore throat, or other symptoms of a cold or flu. Do not treat yourself. This drug decreases your body's ability to fight infections. Try to avoid being around people who are sick. This medicine may increase your risk to bruise or bleed. Call your doctor or health care professional if you notice any unusual bleeding. Be careful brushing and flossing your teeth or using a toothpick because you may get an infection or bleed more easily. If you have any dental work done, tell your dentist you are receiving this medicine. Avoid taking products that contain aspirin, acetaminophen, ibuprofen, naproxen, or ketoprofen unless instructed by your doctor. These medicines may hide a fever. Do not become pregnant while taking this medicine. Women should inform their doctor if they wish to become pregnant or think they might be pregnant. There is a potential for serious side effects to an unborn child. Talk to your health care professional or pharmacist for more information. Do not breast-feed an infant while taking this medicine. What side effects may I notice from receiving this medicine? Side effects that you should report to your doctor or health care professional as soon as possible:  allergic reactions like skin rash, itching or hives, swelling of the face, lips, or tongue  signs of infection  - fever or chills, cough, sore throat, pain or difficulty passing urine  signs of decreased platelets or bleeding - bruising, pinpoint red spots on the skin, black, tarry stools, nosebleeds  signs of decreased red blood cells - unusually weak or tired, fainting spells, lightheadedness  breathing problems  changes in hearing  changes in vision  chest pain  high blood pressure  low blood counts - This drug may decrease the number of white blood cells, red blood cells and platelets. You may be at increased risk for infections and bleeding.  nausea and vomiting  pain, swelling, redness or irritation at the injection site  pain, tingling, numbness in the hands or feet  problems with balance, talking, walking  trouble passing urine or change in the amount of urine Side effects that usually do not require medical attention (report to your doctor or health care professional if they continue or are bothersome):  hair loss  loss of appetite  metallic taste in the mouth or changes in taste This list may not describe all possible side effects. Call your doctor for medical advice about side effects. You may report side effects to FDA at 1-800-FDA-1088. Where should I keep my medicine? This drug is given in a hospital or clinic and will not be stored at home. NOTE: This sheet is a summary. It may not cover all possible information. If you have questions about this medicine, talk to your doctor, pharmacist, or health care provider.  2021 Elsevier/Gold Standard (2007-08-10 14:38:05)

## 2020-09-28 ENCOUNTER — Other Ambulatory Visit: Payer: Self-pay | Admitting: Hematology and Oncology

## 2020-09-28 ENCOUNTER — Inpatient Hospital Stay: Payer: Medicare Other

## 2020-09-28 ENCOUNTER — Other Ambulatory Visit: Payer: Self-pay

## 2020-09-28 VITALS — BP 132/74 | HR 88 | Temp 98.1°F | Resp 18 | Ht 62.0 in | Wt 129.0 lb

## 2020-09-28 DIAGNOSIS — Z5112 Encounter for antineoplastic immunotherapy: Secondary | ICD-10-CM | POA: Diagnosis not present

## 2020-09-28 DIAGNOSIS — E860 Dehydration: Secondary | ICD-10-CM

## 2020-09-28 DIAGNOSIS — C50912 Malignant neoplasm of unspecified site of left female breast: Secondary | ICD-10-CM

## 2020-09-28 MED ORDER — PEGFILGRASTIM-BMEZ 6 MG/0.6ML ~~LOC~~ SOSY
6.0000 mg | PREFILLED_SYRINGE | Freq: Once | SUBCUTANEOUS | Status: AC
  Administered 2020-09-28: 6 mg via SUBCUTANEOUS

## 2020-09-28 MED ORDER — PEGFILGRASTIM-BMEZ 6 MG/0.6ML ~~LOC~~ SOSY
PREFILLED_SYRINGE | SUBCUTANEOUS | Status: AC
  Filled 2020-09-28: qty 0.6

## 2020-09-28 MED ORDER — SODIUM CHLORIDE 0.9 % IV SOLN
Freq: Once | INTRAVENOUS | Status: AC
  Filled 2020-09-28: qty 250

## 2020-09-28 NOTE — Patient Instructions (Signed)
Dehydration, Adult Dehydration is condition in which there is not enough water or other fluids in the body. This happens when a person loses more fluids than he or she takes in. Important body parts cannot work right without the right amount of fluids. Any loss of fluids from the body can cause dehydration. Dehydration can be mild, worse, or very bad. It should be treated right away to keep it from getting very bad. What are the causes? This condition may be caused by:  Conditions that cause loss of water or other fluids, such as: ? Watery poop (diarrhea). ? Vomiting. ? Sweating a lot. ? Peeing (urinating) a lot.  Not drinking enough fluids, especially when you: ? Are ill. ? Are doing things that take a lot of energy to do.  Other illnesses and conditions, such as fever or infection.  Certain medicines, such as medicines that take extra fluid out of the body (diuretics).  Lack of safe drinking water.  Not being able to get enough water and food. What increases the risk? The following factors may make you more likely to develop this condition:  Having a long-term (chronic) illness that has not been treated the right way, such as: ? Diabetes. ? Heart disease. ? Kidney disease.  Being 65 years of age or older.  Having a disability.  Living in a place that is high above the ground or sea (high in altitude). The thinner, dried air causes more fluid loss.  Doing exercises that put stress on your body for a long time. What are the signs or symptoms? Symptoms of dehydration depend on how bad it is. Mild or worse dehydration  Thirst.  Dry lips or dry mouth.  Feeling dizzy or light-headed, especially when you stand up from sitting.  Muscle cramps.  Your body making: ? Dark pee (urine). Pee may be the color of tea. ? Less pee than normal. ? Less tears than normal.  Headache. Very bad dehydration  Changes in skin. Skin may: ? Be cold to the touch (clammy). ? Be blotchy  or pale. ? Not go back to normal right after you lightly pinch it and let it go.  Little or no tears, pee, or sweat.  Changes in vital signs, such as: ? Fast breathing. ? Low blood pressure. ? Weak pulse. ? Pulse that is more than 100 beats a minute when you are sitting still.  Other changes, such as: ? Feeling very thirsty. ? Eyes that look hollow (sunken). ? Cold hands and feet. ? Being mixed up (confused). ? Being very tired (lethargic) or having trouble waking from sleep. ? Short-term weight loss. ? Loss of consciousness. How is this treated? Treatment for this condition depends on how bad it is. Treatment should start right away. Do not wait until your condition gets very bad. Very bad dehydration is an emergency. You will need to go to a hospital.  Mild or worse dehydration can be treated at home. You may be asked to: ? Drink more fluids. ? Drink an oral rehydration solution (ORS). This drink helps get the right amounts of fluids and salts and minerals in the blood (electrolytes).  Very bad dehydration can be treated: ? With fluids through an IV tube. ? By getting normal levels of salts and minerals in your blood. This is often done by giving salts and minerals through a tube. The tube is passed through your nose and into your stomach. ? By treating the root cause. Follow these instructions at   home: Oral rehydration solution If told by your doctor, drink an ORS:  Make an ORS. Use instructions on the package.  Start by drinking small amounts, about  cup (120 mL) every 5-10 minutes.  Slowly drink more until you have had the amount that your doctor said to have. Eating and drinking  Drink enough clear fluid to keep your pee pale yellow. If you were told to drink an ORS, finish the ORS first. Then, start slowly drinking other clear fluids. Drink fluids such as: ? Water. Do not drink only water. Doing that can make the salt (sodium) level in your body get too low. ? Water  from ice chips you suck on. ? Fruit juice that you have added water to (diluted). ? Low-calorie sports drinks.  Eat foods that have the right amounts of salts and minerals, such as: ? Bananas. ? Oranges. ? Potatoes. ? Tomatoes. ? Spinach.  Do not drink alcohol.  Avoid: ? Drinks that have a lot of sugar. These include:  High-calorie sports drinks.  Fruit juice that you did not add water to.  Soda.  Caffeine. ? Foods that are greasy or have a lot of fat or sugar.         General instructions  Take over-the-counter and prescription medicines only as told by your doctor.  Do not take salt tablets. Doing that can make the salt level in your body get too high.  Return to your normal activities as told by your doctor. Ask your doctor what activities are safe for you.  Keep all follow-up visits as told by your doctor. This is important. Contact a doctor if:  You have pain in your belly (abdomen) and the pain: ? Gets worse. ? Stays in one place.  You have a rash.  You have a stiff neck.  You get angry or annoyed (irritable) more easily than normal.  You are more tired or have a harder time waking than normal.  You feel: ? Weak or dizzy. ? Very thirsty. Get help right away if you have:  Any symptoms of very bad dehydration.  Symptoms of vomiting, such as: ? You cannot eat or drink without vomiting. ? Your vomiting gets worse or does not go away. ? Your vomit has blood or green stuff in it.  Symptoms that get worse with treatment.  A fever.  A very bad headache.  Problems with peeing or pooping (having a bowel movement), such as: ? Watery poop that gets worse or does not go away. ? Blood in your poop (stool). This may cause poop to look black and tarry. ? Not peeing in 6-8 hours. ? Peeing only a small amount of very dark pee in 6-8 hours.  Trouble breathing. These symptoms may be an emergency. Do not wait to see if the symptoms will go away. Get  medical help right away. Call your local emergency services (911 in the [place] Do not drive yourself to the hospital. Summary  Dehydration is a condition in which there is not enough water or other fluids in the body. This happens when a person loses more fluids than he or she takes in.  Treatment for this condition depends on how bad it is. Treatment should be started right away. Do not wait until your condition gets very bad.  Drink enough clear fluid to keep your pee pale yellow. If you were told to drink an oral rehydration solution (ORS), finish the ORS first. Then, start slowly drinking other clear fluids.    Take over-the-counter and prescription medicines only as told by your doctor.  Get help right away if you have any symptoms of very bad dehydration. This information is not intended to replace advice given to you by your health care provider. Make sure you discuss any questions you have with your health care provider. Document Revised: 12/16/2018 Document Reviewed: 12/16/2018 [employer] Patient Education  2021 [employer]  

## 2020-10-02 ENCOUNTER — Telehealth: Payer: Self-pay

## 2020-10-02 ENCOUNTER — Encounter: Payer: Self-pay | Admitting: Oncology

## 2020-10-02 NOTE — Telephone Encounter (Addendum)
Pt notified to be here tomorrow @ 930a labs, then to see [facility]. ----- Message from Marvia Pickles, PA-C sent at 10/02/2020  2:53 PM EDT ----- Regarding: RE: Pt weak, uncontrolled diarrhea, thinks she needs IVFs again Contact: [phone] It's 3pm, we cannot give her fluids today. I agree with your advice. Is she taking all the nausea meds also? Melissa told her to take them ATC prior to her last visit. We can see her with labs tomorrow. ----- Message ----- From: Dairl Ponder, RN Sent: 10/02/2020   2:36 PM EDT To: Marvia Pickles, PA-C Subject: Pt weak, uncontrolled dairrhea, thinks she n#  Pt called to report she isn't feeling well at all. 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 No emesis, but very frequent diarrhea. Pt states, "to the point I had a melt down yesterday". I asked Cadey how often she was taking the Imodium or Lomotil? She replied, "I've taken it twice today". I re-educated her that she should be taking up to 8 tabs/day to control the diarrhea. I stressed that losing electrolytes through the diarrhea can make her weak, tired and have headaches (for which she has all). Pt instructed to drink a cup full of something (Gatorade, Powerade, Pedialyte, etc) every time she has loose BM to prevent dehydration. Poor po intake continues due to taste changes. Her tongue is much better, is using the MMW. Pt is dizzy intermittently when standing. No falls. Afebrile. Please advise.

## 2020-10-03 ENCOUNTER — Inpatient Hospital Stay: Payer: Medicare Other

## 2020-10-03 ENCOUNTER — Encounter: Payer: Self-pay | Admitting: Hematology and Oncology

## 2020-10-03 ENCOUNTER — Other Ambulatory Visit: Payer: Self-pay

## 2020-10-03 ENCOUNTER — Other Ambulatory Visit: Payer: Self-pay | Admitting: Pharmacist

## 2020-10-03 ENCOUNTER — Inpatient Hospital Stay (INDEPENDENT_AMBULATORY_CARE_PROVIDER_SITE_OTHER): Payer: Medicare Other | Admitting: Hematology and Oncology

## 2020-10-03 VITALS — BP 114/57 | HR 95 | Temp 98.1°F | Resp 18 | Ht 62.0 in | Wt 120.1 lb

## 2020-10-03 DIAGNOSIS — C50911 Malignant neoplasm of unspecified site of right female breast: Secondary | ICD-10-CM

## 2020-10-03 DIAGNOSIS — Z5112 Encounter for antineoplastic immunotherapy: Secondary | ICD-10-CM | POA: Diagnosis not present

## 2020-10-03 DIAGNOSIS — E860 Dehydration: Secondary | ICD-10-CM

## 2020-10-03 LAB — CBC AND DIFFERENTIAL
HCT: 33 — AB (ref 36–46)
Hemoglobin: 10.6 — AB (ref 12.0–16.0)
Neutrophils Absolute: 5.54
Platelets: 258 (ref 150–399)
WBC: 7.8

## 2020-10-03 LAB — BASIC METABOLIC PANEL
BUN: 13 (ref 4–21)
CO2: 27 — AB (ref 13–22)
Chloride: 101 (ref 99–108)
Creatinine: 0.5 (ref 0.5–1.1)
Glucose: 172
Potassium: 4.2 (ref 3.4–5.3)
Sodium: 135 — AB (ref 137–147)

## 2020-10-03 LAB — HEPATIC FUNCTION PANEL
ALT: 27 (ref 7–35)
AST: 27 (ref 13–35)
Alkaline Phosphatase: 127 — AB (ref 25–125)
Bilirubin, Total: 0.9

## 2020-10-03 LAB — COMPREHENSIVE METABOLIC PANEL
Albumin: 4.6 (ref 3.5–5.0)
Calcium: 9.8 (ref 8.7–10.7)

## 2020-10-03 LAB — CBC: RBC: 3.21 — AB (ref 3.87–5.11)

## 2020-10-03 MED ORDER — SODIUM CHLORIDE 0.9 % IV SOLN
Freq: Once | INTRAVENOUS | Status: AC
  Filled 2020-10-03: qty 250

## 2020-10-03 MED ORDER — HEPARIN SOD (PORK) LOCK FLUSH 100 UNIT/ML IV SOLN
500.0000 [IU] | Freq: Once | INTRAVENOUS | Status: AC | PRN
  Administered 2020-10-03: 500 [IU]
  Filled 2020-10-03: qty 5

## 2020-10-03 NOTE — Progress Notes (Signed)
Patient is teary in clinic today,  She has lost 5.3 # and states this is from vomiting / diarrhea.  States she can't seem to get it together.  Increased weakness / fatigue.

## 2020-10-03 NOTE — Progress Notes (Signed)
[facility]  [address] [place],  [place]  [address] [phone]  Clinic Day:  10/03/2020  Referring physician: Delphina Cahill, FNP   CHIEF COMPLAINT:  CC:  Hormone and HER2/neu receptor positive breast cancer  Current Treatment:   Neoadjuvant docetaxel/carboplatin/trastuzumab/pertuzumab (TCHP)  HISTORY OF PRESENT ILLNESS:  Tammy Fowler is a 70 y.o. 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 Excisional biopsy revealed a 2 cm, grade 2, infiltrating ductal carcinoma in the left lower quadrant. Estrogen and progesterone receptors were positive and her 2 Neu negative. Tumor was present at the margins, so she underwent re-excision and axillary dissection. 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 Four of sixteen lymph nodes were positive for metastasis. All surgical margins were free of tumor involvement. She received adjuvant chemotherapy with with dose dense Adriamycin and Cytoxan for 4 cycles, followed by dose dense Taxol for 4 cycles, follwed by adjuvant radiation of the left breast. She was placed on anastrozole 1 mg daily in April 2005 and completed 10 years of therapy in April 2015. She underwent left latissimus dorsi muscle reconstruction of the left breast and right breast reduction. She underwent total abdominal hysterectomy and bilateral salpingo-oophorectomy prior to starting anastrozole. She had excision of a benign mass from the left breast in 2009. She underwent testing for BRCA mutations, including BART, in April 2013, which was negative, however, we did not have the results. She has a history of osteoporosis, previously treated with zolendronic acid every 6 months, which was discontinued when she completed anastrozole.   We began seeing her in July 2014, when she relocated to this area. Bone density  scan in December 2015 was normal. Her personal and family history wass suggestive of a hereditary cancer syndrome, so we recommended additional genetic testing with the Myriad myRisk Update Panel test to look for a mutation in other genes known to increase the risk for breast cancer and as well as other cancers. 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 These results were reviewed with her in detail and she was scheduled for an breast MRI which did not reveal any evidence of malignancy. She was unsure about undergoing prophylactic bilateral mastectomies. She was seen by Dr. Coralie Keens who referred her to Dr. Henrietta Hoover regarding reconstructive surgery. She was interested in reconstruction and the plastic surgeon that she saw discouraged her because of the increased risk of complications with surgery on previously irradiated tissue. The patient so far has continued close surveillance with annual mammogram and breast MRI spaced out 6 months. Bone density in January 2019was normal. Breast MRI in August 2019 did not reveal any evidence of malignancyShe developed diabetes and had an MRI abdomen, which did not reveal any evidence of pancreatic cancer. She had colonoscopy in early 2020by Dr. Marcell Anger in [place] and had 2 polyps removed. Repeat colonoscopy in 5 years was recommended. Bilateral screening mammogram in March 2020 and April 2021 did not reveal any evidence of malignancy.  She was seen in January 2022 with a new palpable mass in the left breast. Diagnostic left mammogram revealed a suspicious mass measuring 2.0 cm at 10 o'clock. Diagnostic right mammogram did not reveal any evidence of malignancy.  Ultrasound guided biopsy revealed poorly differentiated carcinoma in a background of adipose tissue with fibrosis and  necrosis, consistent with breast origin. Estrogen receptor was 30% positive and progesterone receptor negative and HER2 receptor positive. Ki67 was 25%. However, there was no breast tissue in the sample, so this was felt to be more likely a recurrence rather than a new breast primary, even though it has been many years. Her previous cancer was HER2 negative and ER positive.   We recommended neoadjuvant chemotherapy/HER2 targeted therapy with docetaxel/carboplatin/trastuzumab/pertuzumab [facility]) which she started treatment with [facility] on February 15th. She has experienced decreased appetite, taste changes, mouth sores, and diarrhea despite imodium. She was given a prescription for Lomotil to better control her diarrhea. MBX has been effective for her mouth sores She also had heartburn for which omeprazole has been effective. She received a 3rd cycle of TCHP on March 30th. At that time she continued to report diarrhea, but was not alternating Lomotil and Imodium. She has continued to lose weight, as she has not been eating well due to taste changes and decreased appetite.  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 After her 4th cycle of TCHP she presented to clinic with uncontrolled nausea and vomiting, as well dizziness and blurry vision. MRI brain did not reveal any intracranial metastasis.  She was given IV fluids and placed on lorazepam 0.5 mg every 6 hours as needed for nausea, vomiting and anxiety.  She was also placed on dronabinol 5 mg at bedtime for nausea, vomiting and appetite.  At her visit on May 6th, her side effects were fairly well controlled and she had gained 5 lb  She proceeded with a 5th cycle of TCHP at 25% dose reduction on May 11th..  INTERVAL HISTORY:  [name] is added to the schedule today as she telephoned yesterday reporting feeling poorly. She stated hasn't been able to keep anything on  her stomach since her last treatment , but she really does not try to eat and drink because of taste changes and decreased appetite. She denied vomiting, but reported frequent diarrhea. She reported dizziness, headache and fatigue.  She reported difficulty with her emotions. She is emotional today.  She has good support at home. She reports an episode of vomiting last night, but she did not take any medication and denies nausea today.  She states she basically has diarrhea daily, but once she takes Imodium and/or Lomotil the bowel movements are controlled until the following day.  She remains fatigued. She states she feels "loopy"  She is only taking lorazepam at bedtime. She continues duloxetine 60 mg daily and states she sometimes double this.  I advised her against doing this.  She is taking the dronabinol at bedtime and also takes 2 Tylenol PM. and 2 hydrocodone/APAP at bedtime.  I advised her to no longer take Tylenol PM with the hydrocodone as it contains Tylenol. She denies fevers or chills. She denies pain. Her appetite is good. Her weight has decreased 5 pounds over last 2 weeks.  REVIEW OF SYSTEMS:  Review of Systems  Constitutional: Positive for appetite change, fatigue and unexpected weight change. Negative for chills and fever.  HENT:   Negative for lump/mass, mouth sores and sore throat.   Respiratory: Negative for cough and shortness of breath.   Cardiovascular: Negative for chest pain and leg swelling.  Gastrointestinal: Positive for diarrhea (Controlled with medication), nausea (Controlled  with medication) and vomiting (Single episode last night). Negative for abdominal pain and constipation.  Endocrine: Negative for hot flashes.  Genitourinary: Negative for difficulty urinating, dysuria, frequency and hematuria.   Musculoskeletal: Positive for arthralgias. Negative for back pain, gait problem and myalgias.  Skin: Negative for rash.  Neurological: Positive for headaches and  light-headedness (With standing). Negative for extremity weakness, gait problem, numbness and speech difficulty.  Hematological: Negative for adenopathy. Does not bruise/bleed easily.  Psychiatric/Behavioral: Positive for depression. Negative for sleep disturbance. The patient is nervous/anxious.      VITALS:  Blood pressure 107/67, pulse (!) 113, temperature 98.7 F (37.1 C), resp. rate 16, height 5' 2"  (1.575 m), weight 121 lb 12.8 oz (55.2 kg), SpO2 99 %.  Blood pressure sitting 115/56 and pulse sitting 100.  Wt Readings from Last 3 Encounters:  10/03/20 120 lb 1.9 oz (54.5 kg)  10/03/20 121 lb 12.8 oz (55.2 kg)  09/28/20 129 lb (58.5 kg)    Body mass index is 22.28 kg/m.  Performance status (ECOG): 2 - Symptomatic, <50% confined to bed  PHYSICAL EXAM:  Physical Exam Vitals and nursing note reviewed.  Constitutional:      General: She is not in acute distress.    Appearance: Normal appearance.  HENT:     Head: Normocephalic and atraumatic.     Mouth/Throat:     Mouth: Mucous membranes are dry.     Pharynx: Oropharynx is clear. No oropharyngeal exudate or posterior oropharyngeal erythema.  Eyes:     General: No scleral icterus.    Extraocular Movements: Extraocular movements intact.     Conjunctiva/sclera: Conjunctivae normal.     Pupils: Pupils are equal, round, and reactive to light.  Cardiovascular:     Rate and Rhythm: Regular rhythm. Tachycardia present.     Heart sounds: Normal heart sounds. No murmur heard. No friction rub. No gallop.   Pulmonary:     Effort: Pulmonary effort is normal.     Breath sounds: Normal breath sounds. No wheezing, rhonchi or rales.  Chest:  Breasts:     Right: Normal. No swelling, bleeding, inverted nipple, mass, nipple discharge, skin change, tenderness, axillary adenopathy or supraclavicular adenopathy.     Left: No swelling, bleeding, inverted nipple, mass, nipple discharge, skin change, tenderness, axillary adenopathy or  supraclavicular adenopathy.      Comments:   There is general firmness in the left upper breast, but no discrete mass Abdominal:     General: There is no distension.     Palpations: Abdomen is soft. There is no mass.     Tenderness: There is no abdominal tenderness.  Musculoskeletal:        General: Normal range of motion.     Cervical back: Normal range of motion and neck supple. No tenderness.     Right lower leg: No edema.     Left lower leg: No edema.  Lymphadenopathy:     Cervical: No cervical adenopathy.     Upper Body:     Right upper body: No supraclavicular or axillary adenopathy.     Left upper body: No supraclavicular or axillary adenopathy.     Lower Body: No right inguinal adenopathy. No left inguinal adenopathy.  Skin:    General: Skin is warm and dry.     Coloration: Skin is not jaundiced.     Findings: No rash.  Neurological:     Mental Status: She is alert and oriented to person, place, and time.  Cranial Nerves: No cranial nerve deficit.  Psychiatric:        Attention and Perception: Attention normal.        Mood and Affect: Mood is anxious and depressed. Affect is tearful.        Speech: Speech normal.        Behavior: Behavior normal.        Thought Content: Thought content normal.        Cognition and Memory: Cognition normal.        Judgment: Judgment normal.    LABS:   CBC Latest Ref Rng & Units 10/03/2020 09/21/2020 09/12/2020  WBC - 7.8 5.7 3.0  Hemoglobin 12.0 - 16.0 10.6(A) 9.6(A) 10.4(A)  Hematocrit 36 - 46 33(A) 29(A) 31(A)  Platelets 150 - 399 258 227 176   CMP Latest Ref Rng & Units 10/03/2020 09/21/2020 09/12/2020  Glucose 70 - 99 mg/dL - - -  BUN 4 - 21 13 8  23(A)  Creatinine 0.5 - 1.1 0.5 0.4(A) 0.6  Sodium 137 - 147 135(A) 139 135(A)  Potassium 3.4 - 5.3 4.2 3.3(A) 3.6  Chloride 99 - 108 101 104 99  CO2 13 - 22 27(A) 31(A) 26(A)  Calcium 8.7 - 10.7 9.8 9.1 9.9  Alkaline Phos 25 - 125 127(A) 88 101  AST 13 - 35 27 21 24   ALT 7 - 35 27  23 27      No results found for: CEA1 / No results found for: CEA1 No results found for: PSA1 No results found for: TDS287 No results found for: CAN125  No results found for: TOTALPROTELP, ALBUMINELP, A1GS, A2GS, BETS, BETA2SER, GAMS, MSPIKE, SPEI No results found for: TIBC, FERRITIN, IRONPCTSAT No results found for: LDH  STUDIES:  No results found.    HISTORY:   Past Medical History:  Diagnosis Date  . Cancer ([facility])   . Depression   . Diabetes mellitus without complication ([place])   . Hyperlipidemia   . Hypomagnesemia 08/10/2020  . Sleep apnea     Past Surgical History:  Procedure Laterality Date  . ABDOMINAL HYSTERECTOMY    . BREAST SURGERY Left   . HYSTERECTOMY ABDOMINAL WITH SALPINGO-OOPHORECTOMY  07/2003   due to fibroids  . PORTACATH PLACEMENT Right 06/26/2020   Procedure: INSERTION PORT-A-CATH;  Surgeon: [name], MD;  Location: [facility];  Service: General;  Laterality: Right;  . TRIGGER FINGER RELEASE      Family History  Problem Relation Age of Onset  . Breast cancer Mother 71  . Breast cancer Maternal Aunt 25  . Ovarian cancer Maternal Grandmother 14  . Bone cancer Maternal Grandfather        27s  . Cancer Maternal Aunt 57    Social History:  reports that she has never smoked. She has never used smokeless tobacco. She reports current alcohol use. No history on file for drug use.The patient is alone today.  Allergies:  Allergies  Allergen Reactions  . Oxycodone Itching  . Codeine Other (See Comments)    Other reaction(s): Other (See Comments) Unknown Unknown   . Lisinopril     Other reaction(s): Cough (ALLERGY/intolerance)    Current Medications: Current Outpatient Medications  Medication Sig Dispense Refill  . aspirin 81 MG chewable tablet Chew by mouth.    . benzonatate (TESSALON) 100 MG capsule     . calcium-vitamin D 250-100 MG-UNIT tablet Take by mouth.    . celecoxib (CELEBREX) 100 MG capsule Take by mouth.    .  dexamethasone (  DECADRON) 4 MG tablet Take 2 tablets (8 mg total) by mouth 2 (two) times daily. Start the day before Taxotere. Then take daily x 3 days after chemotherapy. (Patient taking differently: Take 4 mg by mouth 2 (two) times daily. Start the day before Taxotere. Then take daily x 3 days after chemotherapy.) 30 tablet 1  . diphenoxylate-atropine (LOMOTIL) 2.5-0.025 MG tablet Take 2 tablets by mouth 4 (four) times daily as needed for diarrhea or loose stools. 60 tablet 3  . Docusate Sodium (DSS) 100 MG CAPS Take by mouth.    . dronabinol (MARINOL) 5 MG capsule Take 1 capsule (5 mg total) by mouth at bedtime. 30 capsule 2  . DULoxetine (CYMBALTA) 60 MG capsule Take 1 capsule by mouth daily.    Marland Kitchen FLUoxetine (PROZAC) 40 MG capsule Take by mouth.    . fluticasone (FLONASE) 50 MCG/ACT nasal spray     . HYDROcodone-acetaminophen (NORCO) 7.5-325 MG tablet Take 1 tablet by mouth every 4 (four) hours as needed for moderate pain. 120 tablet 0  . lidocaine-prilocaine (EMLA) cream Apply to affected area once 30 g 3  . LORazepam (ATIVAN) 0.5 MG tablet Take 1 tablet (0.5 mg total) by mouth every 6 (six) hours as needed for anxiety. 90 tablet 0  . losartan (COZAAR) 50 MG tablet daily.    . magic mouthwash (nystatin, lidocaine, diphenhydrAMINE) suspension Take 5 mLs by mouth every 3 (three) hours as needed for mouth pain. Swish & Spit; 249m bottle 3 [facility] @@ 1305 per Melissa P,NP    . magnesium oxide (MAG-OX) 400 (241.3 Mg) MG tablet Take 1 tablet (400 mg total) by mouth 2 (two) times daily. 60 tablet 1  . metFORMIN (GLUCOPHAGE) 500 MG tablet 2 (two) times daily.    . Multiple Vitamins-Minerals (THERA-M) TABS Take by mouth.    . ondansetron (ZOFRAN) 8 MG tablet Take 1 tablet (8 mg total) by mouth 2 (two) times daily as needed (Nausea or vomiting). Start on the third day after chemotherapy. 30 tablet 1  . prochlorperazine (COMPAZINE) 10 MG tablet Take 1 tablet (10 mg total) by  mouth every 6 (six) hours as needed (Nausea or vomiting). 30 tablet 1  . rosuvastatin (CRESTOR) 40 MG tablet daily.    . traMADol (ULTRAM) 50 MG tablet Take 1-2 tablets (50-100 mg total) by mouth every 6 (six) hours as needed. 60 tablet 0  . vitamin B-12 (CYANOCOBALAMIN) 500 MCG tablet Take 500 mcg by mouth daily.     No current facility-administered medications for this visit.   Facility-Administered Medications Ordered in Other Visits  Medication Dose Route Frequency Provider Last Rate Last Admin  . heparin lock flush 100 unit/mL  500 Units Intracatheter Once PRN Dalina Samara, KThalia Bloodgood PA-C         ASSESSMENT & PLAN:   Assessment:  1.  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 This is felt to most likely represent a recurrence rather than a 2nd breast primary. There really is no way to prove this and the slides from her prior cancer are no longer available. She is receiving neoadjuvant TCHP and has had much difficulty tolerating it. After 4 cycles, the doses were reduced by 25%. She still is experiencing adverse effects, but they are better controlled with medications.  2. Diarrhea, controlled with medication  3. Neuropathy, which is stable.  4.Hypomagnesemia, for which she is on oral magnesium supplement.  Magnesium is pending from today.  5. Taste changes, decreased appetite and weight loss.  She had gained 5 lb, but now lost this again.  She continues dronabinol 5 mg at bedtime.  6. Nausea and vomiting despite Zofran and Compazine , better controlled with the addition of lorazepam and dronabinol.  7. Clinical dehydration, I will give her IV fluids today.  8.  Anxiety and depression, despite duloxetine 60 mg daily. I advised against her doubling this dose.  I recommended she try taking  the lorazepam 0.5 mg a half to 1 tablet twice daily for her anxiety. She is willing to consider counseling and we gave her the names of counselors in area.  If her symptoms do not improve, we will have her to see primary care provider for further recommendations.  Plan:    She will receive IV fluids again today.  She will try taking lorazepam twice daily and at bedtime to better control her anxiety.  If this is ineffective, she will contact us.  She knows to continue duloxetine, just the single dose daily.  She knows not to take additional Tylenol with her hydrocodone/APAP.  I told her would be okay to take Benadryl at bedtime if she has felt she still needed this for sleep , but part of why she feels "loopy" is medication effects.  She knows to continue her medications for nausea, vomiting and diarrhea as previously instructed. The patient understands the plans discussed today and is in agreement with them.  She knows to contact our office if she develops concerns prior to her next appointment.   I provided 30 minutes of face-to-face time during this this encounter and > 50% was spent counseling as documented under my assessment and plan.    Marvia Pickles, PA-C

## 2020-10-03 NOTE — Progress Notes (Signed)
Pt d/c stable at 1233

## 2020-10-03 NOTE — Patient Instructions (Signed)
Dehydration, Adult Dehydration is condition in which there is not enough water or other fluids in the body. This happens when a person loses more fluids than he or she takes in. Important body parts cannot work right without the right amount of fluids. Any loss of fluids from the body can cause dehydration. Dehydration can be mild, worse, or very bad. It should be treated right away to keep it from getting very bad. What are the causes? This condition may be caused by:  Conditions that cause loss of water or other fluids, such as: ? Watery poop (diarrhea). ? Vomiting. ? Sweating a lot. ? Peeing (urinating) a lot.  Not drinking enough fluids, especially when you: ? Are ill. ? Are doing things that take a lot of energy to do.  Other illnesses and conditions, such as fever or infection.  Certain medicines, such as medicines that take extra fluid out of the body (diuretics).  Lack of safe drinking water.  Not being able to get enough water and food. What increases the risk? The following factors may make you more likely to develop this condition:  Having a long-term (chronic) illness that has not been treated the right way, such as: ? Diabetes. ? Heart disease. ? Kidney disease.  Being 65 years of age or older.  Having a disability.  Living in a place that is high above the ground or sea (high in altitude). The thinner, dried air causes more fluid loss.  Doing exercises that put stress on your body for a long time. What are the signs or symptoms? Symptoms of dehydration depend on how bad it is. Mild or worse dehydration  Thirst.  Dry lips or dry mouth.  Feeling dizzy or light-headed, especially when you stand up from sitting.  Muscle cramps.  Your body making: ? Dark pee (urine). Pee may be the color of tea. ? Less pee than normal. ? Less tears than normal.  Headache. Very bad dehydration  Changes in skin. Skin may: ? Be cold to the touch (clammy). ? Be blotchy  or pale. ? Not go back to normal right after you lightly pinch it and let it go.  Little or no tears, pee, or sweat.  Changes in vital signs, such as: ? Fast breathing. ? Low blood pressure. ? Weak pulse. ? Pulse that is more than 100 beats a minute when you are sitting still.  Other changes, such as: ? Feeling very thirsty. ? Eyes that look hollow (sunken). ? Cold hands and feet. ? Being mixed up (confused). ? Being very tired (lethargic) or having trouble waking from sleep. ? Short-term weight loss. ? Loss of consciousness. How is this treated? Treatment for this condition depends on how bad it is. Treatment should start right away. Do not wait until your condition gets very bad. Very bad dehydration is an emergency. You will need to go to a hospital.  Mild or worse dehydration can be treated at home. You may be asked to: ? Drink more fluids. ? Drink an oral rehydration solution (ORS). This drink helps get the right amounts of fluids and salts and minerals in the blood (electrolytes).  Very bad dehydration can be treated: ? With fluids through an IV tube. ? By getting normal levels of salts and minerals in your blood. This is often done by giving salts and minerals through a tube. The tube is passed through your nose and into your stomach. ? By treating the root cause. Follow these instructions at   home: Oral rehydration solution If told by your doctor, drink an ORS:  Make an ORS. Use instructions on the package.  Start by drinking small amounts, about  cup (120 mL) every 5-10 minutes.  Slowly drink more until you have had the amount that your doctor said to have. Eating and drinking  Drink enough clear fluid to keep your pee pale yellow. If you were told to drink an ORS, finish the ORS first. Then, start slowly drinking other clear fluids. Drink fluids such as: ? Water. Do not drink only water. Doing that can make the salt (sodium) level in your body get too low. ? Water  from ice chips you suck on. ? Fruit juice that you have added water to (diluted). ? Low-calorie sports drinks.  Eat foods that have the right amounts of salts and minerals, such as: ? Bananas. ? Oranges. ? Potatoes. ? Tomatoes. ? Spinach.  Do not drink alcohol.  Avoid: ? Drinks that have a lot of sugar. These include:  High-calorie sports drinks.  Fruit juice that you did not add water to.  Soda.  Caffeine. ? Foods that are greasy or have a lot of fat or sugar.         General instructions  Take over-the-counter and prescription medicines only as told by your doctor.  Do not take salt tablets. Doing that can make the salt level in your body get too high.  Return to your normal activities as told by your doctor. Ask your doctor what activities are safe for you.  Keep all follow-up visits as told by your doctor. This is important. Contact a doctor if:  You have pain in your belly (abdomen) and the pain: ? Gets worse. ? Stays in one place.  You have a rash.  You have a stiff neck.  You get angry or annoyed (irritable) more easily than normal.  You are more tired or have a harder time waking than normal.  You feel: ? Weak or dizzy. ? Very thirsty. Get help right away if you have:  Any symptoms of very bad dehydration.  Symptoms of vomiting, such as: ? You cannot eat or drink without vomiting. ? Your vomiting gets worse or does not go away. ? Your vomit has blood or green stuff in it.  Symptoms that get worse with treatment.  A fever.  A very bad headache.  Problems with peeing or pooping (having a bowel movement), such as: ? Watery poop that gets worse or does not go away. ? Blood in your poop (stool). This may cause poop to look black and tarry. ? Not peeing in 6-8 hours. ? Peeing only a small amount of very dark pee in 6-8 hours.  Trouble breathing. These symptoms may be an emergency. Do not wait to see if the symptoms will go away. Get  medical help right away. Call your local emergency services (911 in the [place] Do not drive yourself to the hospital. Summary  Dehydration is a condition in which there is not enough water or other fluids in the body. This happens when a person loses more fluids than he or she takes in.  Treatment for this condition depends on how bad it is. Treatment should be started right away. Do not wait until your condition gets very bad.  Drink enough clear fluid to keep your pee pale yellow. If you were told to drink an oral rehydration solution (ORS), finish the ORS first. Then, start slowly drinking other clear fluids.    Take over-the-counter and prescription medicines only as told by your doctor.  Get help right away if you have any symptoms of very bad dehydration. This information is not intended to replace advice given to you by your health care provider. Make sure you discuss any questions you have with your health care provider. Document Revised: 12/16/2018 Document Reviewed: 12/16/2018 [employer] Patient Education  2021 [employer]  

## 2020-10-16 ENCOUNTER — Other Ambulatory Visit: Payer: Self-pay

## 2020-10-16 ENCOUNTER — Encounter: Payer: Self-pay | Admitting: Hematology and Oncology

## 2020-10-16 ENCOUNTER — Inpatient Hospital Stay (INDEPENDENT_AMBULATORY_CARE_PROVIDER_SITE_OTHER): Payer: Medicare Other | Admitting: Hematology and Oncology

## 2020-10-16 ENCOUNTER — Encounter: Payer: Self-pay | Admitting: Oncology

## 2020-10-16 ENCOUNTER — Inpatient Hospital Stay: Payer: Medicare Other

## 2020-10-16 VITALS — BP 116/61 | HR 106 | Temp 98.1°F | Resp 18 | Ht 62.0 in | Wt 124.1 lb

## 2020-10-16 DIAGNOSIS — C50912 Malignant neoplasm of unspecified site of left female breast: Secondary | ICD-10-CM | POA: Diagnosis not present

## 2020-10-16 DIAGNOSIS — R110 Nausea: Secondary | ICD-10-CM

## 2020-10-16 DIAGNOSIS — C50911 Malignant neoplasm of unspecified site of right female breast: Secondary | ICD-10-CM

## 2020-10-16 LAB — COMPREHENSIVE METABOLIC PANEL
Albumin: 4.2 (ref 3.5–5.0)
Calcium: 9.3 (ref 8.7–10.7)

## 2020-10-16 LAB — BASIC METABOLIC PANEL
BUN: 13 (ref 4–21)
CO2: 32 — AB (ref 13–22)
Chloride: 102 (ref 99–108)
Creatinine: 0.5 (ref 0.5–1.1)
Glucose: 144
Potassium: 3.5 (ref 3.4–5.3)
Sodium: 138 (ref 137–147)

## 2020-10-16 LAB — CBC AND DIFFERENTIAL
HCT: 30 — AB (ref 36–46)
Hemoglobin: 10.1 — AB (ref 12.0–16.0)
Neutrophils Absolute: 3.1
Platelets: 203 (ref 150–399)
WBC: 4.7

## 2020-10-16 LAB — CBC: RBC: 2.99 — AB (ref 3.87–5.11)

## 2020-10-16 LAB — HEPATIC FUNCTION PANEL
ALT: 31 (ref 7–35)
AST: 32 (ref 13–35)
Alkaline Phosphatase: 79 (ref 25–125)
Bilirubin, Total: 0.5

## 2020-10-16 NOTE — Progress Notes (Signed)
[facility]  [address] [place],  [place]  [address] [phone]  Clinic Day:  10/16/2020  Referring physician: Delphina Cahill, FNP   CHIEF COMPLAINT:  CC:  Hormone and HER2/neu receptor positive breast cancer  Current Treatment:   Neoadjuvant docetaxel/carboplatin/trastuzumab/pertuzumab (TCHP)  HISTORY OF PRESENT ILLNESS:  Tammy Fowler is a 70 y.o. 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 Excisional biopsy revealed a 2 cm, grade 2, infiltrating ductal carcinoma in the left lower quadrant. Estrogen and progesterone receptors were positive and her 2 Neu negative. Tumor was present at the margins, so she underwent re-excision and axillary dissection. 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 Four of sixteen lymph nodes were positive for metastasis. All surgical margins were free of tumor involvement. She received adjuvant chemotherapy with with dose dense Adriamycin and Cytoxan for 4 cycles, followed by dose dense Taxol for 4 cycles, follwed by adjuvant radiation of the left breast. She was placed on anastrozole 1 mg daily in April 2005 and completed 10 years of therapy in April 2015. She underwent left latissimus dorsi muscle reconstruction of the left breast and right breast reduction. She underwent total abdominal hysterectomy and bilateral salpingo-oophorectomy prior to starting anastrozole. She had excision of a benign mass from the left breast in 2009. She underwent testing for BRCA mutations, including BART, in April 2013, which was negative, however, we did not have the results. She has a history of osteoporosis, previously treated with zolendronic acid every 6 months, which was discontinued when she completed anastrozole.   We began seeing her in July 2014, when she relocated to this area. Bone density  scan in December 2015 was normal. Her personal and family history wass suggestive of a hereditary cancer syndrome, so we recommended additional genetic testing with the [employer] myRisk Update Panel test to look for a mutation in other genes known to increase the risk for breast cancer and as well as other cancers. 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 These results were reviewed with her in detail and she was scheduled for an breast MRI which did not reveal any evidence of malignancy. She was unsure about undergoing prophylactic bilateral mastectomies. She was seen by Dr. Coralie Keens who referred her to Dr. Henrietta Hoover regarding reconstructive surgery. She was interested in reconstruction and the plastic surgeon that she saw discouraged her because of the increased risk of complications with surgery on previously irradiated tissue. The patient so far has continued close surveillance with annual mammogram and breast MRI spaced out 6 months. Bone density in January 2019was normal. Breast MRI in August 2019 did not reveal any evidence of malignancyShe developed diabetes and had an MRI abdomen, which did not reveal any evidence of pancreatic cancer. She had colonoscopy in early 2020by Dr. Marcell Anger in [place] and had 2 polyps removed. Repeat colonoscopy in 5 years was recommended. Bilateral screening mammogram in March 2020 and April 2021 did not reveal any evidence of malignancy.  She was seen in January 2022 with a new palpable mass in the left breast. Diagnostic left mammogram revealed a suspicious mass measuring 2.0 cm at 10 o'clock. Diagnostic right mammogram did not reveal any evidence of malignancy.  Ultrasound guided biopsy revealed poorly differentiated carcinoma in a background of adipose tissue with fibrosis and  necrosis, consistent with breast origin. Estrogen receptor was 30% positive and progesterone receptor negative and HER2 receptor positive. Ki67 was 25%. However, there was no breast tissue in the sample, so this was felt to be more likely a recurrence rather than a new breast primary, even though it has been many years. Her previous cancer was HER2 negative and ER positive.   We recommended neoadjuvant chemotherapy/HER2 targeted therapy with docetaxel/carboplatin/trastuzumab/pertuzumab [facility]) which she started treatment with [facility] on February 15th. She has experienced decreased appetite, taste changes, mouth sores, and diarrhea despite imodium. She was given a prescription for Lomotil to better control her diarrhea. MBX has been effective for her mouth sores She also had heartburn for which omeprazole has been effective. She received a 3rd cycle of TCHP on March 30th. At that time she continued to report diarrhea, but was not alternating Lomotil and Imodium. She has continued to lose weight, as she has not been eating well due to taste changes and decreased appetite.  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 After her 4th cycle of TCHP she presented to clinic with uncontrolled nausea and vomiting, as well dizziness and blurry vision. MRI brain did not reveal any intracranial metastasis.  She was given IV fluids and placed on lorazepam 0.5 mg every 6 hours as needed for nausea, vomiting and anxiety.  She was also placed on dronabinol 5 mg at bedtime for nausea, vomiting and appetite.  At her visit on May 6th, her side effects were fairly well controlled and she had gained 5 lb  She proceeded with a 5th cycle of TCHP at 25% dose reduction on May 11th..  INTERVAL HISTORY:  Tammy Fowler is here for evaluation prior to cycle 6 TCHP. She feels better since last visit. She denies fever, chills, nausea or vomiting. She  denies shortness of breath, chest pain or cough. She denies issue with bowel or bladder. CBC and CMP are unremarkable today. Her appetite is improved and she has gained 4 pounds since her last visit.   REVIEW OF SYSTEMS:  Review of Systems  Constitutional: Negative for appetite change, chills, fatigue, fever and unexpected weight change.  HENT:   Negative for lump/mass, mouth sores and sore throat.   Respiratory: Negative for cough and shortness of breath.   Cardiovascular: Negative for chest pain and leg swelling.  Gastrointestinal: Negative for abdominal pain, constipation, diarrhea (Controlled with medication), nausea (Controlled with medication) and vomiting (Single episode last night).  Endocrine: Negative for hot flashes.  Genitourinary: Negative for difficulty urinating, dysuria, frequency and hematuria.   Musculoskeletal: Positive for arthralgias. Negative for back pain, gait problem and myalgias.  Skin: Negative for rash.  Neurological: Negative for extremity weakness, gait problem, headaches, light-headedness (With standing), numbness and speech difficulty.  Hematological: Negative for adenopathy. Does not bruise/bleed easily.  Psychiatric/Behavioral: Negative for depression and sleep disturbance. The patient is not nervous/anxious.      VITALS:  Blood pressure 116/61, pulse (!) 106, temperature 98.1 F (36.7 C), temperature source Oral, resp. rate 18, height 5' 2"  (1.575 m), weight 124 lb 1.6 oz (56.3 kg), SpO2 99 %.  Blood pressure sitting 115/56 and pulse sitting 100.  Wt Readings from Last 3 Encounters:  10/16/20 124 lb 1.6 oz (56.3 kg)  10/03/20 120 lb 1.9 oz (54.5 kg)  10/03/20 121  lb 12.8 oz (55.2 kg)    Body mass index is 22.7 kg/m.  Performance status (ECOG): 2 - Symptomatic, <50% confined to bed  PHYSICAL EXAM:  Physical Exam Vitals and nursing note reviewed.  Constitutional:      General: She is not in acute distress.    Appearance: Normal appearance. She is  normal weight. She is not ill-appearing, toxic-appearing or diaphoretic.  HENT:     Head: Normocephalic and atraumatic.     Right Ear: Tympanic membrane normal.     Left Ear: Tympanic membrane normal.     Nose: Nose normal. No congestion or rhinorrhea.     Mouth/Throat:     Mouth: Mucous membranes are dry.     Pharynx: Oropharynx is clear. No oropharyngeal exudate or posterior oropharyngeal erythema.  Eyes:     General: No scleral icterus.       Right eye: No discharge.        Left eye: No discharge.     Extraocular Movements: Extraocular movements intact.     Conjunctiva/sclera: Conjunctivae normal.     Pupils: Pupils are equal, round, and reactive to light.  Neck:     Vascular: No carotid bruit.  Cardiovascular:     Rate and Rhythm: Normal rate and regular rhythm.     Heart sounds: Normal heart sounds. No murmur heard. No friction rub. No gallop.   Pulmonary:     Effort: Pulmonary effort is normal. No respiratory distress.     Breath sounds: Normal breath sounds. No stridor. No wheezing, rhonchi or rales.  Chest:     Chest wall: No tenderness.  Breasts:     Right: Normal. No swelling, bleeding, inverted nipple, mass, nipple discharge, skin change, tenderness, axillary adenopathy or supraclavicular adenopathy.     Left: No swelling, bleeding, inverted nipple, mass, nipple discharge, skin change, tenderness, axillary adenopathy or supraclavicular adenopathy.      Comments:    Abdominal:     General: Abdomen is flat. Bowel sounds are normal. There is no distension.     Palpations: Abdomen is soft. There is no mass.     Tenderness: There is no abdominal tenderness. There is no right CVA tenderness, left CVA tenderness, guarding or rebound.     Hernia: No hernia is present.  Musculoskeletal:        General: No swelling, tenderness, deformity or signs of injury. Normal range of motion.     Cervical back: Normal range of motion and neck supple. No rigidity or tenderness.     Right  lower leg: No edema.     Left lower leg: No edema.  Lymphadenopathy:     Cervical: No cervical adenopathy.     Upper Body:     Right upper body: No supraclavicular or axillary adenopathy.     Left upper body: No supraclavicular or axillary adenopathy.     Lower Body: No right inguinal adenopathy. No left inguinal adenopathy.  Skin:    General: Skin is warm and dry.     Capillary Refill: Capillary refill takes less than 2 seconds.     Coloration: Skin is not jaundiced or pale.     Findings: No bruising, erythema, lesion or rash.  Neurological:     General: No focal deficit present.     Mental Status: She is alert and oriented to person, place, and time. Mental status is at baseline.     Cranial Nerves: No cranial nerve deficit.     Sensory: No sensory deficit.  Motor: No weakness.     Coordination: Coordination normal.     Gait: Gait normal.     Deep Tendon Reflexes: Reflexes normal.  Psychiatric:        Attention and Perception: Attention normal.        Mood and Affect: Mood is not anxious or depressed. Affect is not tearful.        Speech: Speech normal.        Behavior: Behavior normal.        Thought Content: Thought content normal.        Cognition and Memory: Cognition normal.        Judgment: Judgment normal.    LABS:   CBC Latest Ref Rng & Units 10/16/2020 10/03/2020 09/21/2020  WBC - 4.7 7.8 5.7  Hemoglobin 12.0 - 16.0 10.1(A) 10.6(A) 9.6(A)  Hematocrit 36 - 46 30(A) 33(A) 29(A)  Platelets 150 - 399 203 258 227   CMP Latest Ref Rng & Units 10/16/2020 10/03/2020 09/21/2020  Glucose 70 - 99 mg/dL - - -  BUN 4 - 21 13 13 8   Creatinine 0.5 - 1.1 0.5 0.5 0.4(A)  Sodium 137 - 147 138 135(A) 139  Potassium 3.4 - 5.3 3.5 4.2 3.3(A)  Chloride 99 - 108 102 101 104  CO2 13 - 22 32(A) 27(A) 31(A)  Calcium 8.7 - 10.7 9.3 9.8 9.1  Alkaline Phos 25 - 125 79 127(A) 88  AST 13 - 35 32 27 21  ALT 7 - 35 31 27 23      No results found for: CEA1 / No results found for: CEA1 No  results found for: PSA1 No results found for: FXJ883 No results found for: CAN125  No results found for: TOTALPROTELP, ALBUMINELP, A1GS, A2GS, BETS, BETA2SER, GAMS, MSPIKE, SPEI No results found for: TIBC, FERRITIN, IRONPCTSAT No results found for: LDH  STUDIES:  No results found.    HISTORY:   Past Medical History:  Diagnosis Date  . Cancer ([place])   . Depression   . Diabetes mellitus without complication ([place])   . Hyperlipidemia   . Hypomagnesemia 08/10/2020  . Sleep apnea     Past Surgical History:  Procedure Laterality Date  . ABDOMINAL HYSTERECTOMY    . BREAST SURGERY Left   . HYSTERECTOMY ABDOMINAL WITH SALPINGO-OOPHORECTOMY  07/2003   due to fibroids  . PORTACATH PLACEMENT Right 06/26/2020   Procedure: INSERTION PORT-A-CATH;  Surgeon: [name], MD;  Location: [facility];  Service: General;  Laterality: Right;  . TRIGGER FINGER RELEASE      Family History  Problem Relation Age of Onset  . Breast cancer Mother 13  . Breast cancer Maternal Aunt 25  . Ovarian cancer Maternal Grandmother 12  . Bone cancer Maternal Grandfather        31s  . Cancer Maternal Aunt 21    Social History:  reports that she has never smoked. She has never used smokeless tobacco. She reports current alcohol use. No history on file for drug use.The patient is alone today.  Allergies:  Allergies  Allergen Reactions  . Oxycodone Itching  . Codeine Other (See Comments)    Other reaction(s): Other (See Comments) Unknown Unknown   . Lisinopril     Other reaction(s): Cough (ALLERGY/intolerance)    Current Medications: Current Outpatient Medications  Medication Sig Dispense Refill  . aspirin 81 MG chewable tablet Chew by mouth.    . benzonatate (TESSALON) 100 MG capsule     . calcium-vitamin D 250-100 MG-UNIT  tablet Take by mouth.    . celecoxib (CELEBREX) 100 MG capsule Take by mouth.    . dexamethasone (DECADRON) 4 MG tablet Take 2 tablets (8 mg total) by mouth  2 (two) times daily. Start the day before Taxotere. Then take daily x 3 days after chemotherapy. (Patient taking differently: Take 4 mg by mouth 2 (two) times daily. Start the day before Taxotere. Then take daily x 3 days after chemotherapy.) 30 tablet 1  . diphenoxylate-atropine (LOMOTIL) 2.5-0.025 MG tablet Take 2 tablets by mouth 4 (four) times daily as needed for diarrhea or loose stools. 60 tablet 3  . Docusate Sodium (DSS) 100 MG CAPS Take by mouth.    . dronabinol (MARINOL) 5 MG capsule Take 1 capsule (5 mg total) by mouth at bedtime. 30 capsule 2  . DULoxetine (CYMBALTA) 60 MG capsule Take 1 capsule by mouth daily.    Marland Kitchen FLUoxetine (PROZAC) 40 MG capsule Take by mouth.    . fluticasone (FLONASE) 50 MCG/ACT nasal spray     . HYDROcodone-acetaminophen (NORCO) 7.5-325 MG tablet Take 1 tablet by mouth every 4 (four) hours as needed for moderate pain. 120 tablet 0  . lidocaine-prilocaine (EMLA) cream Apply to affected area once 30 g 3  . LORazepam (ATIVAN) 0.5 MG tablet Take 1 tablet (0.5 mg total) by mouth every 6 (six) hours as needed for anxiety. 90 tablet 0  . losartan (COZAAR) 50 MG tablet daily.    . magic mouthwash (nystatin, lidocaine, diphenhydrAMINE) suspension Take 5 mLs by mouth every 3 (three) hours as needed for mouth pain. Swish & Spit; 226m bottle [facility] 1305 per Anay Rathe P,NP    . magnesium oxide (MAG-OX) 400 (241.3 Mg) MG tablet Take 1 tablet (400 mg total) by mouth 2 (two) times daily. 60 tablet 1  . metFORMIN (GLUCOPHAGE) 500 MG tablet 2 (two) times daily.    . Multiple Vitamins-Minerals (THERA-M) TABS Take by mouth.    . ondansetron (ZOFRAN) 8 MG tablet Take 1 tablet (8 mg total) by mouth 2 (two) times daily as needed (Nausea or vomiting). Start on the third day after chemotherapy. 30 tablet 1  . prochlorperazine (COMPAZINE) 10 MG tablet Take 1 tablet (10 mg total) by mouth every 6 (six) hours as needed (Nausea or vomiting). 30 tablet 1  .  rosuvastatin (CRESTOR) 40 MG tablet daily.    . traMADol (ULTRAM) 50 MG tablet Take 1-2 tablets (50-100 mg total) by mouth every 6 (six) hours as needed. 60 tablet 0  . vitamin B-12 (CYANOCOBALAMIN) 500 MCG tablet Take 500 mcg by mouth daily.     No current facility-administered medications for this visit.     ASSESSMENT & PLAN:   Assessment:  1.  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 This is felt to most likely represent a recurrence rather than a 2nd breast primary. There really is no way to prove this and the slides from her prior cancer are no longer available. She is receiving neoadjuvant TCHP and has had much difficulty tolerating it. After 4 cycles, the doses were reduced by 25%. She still is experiencing adverse effects, but they are better controlled with medications. She is due cycle 6 tomorrow. She is anxious for reconstructive surgery.  2. Dehydration. We will give an extra liter of fluids tomorrow  after chemo and again on Friday with her injection. We will give more fluids again on Monday and hopefully this will keep her from getting dehydrated with this final cycle of TCHP.  3.Nausea. I will refill her lorazepam today as this helps with nausea.     Plan:     She will proceed with cycle 6 and return to clinic in 3 weeks for continued treatment planning based upon her decision regarding reconstructive surgery will guide when we start back with maintenance HER2 therapy.   She verbalizes understanding of and agreement to the plans discussed today. She knows to call the office should any new questions or concerns arise.    [name], NP

## 2020-10-17 ENCOUNTER — Inpatient Hospital Stay: Payer: Medicare Other | Attending: Oncology

## 2020-10-17 VITALS — BP 110/61 | HR 99 | Temp 98.2°F | Resp 18 | Ht 62.0 in | Wt 125.1 lb

## 2020-10-17 DIAGNOSIS — Z803 Family history of malignant neoplasm of breast: Secondary | ICD-10-CM | POA: Insufficient documentation

## 2020-10-17 DIAGNOSIS — Z5111 Encounter for antineoplastic chemotherapy: Secondary | ICD-10-CM | POA: Insufficient documentation

## 2020-10-17 DIAGNOSIS — Z5112 Encounter for antineoplastic immunotherapy: Secondary | ICD-10-CM | POA: Insufficient documentation

## 2020-10-17 DIAGNOSIS — Z5189 Encounter for other specified aftercare: Secondary | ICD-10-CM | POA: Diagnosis not present

## 2020-10-17 DIAGNOSIS — Z79899 Other long term (current) drug therapy: Secondary | ICD-10-CM | POA: Diagnosis not present

## 2020-10-17 DIAGNOSIS — F418 Other specified anxiety disorders: Secondary | ICD-10-CM | POA: Diagnosis not present

## 2020-10-17 DIAGNOSIS — Z7982 Long term (current) use of aspirin: Secondary | ICD-10-CM | POA: Diagnosis not present

## 2020-10-17 DIAGNOSIS — E860 Dehydration: Secondary | ICD-10-CM | POA: Diagnosis not present

## 2020-10-17 DIAGNOSIS — Z170 Estrogen receptor positive status [ER+]: Secondary | ICD-10-CM | POA: Insufficient documentation

## 2020-10-17 DIAGNOSIS — C50212 Malignant neoplasm of upper-inner quadrant of left female breast: Secondary | ICD-10-CM | POA: Diagnosis present

## 2020-10-17 DIAGNOSIS — C50912 Malignant neoplasm of unspecified site of left female breast: Secondary | ICD-10-CM

## 2020-10-17 DIAGNOSIS — E119 Type 2 diabetes mellitus without complications: Secondary | ICD-10-CM | POA: Insufficient documentation

## 2020-10-17 DIAGNOSIS — Z8041 Family history of malignant neoplasm of ovary: Secondary | ICD-10-CM | POA: Diagnosis not present

## 2020-10-17 MED ORDER — PALONOSETRON HCL INJECTION 0.25 MG/5ML
INTRAVENOUS | Status: AC
  Filled 2020-10-17: qty 5

## 2020-10-17 MED ORDER — DIPHENHYDRAMINE HCL 50 MG/ML IJ SOLN
25.0000 mg | Freq: Once | INTRAMUSCULAR | Status: AC
  Administered 2020-10-17: 25 mg via INTRAVENOUS

## 2020-10-17 MED ORDER — ACETAMINOPHEN 325 MG PO TABS
650.0000 mg | ORAL_TABLET | Freq: Once | ORAL | Status: AC
  Administered 2020-10-17: 650 mg via ORAL

## 2020-10-17 MED ORDER — DEXAMETHASONE SODIUM PHOSPHATE 10 MG/ML IJ SOLN
8.0000 mg | Freq: Once | INTRAMUSCULAR | Status: AC
  Administered 2020-10-17: 8 mg via INTRAVENOUS

## 2020-10-17 MED ORDER — DIPHENHYDRAMINE HCL 50 MG/ML IJ SOLN
INTRAMUSCULAR | Status: AC
  Filled 2020-10-17: qty 1

## 2020-10-17 MED ORDER — SODIUM CHLORIDE 0.9 % IV SOLN
150.0000 mg | Freq: Once | INTRAVENOUS | Status: AC
  Administered 2020-10-17: 150 mg via INTRAVENOUS
  Filled 2020-10-17: qty 150

## 2020-10-17 MED ORDER — TRASTUZUMAB-ANNS CHEMO 150 MG IV SOLR
6.0000 mg/kg | Freq: Once | INTRAVENOUS | Status: AC
  Administered 2020-10-17: 336 mg via INTRAVENOUS
  Filled 2020-10-17: qty 16

## 2020-10-17 MED ORDER — SODIUM CHLORIDE 0.9 % IV SOLN
Freq: Once | INTRAVENOUS | Status: AC
Start: 2020-10-17 — End: 2020-10-17
  Filled 2020-10-17: qty 250

## 2020-10-17 MED ORDER — PALONOSETRON HCL INJECTION 0.25 MG/5ML
0.2500 mg | Freq: Once | INTRAVENOUS | Status: AC
  Administered 2020-10-17: 0.25 mg via INTRAVENOUS

## 2020-10-17 MED ORDER — DEXAMETHASONE SODIUM PHOSPHATE 10 MG/ML IJ SOLN
INTRAMUSCULAR | Status: AC
  Filled 2020-10-17: qty 1

## 2020-10-17 MED ORDER — SODIUM CHLORIDE 0.9 % IV SOLN
56.2500 mg/m2 | Freq: Once | INTRAVENOUS | Status: AC
  Administered 2020-10-17: 90 mg via INTRAVENOUS
  Filled 2020-10-17: qty 9

## 2020-10-17 MED ORDER — SODIUM CHLORIDE 0.9 % IV SOLN
461.4000 mg | Freq: Once | INTRAVENOUS | Status: AC
  Administered 2020-10-17: 460 mg via INTRAVENOUS
  Filled 2020-10-17: qty 46

## 2020-10-17 MED ORDER — HEPARIN SOD (PORK) LOCK FLUSH 100 UNIT/ML IV SOLN
500.0000 [IU] | Freq: Once | INTRAVENOUS | Status: AC | PRN
  Administered 2020-10-17: 500 [IU]
  Filled 2020-10-17: qty 5

## 2020-10-17 MED ORDER — ACETAMINOPHEN 325 MG PO TABS
ORAL_TABLET | ORAL | Status: AC
  Filled 2020-10-17: qty 2

## 2020-10-17 MED ORDER — SODIUM CHLORIDE 0.9 % IV SOLN
420.0000 mg | Freq: Once | INTRAVENOUS | Status: AC
  Administered 2020-10-17: 420 mg via INTRAVENOUS
  Filled 2020-10-17: qty 14

## 2020-10-17 NOTE — Progress Notes (Signed)
Pt d/c stable at 1415 

## 2020-10-17 NOTE — Patient Instructions (Signed)
[facility]  Discharge Instructions: Thank you for choosing [facility] to provide your oncology and hematology care.  If you have a lab appointment with the [facility], please go directly to the [facility] and check in at the registration area.   Wear comfortable clothing and clothing appropriate for easy access to any Portacath or PICC line.   We strive to give you quality time with your provider. You may need to reschedule your appointment if you arrive late (15 or more minutes).  Arriving late affects you and other patients whose appointments are after yours.  Also, if you miss three or more appointments without notifying the office, you may be dismissed from the clinic at the provider's discretion.      For prescription refill requests, have your pharmacy contact our office and allow 72 hours for refills to be completed.    Today you received the following chemotherapy and/or immunotherapy agents taxol, perjeta, carbo, kanjinti     To help prevent nausea and vomiting after your treatment, we encourage you to take your nausea medication as directed.  BELOW ARE SYMPTOMS THAT SHOULD BE REPORTED IMMEDIATELY: . *FEVER GREATER THAN 100.4 F (38 C) OR HIGHER . *CHILLS OR SWEATING . *NAUSEA AND VOMITING THAT IS NOT CONTROLLED WITH YOUR NAUSEA MEDICATION . *UNUSUAL SHORTNESS OF BREATH . *UNUSUAL BRUISING OR BLEEDING . *URINARY PROBLEMS (pain or burning when urinating, or frequent urination) . *BOWEL PROBLEMS (unusual diarrhea, constipation, pain near the anus) . TENDERNESS IN MOUTH AND THROAT WITH OR WITHOUT PRESENCE OF ULCERS (sore throat, sores in mouth, or a toothache) . UNUSUAL RASH, SWELLING OR PAIN  . UNUSUAL VAGINAL DISCHARGE OR ITCHING   Items with * indicate a potential emergency and should be followed up as soon as possible or go to the Emergency Department if any problems should occur.  Please show the CHEMOTHERAPY ALERT CARD or  IMMUNOTHERAPY ALERT CARD at check-in to the Emergency Department and triage nurse.  Should you have questions after your visit or need to cancel or reschedule your appointment, please contact [facility]  Dept: [phone]  and follow the prompts.  Office hours are 8:00 a.m. to 4:30 p.m. Monday - Friday. Please note that voicemails left after 4:00 p.m. may not be returned until the following business day.  We are closed weekends and major holidays. You have access to a nurse at all times for urgent questions. Please call the main number to the clinic Dept: [phone] and follow the prompts.  For any non-urgent questions, you may also contact your provider using MyChart. We now offer e-Visits for anyone 58 and older to request care online for non-urgent symptoms. For details visit [email or web address].   Also download the MyChart app! Go to the app store, search "MyChart", open the app, select [facility], and log in with your MyChart username and password.  Due to Covid, a mask is required upon entering the hospital/clinic. If you do not have a mask, one will be given to you upon arrival. For doctor visits, patients may have 1 support person aged 2 or older with them. For treatment visits, patients cannot have anyone with them due to current Covid guidelines and our immunocompromised population.   Pertuzumab injection What is this medicine? PERTUZUMAB (per TOOZ ue mab) is a monoclonal antibody. It is used to treat breast cancer. This medicine may be used for other purposes; ask your health care provider or pharmacist if you  have questions. COMMON BRAND NAME(S): PERJETA What should I tell my health care provider before I take this medicine? They need to know if you have any of these conditions:  heart disease  heart failure  high blood pressure  history of irregular heart beat  recent or ongoing radiation therapy  an unusual or allergic reaction to  pertuzumab, other medicines, foods, dyes, or preservatives  pregnant or trying to get pregnant  breast-feeding How should I use this medicine? This medicine is for infusion into a vein. It is given by a health care professional in a hospital or clinic setting. Talk to your pediatrician regarding the use of this medicine in children. Special care may be needed. Overdosage: If you think you have taken too much of this medicine contact a poison control center or emergency room at once. NOTE: This medicine is only for you. Do not share this medicine with others. What if I miss a dose? It is important not to miss your dose. Call your doctor or health care professional if you are unable to keep an appointment. What may interact with this medicine? Interactions are not expected. Give your health care provider a list of all the medicines, herbs, non-prescription drugs, or dietary supplements you use. Also tell them if you smoke, drink alcohol, or use illegal drugs. Some items may interact with your medicine. This list may not describe all possible interactions. Give your health care provider a list of all the medicines, herbs, non-prescription drugs, or dietary supplements you use. Also tell them if you smoke, drink alcohol, or use illegal drugs. Some items may interact with your medicine. What should I watch for while using this medicine? Your condition will be monitored carefully while you are receiving this medicine. Report any side effects. Continue your course of treatment even though you feel ill unless your doctor tells you to stop. Do not become pregnant while taking this medicine or for 7 months after stopping it. Women should inform their doctor if they wish to become pregnant or think they might be pregnant. Women of child-bearing potential will need to have a negative pregnancy test before starting this medicine. There is a potential for serious side effects to an unborn child. Talk to your  health care professional or pharmacist for more information. Do not breast-feed an infant while taking this medicine or for 7 months after stopping it. Women must use effective birth control with this medicine. Call your doctor or health care professional for advice if you get a fever, chills or sore throat, or other symptoms of a cold or flu. Do not treat yourself. Try to avoid being around people who are sick. You may experience fever, chills, and headache during the infusion. Report any side effects during the infusion to your health care professional. What side effects may I notice from receiving this medicine? Side effects that you should report to your doctor or health care professional as soon as possible:  breathing problems  chest pain or palpitations  dizziness  feeling faint or lightheaded  fever or chills  skin rash, itching or hives  sore throat  swelling of the face, lips, or tongue  swelling of the legs or ankles  unusually weak or tired Side effects that usually do not require medical attention (report to your doctor or health care professional if they continue or are bothersome):  diarrhea  hair loss  nausea, vomiting  tiredness This list may not describe all possible side effects. Call your doctor  for medical advice about side effects. You may report side effects to FDA at 1-800-FDA-1088. Where should I keep my medicine? This drug is given in a hospital or clinic and will not be stored at home. NOTE: This sheet is a summary. It may not cover all possible information. If you have questions about this medicine, talk to your doctor, pharmacist, or health care provider.  2021 Elsevier/Gold Standard (2015-06-07 12:08:50) Trastuzumab injection for infusion What is this medicine? TRASTUZUMAB (tras TOO zoo mab) is a monoclonal antibody. It is used to treat breast cancer and stomach cancer. This medicine may be used for other purposes; ask your health care  provider or pharmacist if you have questions. COMMON BRAND NAME(S): Herceptin, Galvin Proffer, Trazimera What should I tell my health care provider before I take this medicine? They need to know if you have any of these conditions:  heart disease  heart failure  lung or breathing disease, like asthma  an unusual or allergic reaction to trastuzumab, benzyl alcohol, or other medications, foods, dyes, or preservatives  pregnant or trying to get pregnant  breast-feeding How should I use this medicine? This drug is given as an infusion into a vein. It is administered in a hospital or clinic by a specially trained health care professional. Talk to your pediatrician regarding the use of this medicine in children. This medicine is not approved for use in children. Overdosage: If you think you have taken too much of this medicine contact a poison control center or emergency room at once. NOTE: This medicine is only for you. Do not share this medicine with others. What if I miss a dose? It is important not to miss a dose. Call your doctor or health care professional if you are unable to keep an appointment. What may interact with this medicine? This medicine may interact with the following medications:  certain types of chemotherapy, such as daunorubicin, doxorubicin, epirubicin, and idarubicin This list may not describe all possible interactions. Give your health care provider a list of all the medicines, herbs, non-prescription drugs, or dietary supplements you use. Also tell them if you smoke, drink alcohol, or use illegal drugs. Some items may interact with your medicine. What should I watch for while using this medicine? Visit your doctor for checks on your progress. Report any side effects. Continue your course of treatment even though you feel ill unless your doctor tells you to stop. Call your doctor or health care professional for advice if you get a fever, chills  or sore throat, or other symptoms of a cold or flu. Do not treat yourself. Try to avoid being around people who are sick. You may experience fever, chills and shaking during your first infusion. These effects are usually mild and can be treated with other medicines. Report any side effects during the infusion to your health care professional. Fever and chills usually do not happen with later infusions. Do not become pregnant while taking this medicine or for 7 months after stopping it. Women should inform their doctor if they wish to become pregnant or think they might be pregnant. Women of child-bearing potential will need to have a negative pregnancy test before starting this medicine. There is a potential for serious side effects to an unborn child. Talk to your health care professional or pharmacist for more information. Do not breast-feed an infant while taking this medicine or for 7 months after stopping it. Women must use effective birth control with this medicine. What  side effects may I notice from receiving this medicine? Side effects that you should report to your doctor or health care professional as soon as possible:  allergic reactions like skin rash, itching or hives, swelling of the face, lips, or tongue  chest pain or palpitations  cough  dizziness  feeling faint or lightheaded, falls  fever  general ill feeling or flu-like symptoms  signs of worsening heart failure like breathing problems; swelling in your legs and feet  unusually weak or tired Side effects that usually do not require medical attention (report to your doctor or health care professional if they continue or are bothersome):  bone pain  changes in taste  diarrhea  joint pain  nausea/vomiting  weight loss This list may not describe all possible side effects. Call your doctor for medical advice about side effects. You may report side effects to FDA at 1-800-FDA-1088. Where should I keep my  medicine? This drug is given in a hospital or clinic and will not be stored at home. NOTE: This sheet is a summary. It may not cover all possible information. If you have questions about this medicine, talk to your doctor, pharmacist, or health care provider.  2021 Elsevier/Gold Standard (2016-04-29 14:37:52) Carboplatin injection What is this medicine? CARBOPLATIN (KAR boe pla tin) is a chemotherapy drug. It targets fast dividing cells, like cancer cells, and causes these cells to die. This medicine is used to treat ovarian cancer and many other cancers. This medicine may be used for other purposes; ask your health care provider or pharmacist if you have questions. COMMON BRAND NAME(S): Paraplatin What should I tell my health care provider before I take this medicine? They need to know if you have any of these conditions:  blood disorders  hearing problems  kidney disease  recent or ongoing radiation therapy  an unusual or allergic reaction to carboplatin, cisplatin, other chemotherapy, other medicines, foods, dyes, or preservatives  pregnant or trying to get pregnant  breast-feeding How should I use this medicine? This drug is usually given as an infusion into a vein. It is administered in a hospital or clinic by a specially trained health care professional. Talk to your pediatrician regarding the use of this medicine in children. Special care may be needed. Overdosage: If you think you have taken too much of this medicine contact a poison control center or emergency room at once. NOTE: This medicine is only for you. Do not share this medicine with others. What if I miss a dose? It is important not to miss a dose. Call your doctor or health care professional if you are unable to keep an appointment. What may interact with this medicine?  medicines for seizures  medicines to increase blood counts like filgrastim, pegfilgrastim, sargramostim  some antibiotics like amikacin,  gentamicin, neomycin, streptomycin, tobramycin  vaccines Talk to your doctor or health care professional before taking any of these medicines:  acetaminophen  aspirin  ibuprofen  ketoprofen  naproxen This list may not describe all possible interactions. Give your health care provider a list of all the medicines, herbs, non-prescription drugs, or dietary supplements you use. Also tell them if you smoke, drink alcohol, or use illegal drugs. Some items may interact with your medicine. What should I watch for while using this medicine? Your condition will be monitored carefully while you are receiving this medicine. You will need important blood work done while you are taking this medicine. This drug may make you feel generally unwell. This is  not uncommon, as chemotherapy can affect healthy cells as well as cancer cells. Report any side effects. Continue your course of treatment even though you feel ill unless your doctor tells you to stop. In some cases, you may be given additional medicines to help with side effects. Follow all directions for their use. Call your doctor or health care professional for advice if you get a fever, chills or sore throat, or other symptoms of a cold or flu. Do not treat yourself. This drug decreases your body's ability to fight infections. Try to avoid being around people who are sick. This medicine may increase your risk to bruise or bleed. Call your doctor or health care professional if you notice any unusual bleeding. Be careful brushing and flossing your teeth or using a toothpick because you may get an infection or bleed more easily. If you have any dental work done, tell your dentist you are receiving this medicine. Avoid taking products that contain aspirin, acetaminophen, ibuprofen, naproxen, or ketoprofen unless instructed by your doctor. These medicines may hide a fever. Do not become pregnant while taking this medicine. Women should inform their doctor if  they wish to become pregnant or think they might be pregnant. There is a potential for serious side effects to an unborn child. Talk to your health care professional or pharmacist for more information. Do not breast-feed an infant while taking this medicine. What side effects may I notice from receiving this medicine? Side effects that you should report to your doctor or health care professional as soon as possible:  allergic reactions like skin rash, itching or hives, swelling of the face, lips, or tongue  signs of infection - fever or chills, cough, sore throat, pain or difficulty passing urine  signs of decreased platelets or bleeding - bruising, pinpoint red spots on the skin, black, tarry stools, nosebleeds  signs of decreased red blood cells - unusually weak or tired, fainting spells, lightheadedness  breathing problems  changes in hearing  changes in vision  chest pain  high blood pressure  low blood counts - This drug may decrease the number of white blood cells, red blood cells and platelets. You may be at increased risk for infections and bleeding.  nausea and vomiting  pain, swelling, redness or irritation at the injection site  pain, tingling, numbness in the hands or feet  problems with balance, talking, walking  trouble passing urine or change in the amount of urine Side effects that usually do not require medical attention (report to your doctor or health care professional if they continue or are bothersome):  hair loss  loss of appetite  metallic taste in the mouth or changes in taste This list may not describe all possible side effects. Call your doctor for medical advice about side effects. You may report side effects to FDA at 1-800-FDA-1088. Where should I keep my medicine? This drug is given in a hospital or clinic and will not be stored at home. NOTE: This sheet is a summary. It may not cover all possible information. If you have questions about this  medicine, talk to your doctor, pharmacist, or health care provider.  2021 Elsevier/Gold Standard (2007-08-10 14:38:05) Paclitaxel injection What is this medicine? PACLITAXEL (PAK li TAX el) is a chemotherapy drug. It targets fast dividing cells, like cancer cells, and causes these cells to die. This medicine is used to treat ovarian cancer, breast cancer, lung cancer, Kaposi's sarcoma, and other cancers. This medicine may be used for other  purposes; ask your health care provider or pharmacist if you have questions. COMMON BRAND NAME(S): Onxol, Taxol What should I tell my health care provider before I take this medicine? They need to know if you have any of these conditions:  history of irregular heartbeat  liver disease  low blood counts, like low white cell, platelet, or red cell counts  lung or breathing disease, like asthma  tingling of the fingers or toes, or other nerve disorder  an unusual or allergic reaction to paclitaxel, alcohol, polyoxyethylated castor oil, other chemotherapy, other medicines, foods, dyes, or preservatives  pregnant or trying to get pregnant  breast-feeding How should I use this medicine? This drug is given as an infusion into a vein. It is administered in a hospital or clinic by a specially trained health care professional. Talk to your pediatrician regarding the use of this medicine in children. Special care may be needed. Overdosage: If you think you have taken too much of this medicine contact a poison control center or emergency room at once. NOTE: This medicine is only for you. Do not share this medicine with others. What if I miss a dose? It is important not to miss your dose. Call your doctor or health care professional if you are unable to keep an appointment. What may interact with this medicine? Do not take this medicine with any of the following medications:  live virus vaccines This medicine may also interact with the following  medications:  antiviral medicines for hepatitis, HIV or AIDS  certain antibiotics like erythromycin and clarithromycin  certain medicines for fungal infections like ketoconazole and itraconazole  certain medicines for seizures like carbamazepine, phenobarbital, phenytoin  gemfibrozil  nefazodone  rifampin  St. John's wort This list may not describe all possible interactions. Give your health care provider a list of all the medicines, herbs, non-prescription drugs, or dietary supplements you use. Also tell them if you smoke, drink alcohol, or use illegal drugs. Some items may interact with your medicine. What should I watch for while using this medicine? Your condition will be monitored carefully while you are receiving this medicine. You will need important blood work done while you are taking this medicine. This medicine can cause serious allergic reactions. To reduce your risk you will need to take other medicine(s) before treatment with this medicine. If you experience allergic reactions like skin rash, itching or hives, swelling of the face, lips, or tongue, tell your doctor or health care professional right away. In some cases, you may be given additional medicines to help with side effects. Follow all directions for their use. This drug may make you feel generally unwell. This is not uncommon, as chemotherapy can affect healthy cells as well as cancer cells. Report any side effects. Continue your course of treatment even though you feel ill unless your doctor tells you to stop. Call your doctor or health care professional for advice if you get a fever, chills or sore throat, or other symptoms of a cold or flu. Do not treat yourself. This drug decreases your body's ability to fight infections. Try to avoid being around people who are sick. This medicine may increase your risk to bruise or bleed. Call your doctor or health care professional if you notice any unusual bleeding. Be careful  brushing and flossing your teeth or using a toothpick because you may get an infection or bleed more easily. If you have any dental work done, tell your dentist you are receiving this medicine. Avoid  taking products that contain aspirin, acetaminophen, ibuprofen, naproxen, or ketoprofen unless instructed by your doctor. These medicines may hide a fever. Do not become pregnant while taking this medicine. Women should inform their doctor if they wish to become pregnant or think they might be pregnant. There is a potential for serious side effects to an unborn child. Talk to your health care professional or pharmacist for more information. Do not breast-feed an infant while taking this medicine. Men are advised not to father a child while receiving this medicine. This product may contain alcohol. Ask your pharmacist or healthcare provider if this medicine contains alcohol. Be sure to tell all healthcare providers you are taking this medicine. Certain medicines, like metronidazole and disulfiram, can cause an unpleasant reaction when taken with alcohol. The reaction includes flushing, headache, nausea, vomiting, sweating, and increased thirst. The reaction can last from 30 minutes to several hours. What side effects may I notice from receiving this medicine? Side effects that you should report to your doctor or health care professional as soon as possible:  allergic reactions like skin rash, itching or hives, swelling of the face, lips, or tongue  breathing problems  changes in vision  fast, irregular heartbeat  high or low blood pressure  mouth sores  pain, tingling, numbness in the hands or feet  signs of decreased platelets or bleeding - bruising, pinpoint red spots on the skin, black, tarry stools, blood in the urine  signs of decreased red blood cells - unusually weak or tired, feeling faint or lightheaded, falls  signs of infection - fever or chills, cough, sore throat, pain or difficulty  passing urine  signs and symptoms of liver injury like dark yellow or brown urine; general ill feeling or flu-like symptoms; light-colored stools; loss of appetite; nausea; right upper belly pain; unusually weak or tired; yellowing of the eyes or skin  swelling of the ankles, feet, hands  unusually slow heartbeat Side effects that usually do not require medical attention (report to your doctor or health care professional if they continue or are bothersome):  diarrhea  hair loss  loss of appetite  muscle or joint pain  nausea, vomiting  pain, redness, or irritation at site where injected  tiredness This list may not describe all possible side effects. Call your doctor for medical advice about side effects. You may report side effects to FDA at 1-800-FDA-1088. Where should I keep my medicine? This drug is given in a hospital or clinic and will not be stored at home. NOTE: This sheet is a summary. It may not cover all possible information. If you have questions about this medicine, talk to your doctor, pharmacist, or health care provider.  2021 [employer] (2019-04-06 13:37:23)

## 2020-10-19 ENCOUNTER — Other Ambulatory Visit: Payer: Self-pay

## 2020-10-19 ENCOUNTER — Inpatient Hospital Stay: Payer: Medicare Other

## 2020-10-19 ENCOUNTER — Telehealth: Payer: Self-pay | Admitting: Hematology and Oncology

## 2020-10-19 ENCOUNTER — Encounter: Payer: Self-pay | Admitting: Oncology

## 2020-10-19 ENCOUNTER — Other Ambulatory Visit: Payer: Self-pay | Admitting: Hematology and Oncology

## 2020-10-19 VITALS — BP 101/53 | HR 80 | Temp 98.1°F | Resp 18 | Ht 62.0 in | Wt 133.0 lb

## 2020-10-19 DIAGNOSIS — Z5112 Encounter for antineoplastic immunotherapy: Secondary | ICD-10-CM | POA: Diagnosis not present

## 2020-10-19 DIAGNOSIS — C50912 Malignant neoplasm of unspecified site of left female breast: Secondary | ICD-10-CM

## 2020-10-19 MED ORDER — SODIUM CHLORIDE 0.9 % IV SOLN
Freq: Once | INTRAVENOUS | Status: AC
  Filled 2020-10-19: qty 250

## 2020-10-19 MED ORDER — TRAMADOL HCL 50 MG PO TABS: 50.0000 mg | ORAL_TABLET | Freq: Four times a day (QID) | ORAL | 0 refills | Status: DC | PRN

## 2020-10-19 MED ORDER — HEPARIN SOD (PORK) LOCK FLUSH 100 UNIT/ML IV SOLN
500.0000 [IU] | Freq: Once | INTRAVENOUS | Status: AC | PRN
  Administered 2020-10-19: 500 [IU]
  Filled 2020-10-19: qty 5

## 2020-10-19 MED ORDER — PEGFILGRASTIM-BMEZ 6 MG/0.6ML ~~LOC~~ SOSY
PREFILLED_SYRINGE | SUBCUTANEOUS | Status: AC
  Filled 2020-10-19: qty 0.6

## 2020-10-19 MED ORDER — PEGFILGRASTIM-BMEZ 6 MG/0.6ML ~~LOC~~ SOSY
6.0000 mg | PREFILLED_SYRINGE | Freq: Once | SUBCUTANEOUS | Status: AC
  Administered 2020-10-19: 6 mg via SUBCUTANEOUS

## 2020-10-19 NOTE — Telephone Encounter (Signed)
Per 5/31 LOS, called patient with 6/21 Appt's

## 2020-10-19 NOTE — Patient Instructions (Signed)
  Dehydration, Adult Dehydration is condition in which there is not enough water or other fluids in the body. This happens when a person loses more fluids than he or she takes in. Important body parts cannot work right without the right amount of fluids. Any loss of fluids from the body can cause dehydration. Dehydration can be mild, worse, or very bad. It should be treated right away to keep it from getting very bad. What are the causes? This condition may be caused by:  Conditions that cause loss of water or other fluids, such as: ? Watery poop (diarrhea). ? Vomiting. ? Sweating a lot. ? Peeing (urinating) a lot.  Not drinking enough fluids, especially when you: ? Are ill. ? Are doing things that take a lot of energy to do.  Other illnesses and conditions, such as fever or infection.  Certain medicines, such as medicines that take extra fluid out of the body (diuretics).  Lack of safe drinking water.  Not being able to get enough water and food. What increases the risk? The following factors may make you more likely to develop this condition:  Having a long-term (chronic) illness that has not been treated the right way, such as: ? Diabetes. ? Heart disease. ? Kidney disease.  Being 65 years of age or older.  Having a disability.  Living in a place that is high above the ground or sea (high in altitude). The thinner, dried air causes more fluid loss.  Doing exercises that put stress on your body for a long time. What are the signs or symptoms? Symptoms of dehydration depend on how bad it is. Mild or worse dehydration  Thirst.  Dry lips or dry mouth.  Feeling dizzy or light-headed, especially when you stand up from sitting.  Muscle cramps.  Your body making: ? Dark pee (urine). Pee may be the color of tea. ? Less pee than normal. ? Less tears than normal.  Headache. Very bad dehydration  Changes in skin. Skin may: ? Be cold to the touch (clammy). ? Be  blotchy or pale. ? Not go back to normal right after you lightly pinch it and let it go.  Little or no tears, pee, or sweat.  Changes in vital signs, such as: ? Fast breathing. ? Low blood pressure. ? Weak pulse. ? Pulse that is more than 100 beats a minute when you are sitting still.  Other changes, such as: ? Feeling very thirsty. ? Eyes that look hollow (sunken). ? Cold hands and feet. ? Being mixed up (confused). ? Being very tired (lethargic) or having trouble waking from sleep. ? Short-term weight loss. ? Loss of consciousness. How is this treated? Treatment for this condition depends on how bad it is. Treatment should start right away. Do not wait until your condition gets very bad. Very bad dehydration is an emergency. You will need to go to a hospital.  Mild or worse dehydration can be treated at home. You may be asked to: ? Drink more fluids. ? Drink an oral rehydration solution (ORS). This drink helps get the right amounts of fluids and salts and minerals in the blood (electrolytes).  Very bad dehydration can be treated: ? With fluids through an IV tube. ? By getting normal levels of salts and minerals in your blood. This is often done by giving salts and minerals through a tube. The tube is passed through your nose and into your stomach. ? By treating the root cause. Follow these   instructions at home: Oral rehydration solution If told by your doctor, drink an ORS:  Make an ORS. Use instructions on the package.  Start by drinking small amounts, about  cup (120 mL) every 5-10 minutes.  Slowly drink more until you have had the amount that your doctor said to have. Eating and drinking  Drink enough clear fluid to keep your pee pale yellow. If you were told to drink an ORS, finish the ORS first. Then, start slowly drinking other clear fluids. Drink fluids such as: ? Water. Do not drink only water. Doing that can make the salt (sodium) level in your body get too  low. ? Water from ice chips you suck on. ? Fruit juice that you have added water to (diluted). ? Low-calorie sports drinks.  Eat foods that have the right amounts of salts and minerals, such as: ? Bananas. ? Oranges. ? Potatoes. ? Tomatoes. ? Spinach.  Do not drink alcohol.  Avoid: ? Drinks that have a lot of sugar. These include:  High-calorie sports drinks.  Fruit juice that you did not add water to.  Soda.  Caffeine. ? Foods that are greasy or have a lot of fat or sugar.         General instructions  Take over-the-counter and prescription medicines only as told by your doctor.  Do not take salt tablets. Doing that can make the salt level in your body get too high.  Return to your normal activities as told by your doctor. Ask your doctor what activities are safe for you.  Keep all follow-up visits as told by your doctor. This is important. Contact a doctor if:  You have pain in your belly (abdomen) and the pain: ? Gets worse. ? Stays in one place.  You have a rash.  You have a stiff neck.  You get angry or annoyed (irritable) more easily than normal.  You are more tired or have a harder time waking than normal.  You feel: ? Weak or dizzy. ? Very thirsty. Get help right away if you have:  Any symptoms of very bad dehydration.  Symptoms of vomiting, such as: ? You cannot eat or drink without vomiting. ? Your vomiting gets worse or does not go away. ? Your vomit has blood or green stuff in it.  Symptoms that get worse with treatment.  A fever.  A very bad headache.  Problems with peeing or pooping (having a bowel movement), such as: ? Watery poop that gets worse or does not go away. ? Blood in your poop (stool). This may cause poop to look black and tarry. ? Not peeing in 6-8 hours. ? Peeing only a small amount of very dark pee in 6-8 hours.  Trouble breathing. These symptoms may be an emergency. Do not wait to see if the symptoms will go  away. Get medical help right away. Call your local emergency services (911 in the [place] Do not drive yourself to the hospital. Summary  Dehydration is a condition in which there is not enough water or other fluids in the body. This happens when a person loses more fluids than he or she takes in.  Treatment for this condition depends on how bad it is. Treatment should be started right away. Do not wait until your condition gets very bad.  Drink enough clear fluid to keep your pee pale yellow. If you were told to drink an oral rehydration solution (ORS), finish the ORS first. Then, start slowly drinking other   clear fluids.  Take over-the-counter and prescription medicines only as told by your doctor.  Get help right away if you have any symptoms of very bad dehydration. This information is not intended to replace advice given to you by your health care provider. Make sure you discuss any questions you have with your health care provider. Document Revised: 12/16/2018 Document Reviewed: 12/16/2018 Elsevier Patient Education  2021 [employer] Pegfilgrastim injection What is this medicine? PEGFILGRASTIM (PEG fil gra stim) is a long-acting granulocyte colony-stimulating factor that stimulates the growth of neutrophils, a type of white blood cell important in the body's fight against infection. It is used to reduce the incidence of fever and infection in patients with certain types of cancer who are receiving chemotherapy that affects the bone marrow, and to increase survival after being exposed to high doses of radiation. This medicine may be used for other purposes; ask your health care provider or pharmacist if you have questions. COMMON BRAND NAME(S): Fulphila, Neulasta, Nyvepria, UDENYCA, Ziextenzo What should I tell my health care provider before I take this medicine? They need to know if you have any of these conditions:  kidney disease  latex allergy  ongoing radiation  therapy  sickle cell disease  skin reactions to acrylic adhesives (On-Body Injector only)  an unusual or allergic reaction to pegfilgrastim, filgrastim, other medicines, foods, dyes, or preservatives  pregnant or trying to get pregnant  breast-feeding How should I use this medicine? This medicine is for injection under the skin. If you get this medicine at home, you will be taught how to prepare and give the pre-filled syringe or how to use the On-body Injector. Refer to the patient Instructions for Use for detailed instructions. Use exactly as directed. Tell your healthcare provider immediately if you suspect that the On-body Injector may not have performed as intended or if you suspect the use of the On-body Injector resulted in a missed or partial dose. It is important that you put your used needles and syringes in a special sharps container. Do not put them in a trash can. If you do not have a sharps container, call your pharmacist or healthcare provider to get one. Talk to your pediatrician regarding the use of this medicine in children. While this drug may be prescribed for selected conditions, precautions do apply. Overdosage: If you think you have taken too much of this medicine contact a poison control center or emergency room at once. NOTE: This medicine is only for you. Do not share this medicine with others. What if I miss a dose? It is important not to miss your dose. Call your doctor or health care professional if you miss your dose. If you miss a dose due to an On-body Injector failure or leakage, a new dose should be administered as soon as possible using a single prefilled syringe for manual use. What may interact with this medicine? Interactions have not been studied. This list may not describe all possible interactions. Give your health care provider a list of all the medicines, herbs, non-prescription drugs, or dietary supplements you use. Also tell them if you smoke, drink  alcohol, or use illegal drugs. Some items may interact with your medicine. What should I watch for while using this medicine? Your condition will be monitored carefully while you are receiving this medicine. You may need blood work done while you are taking this medicine. Talk to your health care provider about your risk of cancer. You may be more at risk for   certain types of cancer if you take this medicine. If you are going to need a MRI, CT scan, or other procedure, tell your doctor that you are using this medicine (On-Body Injector only). What side effects may I notice from receiving this medicine? Side effects that you should report to your doctor or health care professional as soon as possible:  allergic reactions (skin rash, itching or hives, swelling of the face, lips, or tongue)  back pain  dizziness  fever  pain, redness, or irritation at site where injected  pinpoint red spots on the skin  red or dark-brown urine  shortness of breath or breathing problems  stomach or side pain, or pain at the shoulder  swelling  tiredness  trouble passing urine or change in the amount of urine  unusual bruising or bleeding Side effects that usually do not require medical attention (report to your doctor or health care professional if they continue or are bothersome):  bone pain  muscle pain This list may not describe all possible side effects. Call your doctor for medical advice about side effects. You may report side effects to FDA at 1-800-FDA-1088. Where should I keep my medicine? Keep out of the reach of children. If you are using this medicine at home, you will be instructed on how to store it. Throw away any unused medicine after the expiration date on the label. NOTE: This sheet is a summary. It may not cover all possible information. If you have questions about this medicine, talk to your doctor, pharmacist, or health care provider.  2021 [employer] Standard  (2019-05-27 13:20:51)  

## 2020-10-22 ENCOUNTER — Other Ambulatory Visit: Payer: Self-pay

## 2020-10-22 ENCOUNTER — Encounter: Payer: Self-pay | Admitting: Oncology

## 2020-10-22 ENCOUNTER — Inpatient Hospital Stay: Payer: Medicare Other

## 2020-10-22 VITALS — BP 101/69 | HR 91 | Temp 98.2°F | Resp 18 | Ht 62.0 in | Wt 122.0 lb

## 2020-10-22 DIAGNOSIS — R110 Nausea: Secondary | ICD-10-CM

## 2020-10-22 DIAGNOSIS — T451X5A Adverse effect of antineoplastic and immunosuppressive drugs, initial encounter: Secondary | ICD-10-CM

## 2020-10-22 DIAGNOSIS — Z5112 Encounter for antineoplastic immunotherapy: Secondary | ICD-10-CM | POA: Diagnosis not present

## 2020-10-22 DIAGNOSIS — C50912 Malignant neoplasm of unspecified site of left female breast: Secondary | ICD-10-CM

## 2020-10-22 MED ORDER — ONDANSETRON HCL 4 MG/2ML IJ SOLN
INTRAMUSCULAR | Status: AC
  Filled 2020-10-22: qty 4

## 2020-10-22 MED ORDER — SODIUM CHLORIDE 0.9 % IV SOLN
Freq: Once | INTRAVENOUS | Status: AC
  Filled 2020-10-22: qty 250

## 2020-10-22 MED ORDER — LORAZEPAM 2 MG/ML IJ SOLN
1.0000 mg | Freq: Once | INTRAMUSCULAR | Status: AC
  Administered 2020-10-22: 1 mg via INTRAVENOUS

## 2020-10-22 MED ORDER — PROCHLORPERAZINE EDISYLATE 10 MG/2ML IJ SOLN: 10.0000 mg | Freq: Once | INTRAMUSCULAR | Status: DC | PRN

## 2020-10-22 MED ORDER — LORAZEPAM 2 MG/ML IJ SOLN
INTRAMUSCULAR | Status: AC
  Filled 2020-10-22: qty 1

## 2020-10-22 MED ORDER — ONDANSETRON HCL 4 MG/2ML IJ SOLN
8.0000 mg | Freq: Once | INTRAMUSCULAR | Status: AC
  Administered 2020-10-22: 8 mg via INTRAVENOUS

## 2020-10-22 MED ORDER — SODIUM CHLORIDE 0.9 % IV SOLN: 8.0000 mg | Freq: Once | INTRAVENOUS | Status: DC | PRN

## 2020-10-22 MED ORDER — HEPARIN SOD (PORK) LOCK FLUSH 100 UNIT/ML IV SOLN
500.0000 [IU] | Freq: Once | INTRAVENOUS | Status: AC | PRN
  Administered 2020-10-22: 500 [IU]
  Filled 2020-10-22: qty 5

## 2020-10-22 NOTE — Patient Instructions (Signed)
Dehydration, Adult Dehydration is condition in which there is not enough water or other fluids in the body. This happens when a person loses more fluids than he or she takes in. Important body parts cannot work right without the right amount of fluids. Any loss of fluids from the body can cause dehydration. Dehydration can be mild, worse, or very bad. It should be treated right away to keep it from getting very bad. What are the causes? This condition may be caused by:  Conditions that cause loss of water or other fluids, such as: ? Watery poop (diarrhea). ? Vomiting. ? Sweating a lot. ? Peeing (urinating) a lot.  Not drinking enough fluids, especially when you: ? Are ill. ? Are doing things that take a lot of energy to do.  Other illnesses and conditions, such as fever or infection.  Certain medicines, such as medicines that take extra fluid out of the body (diuretics).  Lack of safe drinking water.  Not being able to get enough water and food. What increases the risk? The following factors may make you more likely to develop this condition:  Having a long-term (chronic) illness that has not been treated the right way, such as: ? Diabetes. ? Heart disease. ? Kidney disease.  Being 65 years of age or older.  Having a disability.  Living in a place that is high above the ground or sea (high in altitude). The thinner, dried air causes more fluid loss.  Doing exercises that put stress on your body for a long time. What are the signs or symptoms? Symptoms of dehydration depend on how bad it is. Mild or worse dehydration  Thirst.  Dry lips or dry mouth.  Feeling dizzy or light-headed, especially when you stand up from sitting.  Muscle cramps.  Your body making: ? Dark pee (urine). Pee may be the color of tea. ? Less pee than normal. ? Less tears than normal.  Headache. Very bad dehydration  Changes in skin. Skin may: ? Be cold to the touch (clammy). ? Be blotchy  or pale. ? Not go back to normal right after you lightly pinch it and let it go.  Little or no tears, pee, or sweat.  Changes in vital signs, such as: ? Fast breathing. ? Low blood pressure. ? Weak pulse. ? Pulse that is more than 100 beats a minute when you are sitting still.  Other changes, such as: ? Feeling very thirsty. ? Eyes that look hollow (sunken). ? Cold hands and feet. ? Being mixed up (confused). ? Being very tired (lethargic) or having trouble waking from sleep. ? Short-term weight loss. ? Loss of consciousness. How is this treated? Treatment for this condition depends on how bad it is. Treatment should start right away. Do not wait until your condition gets very bad. Very bad dehydration is an emergency. You will need to go to a hospital.  Mild or worse dehydration can be treated at home. You may be asked to: ? Drink more fluids. ? Drink an oral rehydration solution (ORS). This drink helps get the right amounts of fluids and salts and minerals in the blood (electrolytes).  Very bad dehydration can be treated: ? With fluids through an IV tube. ? By getting normal levels of salts and minerals in your blood. This is often done by giving salts and minerals through a tube. The tube is passed through your nose and into your stomach. ? By treating the root cause. Follow these instructions at   home: Oral rehydration solution If told by your doctor, drink an ORS:  Make an ORS. Use instructions on the package.  Start by drinking small amounts, about  cup (120 mL) every 5-10 minutes.  Slowly drink more until you have had the amount that your doctor said to have. Eating and drinking  Drink enough clear fluid to keep your pee pale yellow. If you were told to drink an ORS, finish the ORS first. Then, start slowly drinking other clear fluids. Drink fluids such as: ? Water. Do not drink only water. Doing that can make the salt (sodium) level in your body get too low. ? Water  from ice chips you suck on. ? Fruit juice that you have added water to (diluted). ? Low-calorie sports drinks.  Eat foods that have the right amounts of salts and minerals, such as: ? Bananas. ? Oranges. ? Potatoes. ? Tomatoes. ? Spinach.  Do not drink alcohol.  Avoid: ? Drinks that have a lot of sugar. These include:  High-calorie sports drinks.  Fruit juice that you did not add water to.  Soda.  Caffeine. ? Foods that are greasy or have a lot of fat or sugar.         General instructions  Take over-the-counter and prescription medicines only as told by your doctor.  Do not take salt tablets. Doing that can make the salt level in your body get too high.  Return to your normal activities as told by your doctor. Ask your doctor what activities are safe for you.  Keep all follow-up visits as told by your doctor. This is important. Contact a doctor if:  You have pain in your belly (abdomen) and the pain: ? Gets worse. ? Stays in one place.  You have a rash.  You have a stiff neck.  You get angry or annoyed (irritable) more easily than normal.  You are more tired or have a harder time waking than normal.  You feel: ? Weak or dizzy. ? Very thirsty. Get help right away if you have:  Any symptoms of very bad dehydration.  Symptoms of vomiting, such as: ? You cannot eat or drink without vomiting. ? Your vomiting gets worse or does not go away. ? Your vomit has blood or green stuff in it.  Symptoms that get worse with treatment.  A fever.  A very bad headache.  Problems with peeing or pooping (having a bowel movement), such as: ? Watery poop that gets worse or does not go away. ? Blood in your poop (stool). This may cause poop to look black and tarry. ? Not peeing in 6-8 hours. ? Peeing only a small amount of very dark pee in 6-8 hours.  Trouble breathing. These symptoms may be an emergency. Do not wait to see if the symptoms will go away. Get  medical help right away. Call your local emergency services (911 in the [place] Do not drive yourself to the hospital. Summary  Dehydration is a condition in which there is not enough water or other fluids in the body. This happens when a person loses more fluids than he or she takes in.  Treatment for this condition depends on how bad it is. Treatment should be started right away. Do not wait until your condition gets very bad.  Drink enough clear fluid to keep your pee pale yellow. If you were told to drink an oral rehydration solution (ORS), finish the ORS first. Then, start slowly drinking other clear fluids.    Take over-the-counter and prescription medicines only as told by your doctor.  Get help right away if you have any symptoms of very bad dehydration. This information is not intended to replace advice given to you by your health care provider. Make sure you discuss any questions you have with your health care provider. Document Revised: 12/16/2018 Document Reviewed: 12/16/2018 [employer] Patient Education  2021 [employer]  

## 2020-10-23 ENCOUNTER — Telehealth: Payer: Self-pay

## 2020-10-23 NOTE — Telephone Encounter (Signed)
I notified Estill Dooms, pharmacist. They have chair open @ 1pm tomorrow. I called pt & told her to report to [facility] @ 1pm. Pt appreciative.

## 2020-10-24 ENCOUNTER — Inpatient Hospital Stay: Payer: Medicare Other

## 2020-10-24 ENCOUNTER — Encounter: Payer: Self-pay | Admitting: Hematology and Oncology

## 2020-10-24 ENCOUNTER — Other Ambulatory Visit: Payer: Self-pay

## 2020-10-24 VITALS — BP 100/59 | HR 88 | Temp 98.0°F | Resp 18 | Ht 62.0 in | Wt 117.5 lb

## 2020-10-24 DIAGNOSIS — E860 Dehydration: Secondary | ICD-10-CM

## 2020-10-24 DIAGNOSIS — R110 Nausea: Secondary | ICD-10-CM | POA: Insufficient documentation

## 2020-10-24 DIAGNOSIS — Z5112 Encounter for antineoplastic immunotherapy: Secondary | ICD-10-CM | POA: Diagnosis not present

## 2020-10-24 MED ORDER — SODIUM CHLORIDE 0.9% FLUSH
10.0000 mL | Freq: Once | INTRAVENOUS | Status: AC | PRN
  Administered 2020-10-24: 10 mL
  Filled 2020-10-24: qty 10

## 2020-10-24 MED ORDER — HEPARIN SOD (PORK) LOCK FLUSH 100 UNIT/ML IV SOLN
500.0000 [IU] | Freq: Once | INTRAVENOUS | Status: AC | PRN
  Administered 2020-10-24: 500 [IU]
  Filled 2020-10-24: qty 5

## 2020-10-24 MED ORDER — LORAZEPAM 2 MG/ML IJ SOLN
INTRAMUSCULAR | Status: AC
  Filled 2020-10-24: qty 1

## 2020-10-24 MED ORDER — ONDANSETRON HCL 4 MG/2ML IJ SOLN
INTRAMUSCULAR | Status: AC
  Filled 2020-10-24: qty 4

## 2020-10-24 MED ORDER — LORAZEPAM 2 MG/ML IJ SOLN
1.0000 mg | INTRAMUSCULAR | Status: AC | PRN
  Administered 2020-10-24: 1 mg via INTRAVENOUS

## 2020-10-24 MED ORDER — ONDANSETRON HCL 4 MG/2ML IJ SOLN
8.0000 mg | INTRAMUSCULAR | Status: AC | PRN
  Administered 2020-10-24: 8 mg via INTRAVENOUS

## 2020-10-24 MED ORDER — SODIUM CHLORIDE 0.9 % IV SOLN
Freq: Once | INTRAVENOUS | Status: AC
  Filled 2020-10-24: qty 250

## 2020-10-24 NOTE — Patient Instructions (Signed)
Dehydration, Adult Dehydration is condition in which there is not enough water or other fluids in the body. This happens when a person loses more fluids than he or she takes in. Important body parts cannot work right without the right amount of fluids. Any loss of fluids from the body can cause dehydration. Dehydration can be mild, worse, or very bad. It should be treated right away to keep it from getting very bad. What are the causes? This condition may be caused by:  Conditions that cause loss of water or other fluids, such as: ? Watery poop (diarrhea). ? Vomiting. ? Sweating a lot. ? Peeing (urinating) a lot.  Not drinking enough fluids, especially when you: ? Are ill. ? Are doing things that take a lot of energy to do.  Other illnesses and conditions, such as fever or infection.  Certain medicines, such as medicines that take extra fluid out of the body (diuretics).  Lack of safe drinking water.  Not being able to get enough water and food. What increases the risk? The following factors may make you more likely to develop this condition:  Having a long-term (chronic) illness that has not been treated the right way, such as: ? Diabetes. ? Heart disease. ? Kidney disease.  Being 65 years of age or older.  Having a disability.  Living in a place that is high above the ground or sea (high in altitude). The thinner, dried air causes more fluid loss.  Doing exercises that put stress on your body for a long time. What are the signs or symptoms? Symptoms of dehydration depend on how bad it is. Mild or worse dehydration  Thirst.  Dry lips or dry mouth.  Feeling dizzy or light-headed, especially when you stand up from sitting.  Muscle cramps.  Your body making: ? Dark pee (urine). Pee may be the color of tea. ? Less pee than normal. ? Less tears than normal.  Headache. Very bad dehydration  Changes in skin. Skin may: ? Be cold to the touch (clammy). ? Be blotchy  or pale. ? Not go back to normal right after you lightly pinch it and let it go.  Little or no tears, pee, or sweat.  Changes in vital signs, such as: ? Fast breathing. ? Low blood pressure. ? Weak pulse. ? Pulse that is more than 100 beats a minute when you are sitting still.  Other changes, such as: ? Feeling very thirsty. ? Eyes that look hollow (sunken). ? Cold hands and feet. ? Being mixed up (confused). ? Being very tired (lethargic) or having trouble waking from sleep. ? Short-term weight loss. ? Loss of consciousness. How is this treated? Treatment for this condition depends on how bad it is. Treatment should start right away. Do not wait until your condition gets very bad. Very bad dehydration is an emergency. You will need to go to a hospital.  Mild or worse dehydration can be treated at home. You may be asked to: ? Drink more fluids. ? Drink an oral rehydration solution (ORS). This drink helps get the right amounts of fluids and salts and minerals in the blood (electrolytes).  Very bad dehydration can be treated: ? With fluids through an IV tube. ? By getting normal levels of salts and minerals in your blood. This is often done by giving salts and minerals through a tube. The tube is passed through your nose and into your stomach. ? By treating the root cause. Follow these instructions at   home: Oral rehydration solution If told by your doctor, drink an ORS:  Make an ORS. Use instructions on the package.  Start by drinking small amounts, about  cup (120 mL) every 5-10 minutes.  Slowly drink more until you have had the amount that your doctor said to have. Eating and drinking  Drink enough clear fluid to keep your pee pale yellow. If you were told to drink an ORS, finish the ORS first. Then, start slowly drinking other clear fluids. Drink fluids such as: ? Water. Do not drink only water. Doing that can make the salt (sodium) level in your body get too low. ? Water  from ice chips you suck on. ? Fruit juice that you have added water to (diluted). ? Low-calorie sports drinks.  Eat foods that have the right amounts of salts and minerals, such as: ? Bananas. ? Oranges. ? Potatoes. ? Tomatoes. ? Spinach.  Do not drink alcohol.  Avoid: ? Drinks that have a lot of sugar. These include:  High-calorie sports drinks.  Fruit juice that you did not add water to.  Soda.  Caffeine. ? Foods that are greasy or have a lot of fat or sugar.         General instructions  Take over-the-counter and prescription medicines only as told by your doctor.  Do not take salt tablets. Doing that can make the salt level in your body get too high.  Return to your normal activities as told by your doctor. Ask your doctor what activities are safe for you.  Keep all follow-up visits as told by your doctor. This is important. Contact a doctor if:  You have pain in your belly (abdomen) and the pain: ? Gets worse. ? Stays in one place.  You have a rash.  You have a stiff neck.  You get angry or annoyed (irritable) more easily than normal.  You are more tired or have a harder time waking than normal.  You feel: ? Weak or dizzy. ? Very thirsty. Get help right away if you have:  Any symptoms of very bad dehydration.  Symptoms of vomiting, such as: ? You cannot eat or drink without vomiting. ? Your vomiting gets worse or does not go away. ? Your vomit has blood or green stuff in it.  Symptoms that get worse with treatment.  A fever.  A very bad headache.  Problems with peeing or pooping (having a bowel movement), such as: ? Watery poop that gets worse or does not go away. ? Blood in your poop (stool). This may cause poop to look black and tarry. ? Not peeing in 6-8 hours. ? Peeing only a small amount of very dark pee in 6-8 hours.  Trouble breathing. These symptoms may be an emergency. Do not wait to see if the symptoms will go away. Get  medical help right away. Call your local emergency services (911 in the [place] Do not drive yourself to the hospital. Summary  Dehydration is a condition in which there is not enough water or other fluids in the body. This happens when a person loses more fluids than he or she takes in.  Treatment for this condition depends on how bad it is. Treatment should be started right away. Do not wait until your condition gets very bad.  Drink enough clear fluid to keep your pee pale yellow. If you were told to drink an oral rehydration solution (ORS), finish the ORS first. Then, start slowly drinking other clear fluids.    Take over-the-counter and prescription medicines only as told by your doctor.  Get help right away if you have any symptoms of very bad dehydration. This information is not intended to replace advice given to you by your health care provider. Make sure you discuss any questions you have with your health care provider. Document Revised: 12/16/2018 Document Reviewed: 12/16/2018 [employer] Patient Education  2021 [employer]  

## 2020-10-26 ENCOUNTER — Encounter: Payer: Self-pay | Admitting: Oncology

## 2020-10-26 ENCOUNTER — Other Ambulatory Visit: Payer: Self-pay

## 2020-10-26 ENCOUNTER — Inpatient Hospital Stay: Payer: Medicare Other

## 2020-10-26 VITALS — BP 98/58 | HR 85 | Temp 98.0°F | Resp 18 | Ht 62.0 in | Wt 118.2 lb

## 2020-10-26 DIAGNOSIS — Z5112 Encounter for antineoplastic immunotherapy: Secondary | ICD-10-CM | POA: Diagnosis not present

## 2020-10-26 DIAGNOSIS — E860 Dehydration: Secondary | ICD-10-CM

## 2020-10-26 DIAGNOSIS — R110 Nausea: Secondary | ICD-10-CM

## 2020-10-26 MED ORDER — HEPARIN SOD (PORK) LOCK FLUSH 100 UNIT/ML IV SOLN
500.0000 [IU] | Freq: Once | INTRAVENOUS | Status: AC | PRN
  Administered 2020-10-26: 500 [IU]
  Filled 2020-10-26: qty 5

## 2020-10-26 MED ORDER — ONDANSETRON HCL 4 MG/2ML IJ SOLN
8.0000 mg | INTRAMUSCULAR | Status: AC | PRN
  Administered 2020-10-26: 8 mg via INTRAVENOUS

## 2020-10-26 MED ORDER — SODIUM CHLORIDE 0.9 % IV SOLN
Freq: Once | INTRAVENOUS | Status: AC
  Filled 2020-10-26: qty 250

## 2020-10-26 MED ORDER — LORAZEPAM 2 MG/ML IJ SOLN
INTRAMUSCULAR | Status: AC
  Filled 2020-10-26: qty 1

## 2020-10-26 MED ORDER — ONDANSETRON HCL 4 MG/2ML IJ SOLN
INTRAMUSCULAR | Status: AC
  Filled 2020-10-26: qty 4

## 2020-10-26 MED ORDER — LORAZEPAM 2 MG/ML IJ SOLN
1.0000 mg | INTRAMUSCULAR | Status: AC | PRN
  Administered 2020-10-26: 1 mg via INTRAVENOUS

## 2020-10-26 NOTE — Patient Instructions (Signed)

## 2020-11-02 ENCOUNTER — Other Ambulatory Visit: Payer: Self-pay | Admitting: General Surgery

## 2020-11-02 ENCOUNTER — Other Ambulatory Visit (HOSPITAL_COMMUNITY): Payer: Self-pay | Admitting: General Surgery

## 2020-11-02 DIAGNOSIS — C50412 Malignant neoplasm of upper-outer quadrant of left female breast: Secondary | ICD-10-CM

## 2020-11-05 ENCOUNTER — Encounter: Payer: Self-pay | Admitting: Oncology

## 2020-11-05 NOTE — Progress Notes (Signed)
[name],  [name]  Clinic Day:  11/06/2020  Referring physician: Delphina Cahill, FNP   CHIEF COMPLAINT:  CC:  A 70 year old female with history of Hormone and HER2/neu receptor positive breast cancer here for 3 week evaluation  Current Treatment:   Neoadjuvant docetaxel/carboplatin/trastuzumab/pertuzumab (TCHP)  HISTORY OF PRESENT ILLNESS:  Tammy Fowler is a 70 y.o. 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 Excisional biopsy revealed a 2 cm, grade 2, infiltrating ductal carcinoma in the left lower quadrant.  Estrogen and progesterone receptors were positive and her 2 Neu negative.  Tumor was present at the margins, so she underwent re-excision and axillary dissection.  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  Four of sixteen lymph nodes were positive for metastasis.  All surgical margins were free of tumor involvement.  She received adjuvant chemotherapy with with dose dense Adriamycin and Cytoxan for 4 cycles, followed by dose dense Taxol for 4 cycles, follwed by adjuvant radiation of the left breast.  She was placed on anastrozole 1 mg daily in April 2005 and completed 10 years of therapy in April 2015.  She underwent left latissimus dorsi muscle reconstruction of the left breast and right breast reduction.  She underwent total abdominal hysterectomy and bilateral salpingo-oophorectomy prior to starting anastrozole.  She had excision of a benign mass from the left breast in 2009.  She underwent testing for BRCA mutations, including BART, in April 2013, which was negative, however, we did not have the results.  She has a history of osteoporosis, previously treated with zolendronic acid every 6 months, which was discontinued when she completed anastrozole.     We began seeing  her in July 2014, when she relocated to this area.  Bone density scan in December 2015 was normal.  Her personal and family history wass suggestive of a hereditary cancer syndrome, so we recommended additional genetic testing with the Myriad myRisk Update Panel test to look for a mutation in other genes known to increase the risk for breast cancer and as well as other cancers.  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  These results were reviewed with her in detail and she was scheduled for an breast MRI which did not reveal any evidence of malignancy.  She was unsure about undergoing prophylactic bilateral mastectomies.  She was seen by Dr. Coralie Keens who referred her to Dr. Henrietta Hoover regarding reconstructive surgery.  She was interested in reconstruction and the plastic surgeon that she saw discouraged her because of the increased risk of complications with surgery on previously irradiated tissue.  The patient so far has continued close surveillance with annual mammogram and breast MRI spaced out 6 months. Bone density in January 2019 was normal.  Breast MRI in August 2019 did not reveal any evidence of malignancy  She developed diabetes and had an MRI abdomen, which did not reveal any evidence of pancreatic cancer.  She  had colonoscopy in early 2020 by Dr. Marcell Anger in [place] and had 2 polyps removed. Repeat colonoscopy in 5 years was recommended. Bilateral screening mammogram in March 2020 and April 2021 did not reveal any evidence  of malignancy.   She was seen in January 2022 with a new palpable mass in the left breast.  Diagnostic left mammogram revealed a suspicious mass measuring 2.0 cm at 10 o'clock. Diagnostic right mammogram did not reveal any evidence of malignancy.  Ultrasound guided biopsy revealed poorly differentiated  carcinoma in a background of adipose tissue with fibrosis and necrosis, consistent with breast origin.  Estrogen receptor was 30% positive and progesterone receptor negative and HER2 receptor positive.  Ki67 was 25%.  However, there was no breast tissue in the sample, so this was felt to be more likely a recurrence rather than a new breast primary, even though it has been many years. Her previous cancer was HER2 negative and ER positive.    We recommended neoadjuvant chemotherapy/HER2 targeted therapy with docetaxel/carboplatin/trastuzumab/pertuzumab [facility]) which she started treatment with [facility] on February 15th.  She has experienced decreased appetite, taste changes, mouth sores, and diarrhea despite imodium. She was given a prescription for Lomotil to better control her diarrhea. MBX has been effective for her mouth sores  She also had heartburn for which omeprazole has been effective.  She received a 3rd cycle of TCHP on March 30th.  At that time she continued to report diarrhea, but was not alternating Lomotil and Imodium. She has continued to lose weight, as she has not been eating well due to taste changes and decreased appetite.  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 After her 4th cycle of TCHP she presented to clinic with uncontrolled nausea and vomiting, as well dizziness and blurry vision. MRI brain did not reveal any intracranial metastasis.  She was given IV fluids and placed on lorazepam 0.5 mg every 6 hours as needed for nausea, vomiting and anxiety.  She was also placed on dronabinol 5 mg at bedtime for nausea, vomiting and appetite.  At her visit on May 6th, her side effects were fairly well controlled and she had gained 5 lb  She proceeded with a 5th cycle of TCHP at 25% dose reduction on May 11th..  INTERVAL HISTORY:  Tammy Fowler is here for evaluation after completing 6 cycles of TCHP. She is feeling  well today with minimal residual side effects from chemotherapy. She does complain of episodes of dizziness.  She has seen her surgeon and has a surgery date scheduled for 12/11/2020. We will get one treatment of HER2 therapy in prior to surgery. This will be given this week. She denies fever, chills, nausea or vomiting. She denies shortness of breath, chest pain or cough. She denies issue with bowel or bladder. She has met with her surgeon and has a date for reconstructive surgery on July 26th. CBC and CMP are unremarkable today except  for a decreased hemoglobin 9.3. REVIEW OF SYSTEMS:  Review of Systems  Constitutional:  Negative for appetite change, chills, diaphoresis, fatigue, fever and unexpected weight change.  HENT:   Negative for hearing loss, lump/mass, mouth sores, nosebleeds, sore throat, tinnitus, trouble swallowing and voice change.   Eyes:  Negative for eye problems and icterus.  Respiratory:  Negative for chest tightness, cough, hemoptysis, shortness of breath and wheezing.   Cardiovascular:  Negative for chest pain, leg swelling and palpitations.  Gastrointestinal:  Negative for abdominal distention, abdominal pain, blood in stool, constipation, diarrhea, nausea, rectal pain and vomiting.  Endocrine: Negative for hot flashes.  Genitourinary:  Negative for bladder incontinence, difficulty urinating, dyspareunia, dysuria, frequency,  hematuria and nocturia.   Musculoskeletal:  Negative for arthralgias, back pain, flank pain, gait problem, myalgias, neck pain and neck stiffness.  Skin:  Negative for itching, rash and wound.  Neurological:  Positive for dizziness and numbness. Negative for extremity weakness, gait problem, headaches, light-headedness, seizures and speech difficulty.  Hematological:  Negative for adenopathy. Does not bruise/bleed easily.  Psychiatric/Behavioral:  Negative for confusion, decreased concentration, depression, sleep disturbance and suicidal ideas. The patient  is not nervous/anxious.     VITALS:  Blood pressure (!) 114/58, pulse 80, temperature 98.3 F (36.8 C), temperature source Oral, resp. rate 18, height _0  (1.575 m), weight 124 lb 6.4 oz (56.4 kg), SpO2 99 %.  Blood pressure sitting 115/56 and pulse sitting 100.  Wt Readings from Last 3 Encounters:  11/06/20 124 lb 6.4 oz (56.4 kg)  10/26/20 118 lb 4 oz (53.6 kg)  10/24/20 117 lb 8 oz (53.3 kg)    Body mass index is 22.75 kg/m.  Performance status (ECOG): 2 - Symptomatic, <50% confined to bed  PHYSICAL EXAM:  Physical Exam Constitutional:      General: She is not in acute distress.    Appearance: Normal appearance. She is normal weight. She is not ill-appearing, toxic-appearing or diaphoretic.  HENT:     Head: Normocephalic and atraumatic.     Right Ear: Tympanic membrane normal.     Left Ear: Tympanic membrane normal.     Nose: Nose normal. No congestion or rhinorrhea.     Mouth/Throat:     Mouth: Mucous membranes are moist.     Pharynx: Oropharynx is clear. No oropharyngeal exudate or posterior oropharyngeal erythema.  Eyes:     General: No scleral icterus.       Right eye: No discharge.        Left eye: No discharge.     Extraocular Movements: Extraocular movements intact.     Conjunctiva/sclera: Conjunctivae normal.     Pupils: Pupils are equal, round, and reactive to light.  Neck:     Vascular: No carotid bruit.  Cardiovascular:     Rate and Rhythm: Normal rate and regular rhythm.     Heart sounds: No murmur heard.   No friction rub. No gallop.  Pulmonary:     Effort: Pulmonary effort is normal. No respiratory distress.     Breath sounds: Normal breath sounds. No stridor. No wheezing, rhonchi or rales.  Chest:     Chest wall: No tenderness.  Abdominal:     General: Abdomen is flat. Bowel sounds are normal. There is no distension.     Palpations: There is no mass.     Tenderness: There is no abdominal tenderness. There is no right CVA tenderness, left CVA  tenderness, guarding or rebound.     Hernia: No hernia is present.  Musculoskeletal:        General: No swelling, tenderness, deformity or signs of injury. Normal range of motion.     Cervical back: Normal range of motion and neck supple. No rigidity or tenderness.     Right lower leg: No edema.     Left lower leg: No edema.  Lymphadenopathy:     Cervical: No cervical adenopathy.  Skin:    General: Skin is warm and dry.     Capillary Refill: Capillary refill takes less than 2 seconds.     Coloration: Skin is not jaundiced or pale.     Findings: No bruising, erythema, lesion or rash.  Neurological:  General: No focal deficit present.     Mental Status: She is alert and oriented to person, place, and time. Mental status is at baseline.     Cranial Nerves: No cranial nerve deficit.     Sensory: No sensory deficit.     Motor: No weakness.     Coordination: Coordination normal.     Gait: Gait normal.     Deep Tendon Reflexes: Reflexes normal.  Psychiatric:        Mood and Affect: Mood normal.        Behavior: Behavior normal.        Thought Content: Thought content normal.        Judgment: Judgment normal.   LABS:   CBC Latest Ref Rng & Units 11/06/2020 10/16/2020 10/03/2020  WBC - 5.0 4.7 7.8  Hemoglobin 12.0 - 16.0 9.3(A) 10.1(A) 10.6(A)  Hematocrit 36 - 46 28(A) 30(A) 33(A)  Platelets 150 - 399 283 203 258   CMP Latest Ref Rng & Units 11/06/2020 10/16/2020 10/03/2020  Glucose 70 - 99 mg/dL - - -  BUN 4 - _0 Creatinine 0.5 - 1.1 0.5 0.5 0.5  Sodium 137 - 147 141 138 135(A)  Potassium 3.4 - 5.3 3.9 3.5 4.2  Chloride 99 - 108 104 102 101  CO2 13 - 22 32(A) 32(A) 27(A)  Calcium 8.7 - 10.7 9.2 9.3 9.8  Alkaline Phos 25 - 125 70 79 127(A)  AST 13 - 35 25 32 27  ALT 7 - 35 _1 No results found for: CEA1 / No results found for: CEA1 No results found for: PSA1 No results found for: DGU440 No results found for: HKV425  No results found for: TOTALPROTELP,  ALBUMINELP, A1GS, A2GS, BETS, BETA2SER, GAMS, MSPIKE, SPEI No results found for: TIBC, FERRITIN, IRONPCTSAT No results found for: LDH  STUDIES:  No results found.    HISTORY:   Past Medical History:  Diagnosis Date   Cancer ([facility])    Depression    Diabetes mellitus without complication ([facility])    Hyperlipidemia    Hypomagnesemia 08/10/2020   Sleep apnea     Past Surgical History:  Procedure Laterality Date   ABDOMINAL HYSTERECTOMY     BREAST SURGERY Left    HYSTERECTOMY ABDOMINAL WITH SALPINGO-OOPHORECTOMY  07/2003   due to fibroids   PORTACATH PLACEMENT Right 06/26/2020   Procedure: INSERTION PORT-A-CATH;  Surgeon: [name], MD;  Location: [facility];  Service: General;  Laterality: Right;   TRIGGER FINGER RELEASE      Family History  Problem Relation Age of Onset   Breast cancer Mother 73   Breast cancer Maternal Aunt 25   Ovarian cancer Maternal Grandmother 15   Bone cancer Maternal Grandfather        60s   Cancer Maternal Aunt 55    Social History:  reports that she has never smoked. She has never used smokeless tobacco. She reports current alcohol use. No history on file for drug use.The patient is alone today.  Allergies:  Allergies  Allergen Reactions   Oxycodone Itching   Codeine Other (See Comments)    Other reaction(s): Other (See Comments) Unknown Unknown    Lisinopril     Other reaction(s): Cough (ALLERGY/intolerance)    Current Medications: Current Outpatient Medications  Medication Sig Dispense Refill   aspirin 81 MG chewable tablet Chew by mouth.     benzonatate (TESSALON) 100 MG capsule  calcium-vitamin D 250-100 MG-UNIT tablet Take by mouth.     celecoxib (CELEBREX) 100 MG capsule Take by mouth.     dexamethasone (DECADRON) 4 MG tablet Take 2 tablets (8 mg total) by mouth 2 (two) times daily. Start the day before Taxotere. Then take daily x 3 days after chemotherapy. (Patient taking differently: Take 4 mg by mouth  2 (two) times daily. Start the day before Taxotere. Then take daily x 3 days after chemotherapy.) 30 tablet 1   diphenoxylate-atropine (LOMOTIL) 2.5-0.025 MG tablet Take 2 tablets by mouth 4 (four) times daily as needed for diarrhea or loose stools. 60 tablet 3   Docusate Sodium (DSS) 100 MG CAPS Take by mouth.     dronabinol (MARINOL) 5 MG capsule Take 1 capsule (5 mg total) by mouth at bedtime. 30 capsule 2   DULoxetine (CYMBALTA) 60 MG capsule Take 1 capsule by mouth daily.     FLUoxetine (PROZAC) 40 MG capsule Take by mouth.     fluticasone (FLONASE) 50 MCG/ACT nasal spray      HYDROcodone-acetaminophen (NORCO) 7.5-325 MG tablet Take 1 tablet by mouth every 4 (four) hours as needed for moderate pain. 120 tablet 0   lidocaine-prilocaine (EMLA) cream Apply to affected area once 30 g 3   LORazepam (ATIVAN) 0.5 MG tablet Take 1 tablet (0.5 mg total) by mouth every 6 (six) hours as needed for anxiety. 90 tablet 0   losartan (COZAAR) 50 MG tablet daily.     magic mouthwash (nystatin, lidocaine, diphenhydrAMINE) suspension Take 5 mLs by mouth every 3 (three) hours as needed for mouth pain. Swish & Spit; 260m bottle 3 [facility] @@ 1305 per Murrel Freet P,NP     magnesium oxide (MAG-OX) 400 (241.3 Mg) MG tablet Take 1 tablet (400 mg total) by mouth 2 (two) times daily. 60 tablet 1   metFORMIN (GLUCOPHAGE) 500 MG tablet 2 (two) times daily.     Multiple Vitamins-Minerals (THERA-M) TABS Take by mouth.     ondansetron (ZOFRAN) 8 MG tablet Take 1 tablet (8 mg total) by mouth 2 (two) times daily as needed (Nausea or vomiting). Start on the third day after chemotherapy. 30 tablet 1   prochlorperazine (COMPAZINE) 10 MG tablet Take 1 tablet (10 mg total) by mouth every 6 (six) hours as needed (Nausea or vomiting). 30 tablet 1   rosuvastatin (CRESTOR) 40 MG tablet daily.     traMADol (ULTRAM) 50 MG tablet Take 1-2 tablets (50-100 mg total) by mouth every 6 (six) hours as needed. 60  tablet 0   vitamin B-12 (CYANOCOBALAMIN) 500 MCG tablet Take 500 mcg by mouth daily.     No current facility-administered medications for this visit.     ASSESSMENT & PLAN:   Assessment:  1.  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 This is felt to most likely represent a recurrence rather than a 2nd breast primary.  There really is no way to prove this and the slides from her prior cancer are no longer available. She is receiving neoadjuvant TCHP and has had much difficulty tolerating it.  After 4 cycles, the doses were reduced by 25%. She still is experiencing adverse effects, but they are better controlled with medications. She completed chemotherapy. She will receive one cycle of HER2 targeted therapy prior to reconstructive surgery scheduled for  July.     Plan:     She is scheduled for reconstructive surgery in July. We will get one cycle of HER2 directed therapy in prior to surgery. She will return after surgery.    She verbalizes understanding of and agreement to the plans discussed today. She knows to call the office should any new questions or concerns arise.    [name], NP

## 2020-11-06 ENCOUNTER — Inpatient Hospital Stay (INDEPENDENT_AMBULATORY_CARE_PROVIDER_SITE_OTHER): Payer: Medicare Other | Admitting: Hematology and Oncology

## 2020-11-06 ENCOUNTER — Encounter: Payer: Self-pay | Admitting: Hematology and Oncology

## 2020-11-06 ENCOUNTER — Other Ambulatory Visit: Payer: Self-pay

## 2020-11-06 ENCOUNTER — Inpatient Hospital Stay: Payer: Medicare Other

## 2020-11-06 ENCOUNTER — Telehealth: Payer: Self-pay | Admitting: Hematology and Oncology

## 2020-11-06 VITALS — BP 114/58 | HR 80 | Temp 98.3°F | Resp 18 | Ht 62.0 in | Wt 124.4 lb

## 2020-11-06 DIAGNOSIS — C50912 Malignant neoplasm of unspecified site of left female breast: Secondary | ICD-10-CM | POA: Diagnosis not present

## 2020-11-06 DIAGNOSIS — Z170 Estrogen receptor positive status [ER+]: Secondary | ICD-10-CM | POA: Diagnosis not present

## 2020-11-06 DIAGNOSIS — C50911 Malignant neoplasm of unspecified site of right female breast: Secondary | ICD-10-CM

## 2020-11-06 LAB — BASIC METABOLIC PANEL
BUN: 12 (ref 4–21)
CO2: 32 — AB (ref 13–22)
Chloride: 104 (ref 99–108)
Creatinine: 0.5 (ref 0.5–1.1)
Glucose: 97
Potassium: 3.9 (ref 3.4–5.3)
Sodium: 141 (ref 137–147)

## 2020-11-06 LAB — COMPREHENSIVE METABOLIC PANEL
Albumin: 3.8 (ref 3.5–5.0)
Calcium: 9.2 (ref 8.7–10.7)

## 2020-11-06 LAB — HEPATIC FUNCTION PANEL
ALT: 17 (ref 7–35)
AST: 25 (ref 13–35)
Alkaline Phosphatase: 70 (ref 25–125)
Bilirubin, Total: 0.4

## 2020-11-06 LAB — CBC AND DIFFERENTIAL
HCT: 28 — AB (ref 36–46)
Hemoglobin: 9.3 — AB (ref 12.0–16.0)
Neutrophils Absolute: 3.35
Platelets: 283 (ref 150–399)
WBC: 5

## 2020-11-06 LAB — CBC: RBC: 2.7 — AB (ref 3.87–5.11)

## 2020-11-06 NOTE — Telephone Encounter (Signed)
Per 6/21 los next appt scheduled and given to patient 

## 2020-11-07 ENCOUNTER — Other Ambulatory Visit: Payer: Self-pay | Admitting: Hematology and Oncology

## 2020-11-07 ENCOUNTER — Encounter: Payer: Self-pay | Admitting: Oncology

## 2020-11-07 ENCOUNTER — Other Ambulatory Visit: Payer: Self-pay | Admitting: Pharmacist

## 2020-11-07 ENCOUNTER — Encounter: Payer: Self-pay | Admitting: Hematology and Oncology

## 2020-11-07 MED ORDER — MECLIZINE HCL 25 MG PO TABS: 25.0000 mg | ORAL_TABLET | Freq: Three times a day (TID) | ORAL | 0 refills | Status: DC | PRN

## 2020-11-08 ENCOUNTER — Inpatient Hospital Stay: Payer: Medicare Other

## 2020-11-08 ENCOUNTER — Other Ambulatory Visit: Payer: Self-pay

## 2020-11-08 VITALS — BP 128/56 | HR 84 | Temp 98.3°F | Resp 16 | Wt 126.0 lb

## 2020-11-08 DIAGNOSIS — C50912 Malignant neoplasm of unspecified site of left female breast: Secondary | ICD-10-CM

## 2020-11-08 DIAGNOSIS — Z5112 Encounter for antineoplastic immunotherapy: Secondary | ICD-10-CM | POA: Diagnosis not present

## 2020-11-08 MED ORDER — DIPHENHYDRAMINE HCL 25 MG PO CAPS
25.0000 mg | ORAL_CAPSULE | Freq: Once | ORAL | Status: AC
  Administered 2020-11-08: 25 mg via ORAL

## 2020-11-08 MED ORDER — DIPHENHYDRAMINE HCL 25 MG PO CAPS
ORAL_CAPSULE | ORAL | Status: AC
  Filled 2020-11-08: qty 1

## 2020-11-08 MED ORDER — SODIUM CHLORIDE 0.9 % IV SOLN
Freq: Once | INTRAVENOUS | Status: AC
  Filled 2020-11-08: qty 250

## 2020-11-08 MED ORDER — ACETAMINOPHEN 325 MG PO TABS
ORAL_TABLET | ORAL | Status: AC
  Filled 2020-11-08: qty 2

## 2020-11-08 MED ORDER — PERTUZUMAB CHEMO INJECTION 420 MG/14ML
420.0000 mg | Freq: Once | INTRAVENOUS | Status: AC
  Administered 2020-11-08: 420 mg via INTRAVENOUS
  Filled 2020-11-08: qty 14

## 2020-11-08 MED ORDER — ACETAMINOPHEN 325 MG PO TABS
650.0000 mg | ORAL_TABLET | Freq: Once | ORAL | Status: AC
Start: 2020-11-08 — End: 2020-11-08
  Administered 2020-11-08: 650 mg via ORAL

## 2020-11-08 MED ORDER — HEPARIN SOD (PORK) LOCK FLUSH 100 UNIT/ML IV SOLN
500.0000 [IU] | Freq: Once | INTRAVENOUS | Status: AC | PRN
  Administered 2020-11-08: 500 [IU]
  Filled 2020-11-08: qty 5

## 2020-11-08 MED ORDER — TRASTUZUMAB-ANNS CHEMO 150 MG IV SOLR
6.0000 mg/kg | Freq: Once | INTRAVENOUS | Status: AC
  Administered 2020-11-08: 336 mg via INTRAVENOUS
  Filled 2020-11-08: qty 16

## 2020-11-08 NOTE — Patient Instructions (Signed)
[facility]  Discharge Instructions: Thank you for choosing [facility] to provide your oncology and hematology care.  If you have a lab appointment with the [facility], please go directly to the [facility] and check in at the registration area.   Wear comfortable clothing and clothing appropriate for easy access to any Portacath or PICC line.   We strive to give you quality time with your provider. You may need to reschedule your appointment if you arrive late (15 or more minutes).  Arriving late affects you and other patients whose appointments are after yours.  Also, if you miss three or more appointments without notifying the office, you may be dismissed from the clinic at the provider's discretion.      For prescription refill requests, have your pharmacy contact our office and allow 72 hours for refills to be completed.    Today you received the following chemotherapy and/or immunotherapy agents Trastuzumab,Pertuzumab   To help prevent nausea and vomiting after your treatment, we encourage you to take your nausea medication as directed.  BELOW ARE SYMPTOMS THAT SHOULD BE REPORTED IMMEDIATELY: *FEVER GREATER THAN 100.4 F (38 C) OR HIGHER *CHILLS OR SWEATING *NAUSEA AND VOMITING THAT IS NOT CONTROLLED WITH YOUR NAUSEA MEDICATION *UNUSUAL SHORTNESS OF BREATH *UNUSUAL BRUISING OR BLEEDING *URINARY PROBLEMS (pain or burning when urinating, or frequent urination) *BOWEL PROBLEMS (unusual diarrhea, constipation, pain near the anus) TENDERNESS IN MOUTH AND THROAT WITH OR WITHOUT PRESENCE OF ULCERS (sore throat, sores in mouth, or a toothache) UNUSUAL RASH, SWELLING OR PAIN  UNUSUAL VAGINAL DISCHARGE OR ITCHING   Items with * indicate a potential emergency and should be followed up as soon as possible or go to the Emergency Department if any problems should occur.  Please show the CHEMOTHERAPY ALERT CARD or IMMUNOTHERAPY ALERT CARD at check-in  to the Emergency Department and triage nurse.  Should you have questions after your visit or need to cancel or reschedule your appointment, please contact [facility]  Dept: [phone]  and follow the prompts.  Office hours are 8:00 a.m. to 4:30 p.m. Monday - Friday. Please note that voicemails left after 4:00 p.m. may not be returned until the following business day.  We are closed weekends and major holidays. You have access to a nurse at all times for urgent questions. Please call the main number to the clinic Dept: [phone] and follow the prompts.  For any non-urgent questions, you may also contact your provider using MyChart. We now offer e-Visits for anyone 68 and older to request care online for non-urgent symptoms. For details visit [email or web address].   Also download the MyChart app! Go to the app store, search "MyChart", open the app, select [facility], and log in with your MyChart username and password.  Due to Covid, a mask is required upon entering the hospital/clinic. If you do not have a mask, one will be given to you upon arrival. For doctor visits, patients may have 1 support person aged 68 or older with them. For treatment visits, patients cannot have anyone with them due to current Covid guidelines and our immunocompromised population.

## 2020-11-08 NOTE — Progress Notes (Signed)
1358: PT STABLE AT TIME OF DISCHARGE

## 2020-11-14 ENCOUNTER — Ambulatory Visit: Payer: Medicare Other | Admitting: Plastic Surgery

## 2020-11-15 ENCOUNTER — Other Ambulatory Visit: Payer: Self-pay

## 2020-11-15 ENCOUNTER — Ambulatory Visit (HOSPITAL_COMMUNITY)
Admission: RE | Admit: 2020-11-15 | Discharge: 2020-11-15 | Disposition: A | Discharge status: Home / Self Care | Payer: Medicare Other | Source: Ambulatory Visit | Attending: General Surgery | Admitting: General Surgery

## 2020-11-15 DIAGNOSIS — C50412 Malignant neoplasm of upper-outer quadrant of left female breast: Secondary | ICD-10-CM | POA: Diagnosis not present

## 2020-11-15 IMAGING — MR MR BREAST BILAT WO/W CM
8 of 11 series · 29 of 48 positions shown · IV contrast (6ml GADAVIST)
Comparison: Bilateral breast MRI [DATE].

CLINICAL DATA: Patient with history of poorly differentiated
carcinoma left breast. Patient status post chemotherapy.

EXAM:
BILATERAL BREAST MRI WITH AND WITHOUT CONTRAST
TECHNIQUE: Multiplanar, multisequence MR images of both breasts were obtained
prior to and following the intravenous administration of 6 ml of
Gadavist

[Series 2: T2 · axial · 3.0mm · 0.83mm/px · z∈[-92,+92]mm · 4 of 62 slices shown]
[im 1/62]
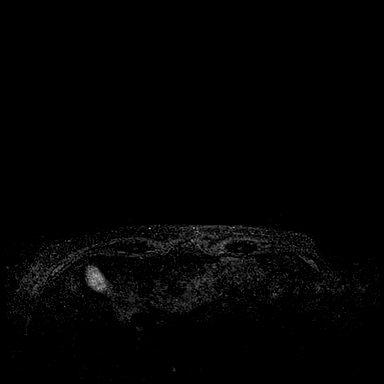
[im 21/62]
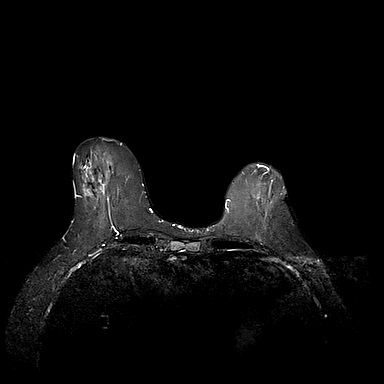
[im 41/62]
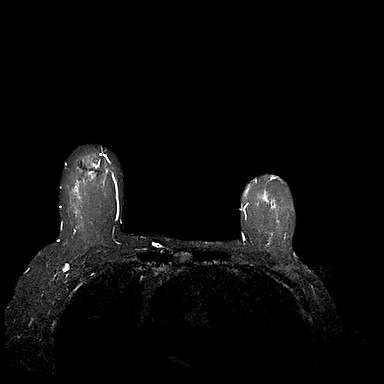
[im 62/62]
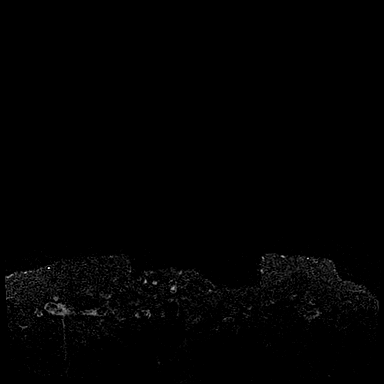

[Series 3: T1 fat-sat · axial · 1.2mm · 0.71mm/px · z∈[-86,+86]mm · 7 of 144 slices shown (1 of 4)]
[im 1/144]
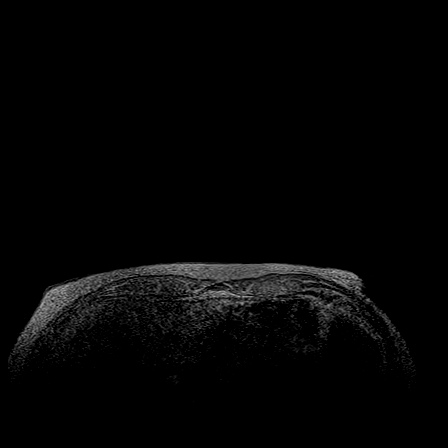
[im 24/144]
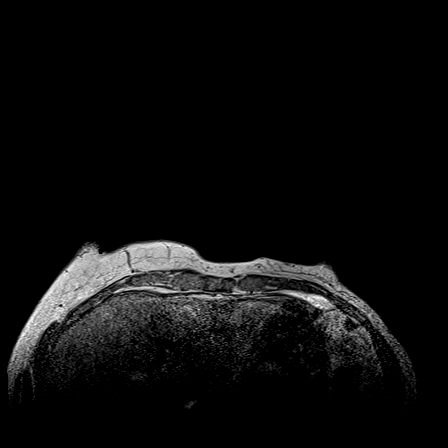
[im 48/144]
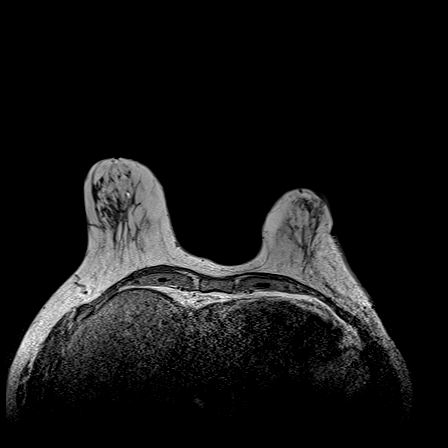
[im 72/144]
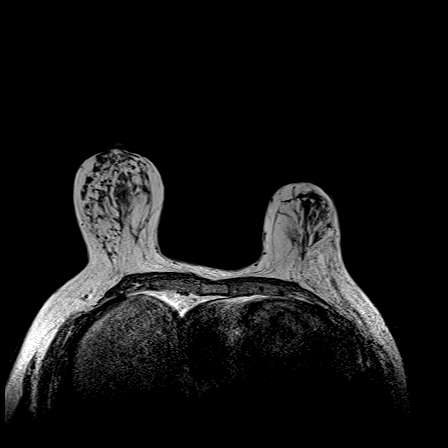
[im 96/144]
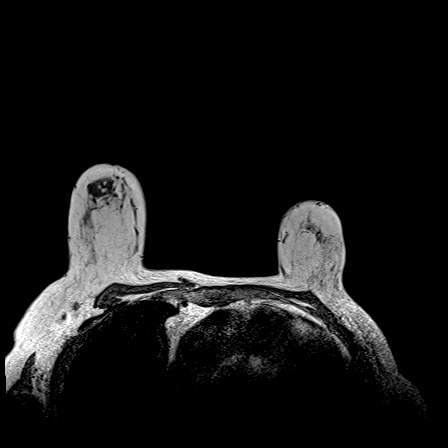
[im 120/144]
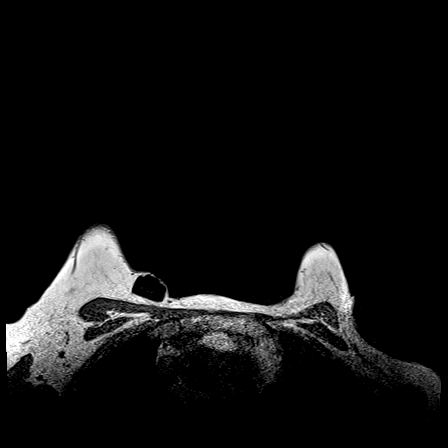
[im 144/144]
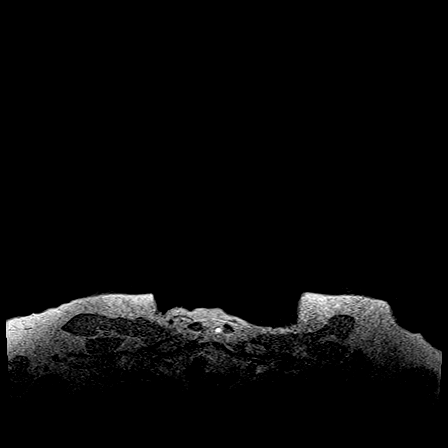

[Series 5: T1 fat-sat · axial · 1.6mm · 0.77mm/px · z∈[-89,+89]mm · 5 of 112 slices shown (2 of 4)]
[im 1/112]
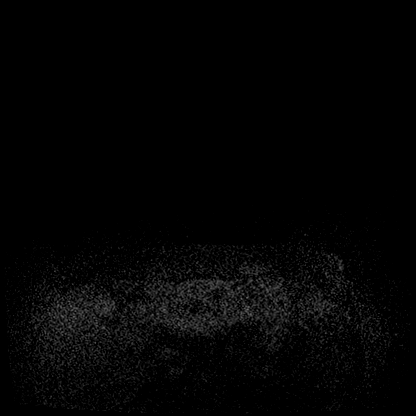
[im 28/112]
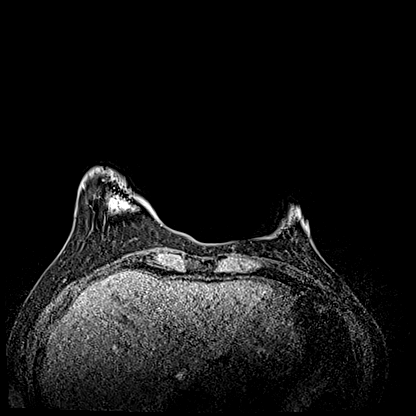
[im 56/112]
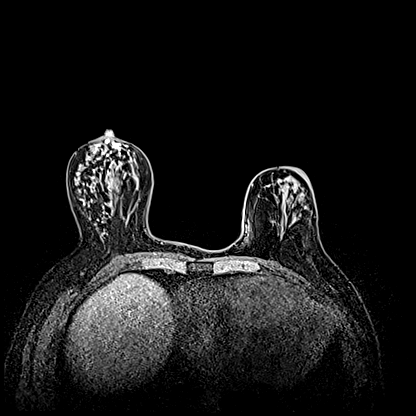
[im 84/112]
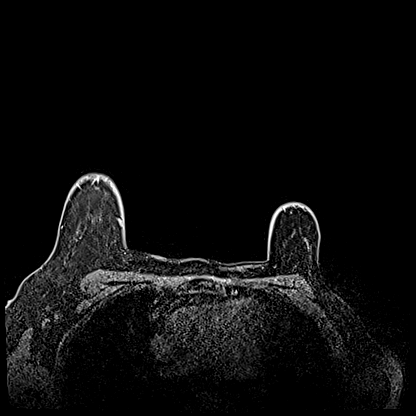
[im 112/112]
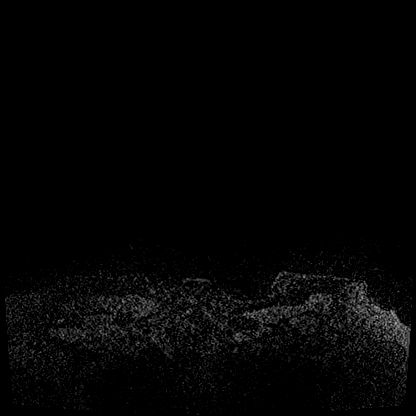

[Series 6: T1 fat-sat · axial · 1.6mm · 0.77mm/px · z∈[-89,+89]mm · 5 of 112 slices shown (3 of 4)]
[im 1/112]
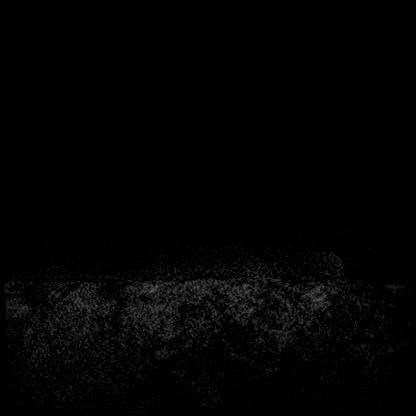
[im 28/112]
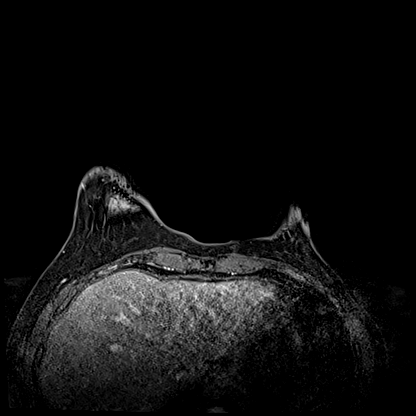
[im 56/112]
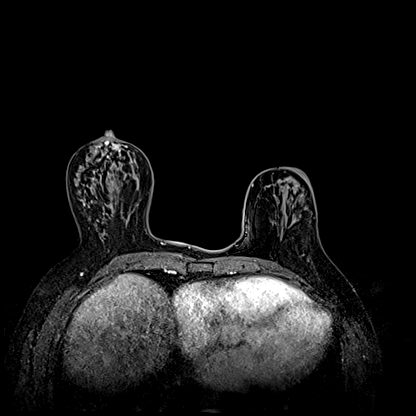
[im 84/112]
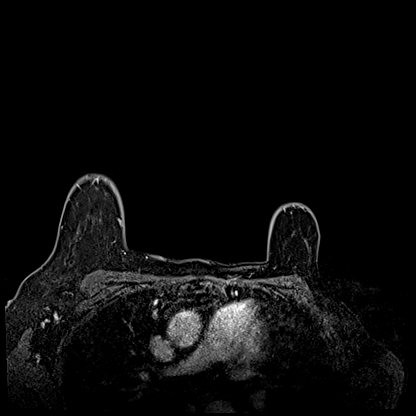
[im 112/112]
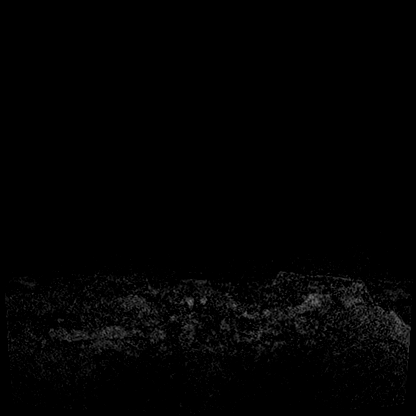

[Series 7: T1 · axial · 1.6mm · 0.77mm/px · z∈[-89,+89]mm · 5 of 112 slices shown (1 of 3)]
[im 1/112]
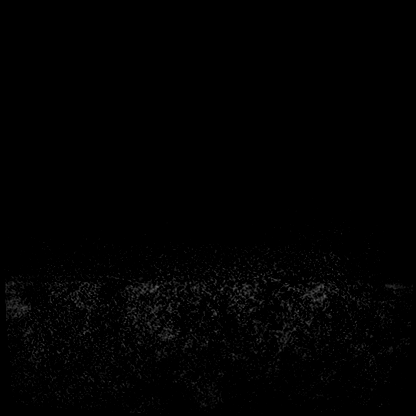
[im 28/112]
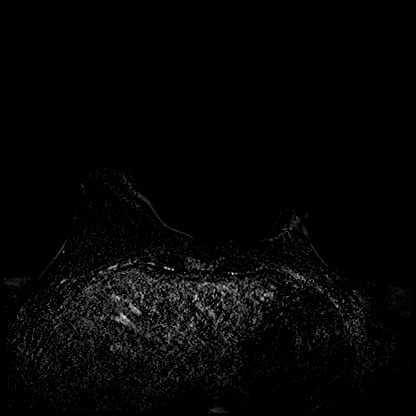
[im 56/112]
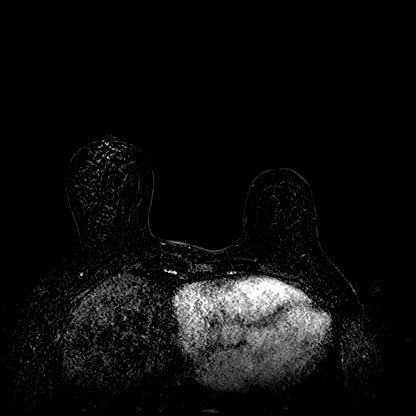
[im 84/112]
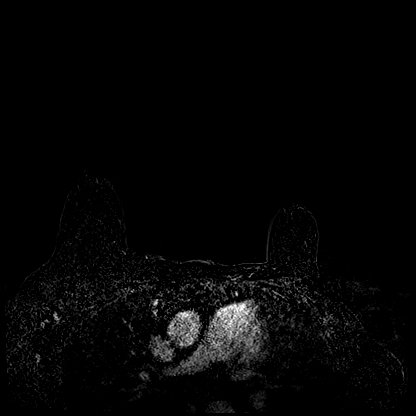
[im 112/112]
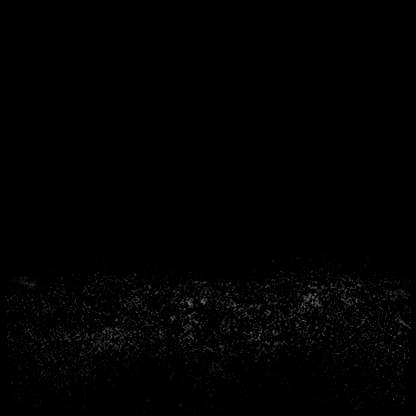

[Series 8: T1 · coronal · 320.0mm · 0.77mm/px · 1 of 3 slices shown (2 of 3)]
[im 1/3]
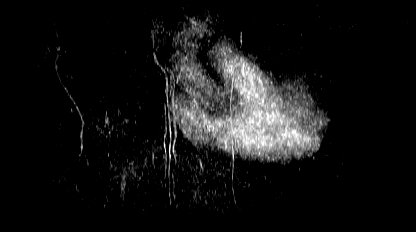

[Series 9: T1 · axial · 179.2mm · 0.77mm/px · 1 of 3 slices shown (3 of 3)]
[im 1/3]
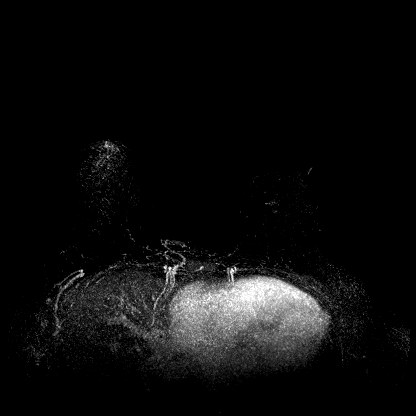

[Series 10: T1 fat-sat · axial · 1.6mm · 0.77mm/px · 1 of 112 slices shown (4 of 4)]
[im 1/112]
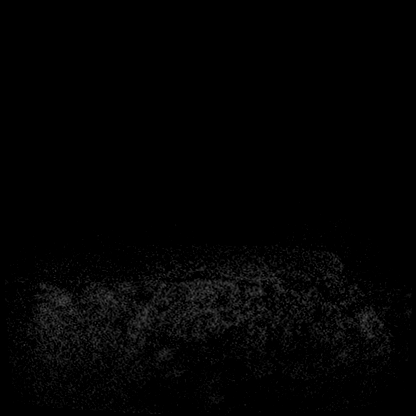

[29 of 48 positions shown; findings below may reference images not displayed]

Three-dimensional MR images were rendered by post-processing of the
original MR data on an independent workstation. The
three-dimensional MR images were interpreted, and findings are
reported in the following complete MRI report for this study. Three
dimensional images were evaluated at the independent interpreting
workstation using the DynaCAD thin client.
FINDINGS: Breast composition: c. Heterogeneous fibroglandular tissue.

Background parenchymal enhancement: Mild

Right breast: No mass or abnormal enhancement.

Left breast: Within the upper inner left breast anterior depth there
is a persistent 1.3 x 1.2 cm peripherally enhancing mass compatible
with biopsy-proven carcinoma. This is slightly decreased in size
from prior exam where it measured 1.7 x 1.6 cm. There is still mild
peripheral irregular enhancement surrounding the mass. No additional
suspicious findings within the left breast. Similar posterior
central lumpectomy changes.

Lymph nodes: No abnormal appearing lymph nodes.

Ancillary findings:  None.
IMPRESSION: Mild interval decrease in size of left breast mass which still
demonstrates peripheral irregular enhancement.

RECOMMENDATION:
Treatment plan for known left breast malignancy.

BI-RADS CATEGORY  6: Known biopsy-proven malignancy.

## 2020-11-15 MED ORDER — GADOBUTROL 1 MMOL/ML IV SOLN
6.0000 mL | Freq: Once | INTRAVENOUS | Status: AC | PRN
  Administered 2020-11-15: 6 mL via INTRAVENOUS

## 2020-11-17 ENCOUNTER — Encounter: Payer: Self-pay | Admitting: Hematology and Oncology

## 2020-11-17 ENCOUNTER — Encounter: Payer: Self-pay | Admitting: Oncology

## 2020-11-26 ENCOUNTER — Other Ambulatory Visit: Payer: Self-pay

## 2020-11-26 ENCOUNTER — Ambulatory Visit (INDEPENDENT_AMBULATORY_CARE_PROVIDER_SITE_OTHER): Payer: Medicare Other

## 2020-11-26 VITALS — BP 119/75 | HR 99 | Ht 62.0 in | Wt 127.6 lb

## 2020-11-26 DIAGNOSIS — C50912 Malignant neoplasm of unspecified site of left female breast: Secondary | ICD-10-CM

## 2020-11-26 DIAGNOSIS — Z170 Estrogen receptor positive status [ER+]: Secondary | ICD-10-CM | POA: Diagnosis not present

## 2020-11-27 ENCOUNTER — Encounter: Payer: Self-pay | Admitting: Hematology and Oncology

## 2020-11-27 ENCOUNTER — Encounter: Payer: Self-pay | Admitting: Oncology

## 2020-11-27 ENCOUNTER — Inpatient Hospital Stay: Payer: Medicare Other | Attending: Oncology | Admitting: Hematology and Oncology

## 2020-11-27 ENCOUNTER — Other Ambulatory Visit: Payer: Self-pay | Admitting: Hematology and Oncology

## 2020-11-27 ENCOUNTER — Inpatient Hospital Stay: Payer: Medicare Other

## 2020-11-27 VITALS — BP 126/63 | HR 113 | Temp 98.3°F | Resp 18 | Ht 62.0 in | Wt 127.8 lb

## 2020-11-27 DIAGNOSIS — Z170 Estrogen receptor positive status [ER+]: Secondary | ICD-10-CM

## 2020-11-27 DIAGNOSIS — C50912 Malignant neoplasm of unspecified site of left female breast: Secondary | ICD-10-CM | POA: Diagnosis not present

## 2020-11-27 DIAGNOSIS — C50911 Malignant neoplasm of unspecified site of right female breast: Secondary | ICD-10-CM

## 2020-11-27 LAB — BASIC METABOLIC PANEL
BUN: 19 (ref 4–21)
CO2: 30 — AB (ref 13–22)
Chloride: 101 (ref 99–108)
Creatinine: 0.5 (ref 0.5–1.1)
Glucose: 204
Potassium: 4.7 (ref 3.4–5.3)
Sodium: 138 (ref 137–147)

## 2020-11-27 LAB — COMPREHENSIVE METABOLIC PANEL
Albumin: 4.5 (ref 3.5–5.0)
Calcium: 9.5 (ref 8.7–10.7)

## 2020-11-27 LAB — HEPATIC FUNCTION PANEL
ALT: 21 (ref 7–35)
AST: 28 (ref 13–35)
Alkaline Phosphatase: 84 (ref 25–125)
Bilirubin, Total: 0.4

## 2020-11-27 LAB — CBC AND DIFFERENTIAL
HCT: 33 — AB (ref 36–46)
Hemoglobin: 10.8 — AB (ref 12.0–16.0)
Neutrophils Absolute: 3.11
Platelets: 276 (ref 150–399)
WBC: 4.5

## 2020-11-27 LAB — CBC
MCV: 104 — AB (ref 81–99)
RBC: 3.17 — AB (ref 3.87–5.11)

## 2020-11-27 MED ORDER — GABAPENTIN 300 MG PO CAPS: 300.0000 mg | ORAL_CAPSULE | Freq: Three times a day (TID) | ORAL | 0 refills | Status: DC

## 2020-11-27 NOTE — Progress Notes (Signed)
[facility]  [address] [place],  [place]  [address] [phone]  Clinic Day:  11/27/2020  Referring physician: Delphina Cahill, FNP   CHIEF COMPLAINT:  CC:  A 70 year old female with history of Hormone and HER2/neu receptor positive breast cancer here for 3 week evaluation  Current Treatment:   Neoadjuvant docetaxel/carboplatin/trastuzumab/pertuzumab (TCHP)  HISTORY OF PRESENT ILLNESS:  Tammy Fowler is a 70 y.o. 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 Excisional biopsy revealed a 2 cm, grade 2, infiltrating ductal carcinoma in the left lower quadrant.  Estrogen and progesterone receptors were positive and her 2 Neu negative.  Tumor was present at the margins, so she underwent re-excision and axillary dissection.  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  Four of sixteen lymph nodes were positive for metastasis.  All surgical margins were free of tumor involvement.  She received adjuvant chemotherapy with with dose dense Adriamycin and Cytoxan for 4 cycles, followed by dose dense Taxol for 4 cycles, follwed by adjuvant radiation of the left breast.  She was placed on anastrozole 1 mg daily in April 2005 and completed 10 years of therapy in April 2015.  She underwent left latissimus dorsi muscle reconstruction of the left breast and right breast reduction.  She underwent total abdominal hysterectomy and bilateral salpingo-oophorectomy prior to starting anastrozole.  She had excision of a benign mass from the left breast in 2009.  She underwent testing for BRCA mutations, including BART, in April 2013, which was negative, however, we did not have the results.  She has a history of osteoporosis, previously treated with zolendronic acid every 6 months, which was discontinued when she completed anastrozole.     We began seeing  her in July 2014, when she relocated to this area.  Bone density scan in December 2015 was normal.  Her personal and family history wass suggestive of a hereditary cancer syndrome, so we recommended additional genetic testing with the Myriad myRisk Update Panel test to look for a mutation in other genes known to increase the risk for breast cancer and as well as other cancers.  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  These results were reviewed with her in detail and she was scheduled for an breast MRI which did not reveal any evidence of malignancy.  She was unsure about undergoing prophylactic bilateral mastectomies.  She was seen by Dr. Coralie Keens who referred her to Dr. Henrietta Hoover regarding reconstructive surgery.  She was interested in reconstruction and the plastic surgeon that she saw discouraged her because of the increased risk of complications with surgery on previously irradiated tissue.  The patient so far has continued close surveillance with annual mammogram and breast MRI spaced out 6 months. Bone density in January 2019 was normal.  Breast MRI in August 2019 did not reveal any evidence of malignancy  She developed diabetes and had an MRI abdomen, which did not reveal any evidence of pancreatic cancer.  She  had colonoscopy in early 2020 by Dr. Marcell Anger in [place] and had 2 polyps removed. Repeat colonoscopy in 5 years was recommended. Bilateral screening mammogram in March 2020 and April 2021 did not reveal any evidence  of malignancy.   She was seen in January 2022 with a new palpable mass in the left breast.  Diagnostic left mammogram revealed a suspicious mass measuring 2.0 cm at 10 o'clock. Diagnostic right mammogram did not reveal any evidence of malignancy.  Ultrasound guided biopsy revealed poorly differentiated  carcinoma in a background of adipose tissue with fibrosis and necrosis, consistent with breast origin.  Estrogen receptor was 30% positive and progesterone receptor negative and HER2 receptor positive.  Ki67 was 25%.  However, there was no breast tissue in the sample, so this was felt to be more likely a recurrence rather than a new breast primary, even though it has been many years. Her previous cancer was HER2 negative and ER positive.    We recommended neoadjuvant chemotherapy/HER2 targeted therapy with docetaxel/carboplatin/trastuzumab/pertuzumab [facility]) which she started treatment with [facility] on February 15th.  She has experienced decreased appetite, taste changes, mouth sores, and diarrhea despite imodium. She was given a prescription for Lomotil to better control her diarrhea. MBX has been effective for her mouth sores  She also had heartburn for which omeprazole has been effective.  She received a 3rd cycle of TCHP on March 30th.  At that time she continued to report diarrhea, but was not alternating Lomotil and Imodium. She has continued to lose weight, as she has not been eating well due to taste changes and decreased appetite.  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 After her 4th cycle of TCHP she presented to clinic with uncontrolled nausea and vomiting, as well dizziness and blurry vision. MRI brain did not reveal any intracranial metastasis.  She was given IV fluids and placed on lorazepam 0.5 mg every 6 hours as needed for nausea, vomiting and anxiety.  She was also placed on dronabinol 5 mg at bedtime for nausea, vomiting and appetite.  At her visit on May 6th, her side effects were fairly well controlled and she had gained 5 lb  She proceeded with a 5th cycle of TCHP at 25% dose reduction on May 11th..  INTERVAL HISTORY:  Kaslyn is here for evaluation after completing 6 cycles of TCHP. She had her  first cycle of maintenance HER2 therapy and reports tolerating it poorly, even though she has had 6 previous cycles before. She reports severe pain in her knees and an itchy rash that involved both arms, her trunk, back and a couple of spots on her face. These spots have mainly resolved today other than one or two that have crusted over. She denies fever, chills, nausea or fever. She denies shortness of breath, cough or chest pain. She denies issue with bowel or bladder. She is scheduled for reconstructive surgery on 12/11/2020. CBC and CMP are unremarkable.  REVIEW OF SYSTEMS:  Review of Systems  Constitutional:  Negative for appetite change, chills, diaphoresis, fatigue, fever and unexpected weight change.  HENT:   Negative for hearing loss, lump/mass, mouth sores, nosebleeds, sore throat, tinnitus, trouble swallowing and voice change.   Eyes:  Negative for eye problems and icterus.  Respiratory:  Negative for chest tightness, cough, hemoptysis, shortness of breath and wheezing.   Cardiovascular:  Negative for chest pain, leg swelling and palpitations.  Gastrointestinal:  Negative for abdominal distention, abdominal pain, blood in stool, constipation, diarrhea, nausea, rectal pain and vomiting.  Endocrine: Negative for hot flashes.  Genitourinary:  Negative for bladder incontinence, difficulty urinating, dyspareunia, dysuria, frequency, hematuria and  nocturia.   Musculoskeletal:  Negative for arthralgias, back pain, flank pain, gait problem, myalgias, neck pain and neck stiffness.  Skin:  Positive for rash. Negative for itching and wound.  Neurological:  Negative for dizziness, extremity weakness, gait problem, headaches, light-headedness, numbness, seizures and speech difficulty.  Hematological:  Negative for adenopathy. Does not bruise/bleed easily.  Psychiatric/Behavioral:  Negative for confusion, decreased concentration, depression, sleep disturbance and suicidal ideas. The patient is not  nervous/anxious.     VITALS:  Blood pressure 126/63, pulse (!) 113, temperature 98.3 F (36.8 C), temperature source Oral, resp. rate 18, height 5' 2"  (1.575 m), weight 127 lb 12.8 oz (58 kg), SpO2 99 %.  Blood pressure sitting 115/56 and pulse sitting 100.  Wt Readings from Last 3 Encounters:  11/27/20 127 lb 12.8 oz (58 kg)  11/26/20 127 lb 9.6 oz (57.9 kg)  11/08/20 126 lb 0.6 oz (57.2 kg)    Body mass index is 23.37 kg/m.  Performance status (ECOG): 2 - Symptomatic, <50% confined to bed  PHYSICAL EXAM:  Physical Exam Constitutional:      General: She is not in acute distress.    Appearance: Normal appearance. She is normal weight. She is not ill-appearing, toxic-appearing or diaphoretic.  HENT:     Head: Normocephalic and atraumatic.     Right Ear: Tympanic membrane normal.     Left Ear: Tympanic membrane normal.     Nose: Nose normal. No congestion or rhinorrhea.     Mouth/Throat:     Mouth: Mucous membranes are moist.     Pharynx: Oropharynx is clear. No oropharyngeal exudate or posterior oropharyngeal erythema.  Eyes:     General: No scleral icterus.       Right eye: No discharge.        Left eye: No discharge.     Extraocular Movements: Extraocular movements intact.     Conjunctiva/sclera: Conjunctivae normal.     Pupils: Pupils are equal, round, and reactive to light.  Neck:     Vascular: No carotid bruit.  Cardiovascular:     Rate and Rhythm: Normal rate and regular rhythm.     Heart sounds: No murmur heard.   No friction rub. No gallop.  Pulmonary:     Effort: Pulmonary effort is normal. No respiratory distress.     Breath sounds: Normal breath sounds. No stridor. No wheezing, rhonchi or rales.  Chest:     Chest wall: No tenderness.  Abdominal:     General: Abdomen is flat. Bowel sounds are normal. There is no distension.     Palpations: There is no mass.     Tenderness: There is no abdominal tenderness. There is no right CVA tenderness, left CVA  tenderness, guarding or rebound.     Hernia: No hernia is present.  Musculoskeletal:        General: No swelling, tenderness, deformity or signs of injury. Normal range of motion.     Cervical back: Normal range of motion and neck supple. No rigidity or tenderness.     Right lower leg: No edema.     Left lower leg: No edema.  Lymphadenopathy:     Cervical: No cervical adenopathy.  Skin:    General: Skin is warm and dry.     Capillary Refill: Capillary refill takes less than 2 seconds.     Coloration: Skin is not jaundiced or pale.     Findings: Rash present. No bruising, erythema or lesion.  Neurological:     General:  No focal deficit present.     Mental Status: She is alert and oriented to person, place, and time. Mental status is at baseline.     Cranial Nerves: No cranial nerve deficit.     Sensory: No sensory deficit.     Motor: No weakness.     Coordination: Coordination normal.     Gait: Gait normal.     Deep Tendon Reflexes: Reflexes normal.  Psychiatric:        Mood and Affect: Mood normal.        Behavior: Behavior normal.        Thought Content: Thought content normal.        Judgment: Judgment normal.   LABS:   CBC Latest Ref Rng & Units 11/27/2020 11/06/2020 10/16/2020  WBC - 4.5 5.0 4.7  Hemoglobin 12.0 - 16.0 10.8(A) 9.3(A) 10.1(A)  Hematocrit 36 - 46 33(A) 28(A) 30(A)  Platelets 150 - 399 276 283 203   CMP Latest Ref Rng & Units 11/27/2020 11/06/2020 10/16/2020  Glucose 70 - 99 mg/dL - - -  BUN 4 - 21 19 12 13   Creatinine 0.5 - 1.1 0.5 0.5 0.5  Sodium 137 - 147 138 141 138  Potassium 3.4 - 5.3 4.7 3.9 3.5  Chloride 99 - 108 101 104 102  CO2 13 - 22 30(A) 32(A) 32(A)  Calcium 8.7 - 10.7 9.5 9.2 9.3  Alkaline Phos 25 - 125 84 70 79  AST 13 - 35 28 25 32  ALT 7 - 35 21 17 31      No results found for: CEA1 / No results found for: CEA1 No results found for: PSA1 No results found for: SEG315 No results found for: VVO160  No results found for: TOTALPROTELP,  ALBUMINELP, A1GS, A2GS, BETS, BETA2SER, GAMS, MSPIKE, SPEI No results found for: TIBC, FERRITIN, IRONPCTSAT No results found for: LDH  STUDIES:  MR BREAST BILATERAL W WO CONTRAST INC CAD  Result Date: 11/15/2020 CLINICAL DATA:  Patient with history of poorly differentiated carcinoma left breast. Patient status post chemotherapy. EXAM: BILATERAL BREAST MRI WITH AND WITHOUT CONTRAST TECHNIQUE: Multiplanar, multisequence MR images of both breasts were obtained prior to and following the intravenous administration of 6 ml of Gadavist Three-dimensional MR images were rendered by post-processing of the original MR data on an independent workstation. The three-dimensional MR images were interpreted, and findings are reported in the following complete MRI report for this study. Three dimensional images were evaluated at the independent interpreting workstation using the DynaCAD thin client. COMPARISON:  Bilateral breast MRI 06/22/2020. FINDINGS: Breast composition: c. Heterogeneous fibroglandular tissue. Background parenchymal enhancement: Mild Right breast: No mass or abnormal enhancement. Left breast: Within the upper inner left breast anterior depth there is a persistent 1.3 x 1.2 cm peripherally enhancing mass compatible with biopsy-proven carcinoma. This is slightly decreased in size from prior exam where it measured 1.7 x 1.6 cm. There is still mild peripheral irregular enhancement surrounding the mass. No additional suspicious findings within the left breast. Similar posterior central lumpectomy changes. Lymph nodes: No abnormal appearing lymph nodes. Ancillary findings:  None. IMPRESSION: Mild interval decrease in size of left breast mass which still demonstrates peripheral irregular enhancement. RECOMMENDATION: Treatment plan for known left breast malignancy. BI-RADS CATEGORY  6: Known biopsy-proven malignancy. Electronically Signed   By: Lovey Newcomer M.D.   On: 11/15/2020 14:02     HISTORY:   Past  Medical History:  Diagnosis Date   Cancer [facility])    Depression  Diabetes mellitus without complication ([place])    Hyperlipidemia    Hypomagnesemia 08/10/2020   Sleep apnea     Past Surgical History:  Procedure Laterality Date   ABDOMINAL HYSTERECTOMY     BREAST SURGERY Left    HYSTERECTOMY ABDOMINAL WITH SALPINGO-OOPHORECTOMY  07/2003   due to fibroids   PORTACATH PLACEMENT Right 06/26/2020   Procedure: INSERTION PORT-A-CATH;  Surgeon: [name], MD;  Location: [facility];  Service: General;  Laterality: Right;   TRIGGER FINGER RELEASE      Family History  Problem Relation Age of Onset   Breast cancer Mother 8   Breast cancer Maternal Aunt 25   Ovarian cancer Maternal Grandmother 91   Bone cancer Maternal Grandfather        39s   Cancer Maternal Aunt 55    Social History:  reports that she has never smoked. She has never used smokeless tobacco. She reports current alcohol use. No history on file for drug use.The patient is alone today.  Allergies:  Allergies  Allergen Reactions   Oxycodone Itching   Codeine Other (See Comments)    Other reaction(s): Other (See Comments) Unknown Unknown    Lisinopril     Other reaction(s): Cough (ALLERGY/intolerance)    Current Medications: Current Outpatient Medications  Medication Sig Dispense Refill   gabapentin (NEURONTIN) 300 MG capsule Take 1 capsule (300 mg total) by mouth 3 (three) times daily. 90 capsule 0   aspirin 81 MG chewable tablet Chew by mouth.     benzonatate (TESSALON) 100 MG capsule      calcium-vitamin D 250-100 MG-UNIT tablet Take by mouth.     celecoxib (CELEBREX) 100 MG capsule Take by mouth.     dexamethasone (DECADRON) 4 MG tablet Take 2 tablets (8 mg total) by mouth 2 (two) times daily. Start the day before Taxotere. Then take daily x 3 days after chemotherapy. (Patient taking differently: Take 4 mg by mouth 2 (two) times daily. Start the day before Taxotere. Then take daily x 3 days  after chemotherapy.) 30 tablet 1   diphenoxylate-atropine (LOMOTIL) 2.5-0.025 MG tablet Take 2 tablets by mouth 4 (four) times daily as needed for diarrhea or loose stools. 60 tablet 3   Docusate Sodium (DSS) 100 MG CAPS Take by mouth.     dronabinol (MARINOL) 5 MG capsule Take 1 capsule (5 mg total) by mouth at bedtime. 30 capsule 2   DULoxetine (CYMBALTA) 60 MG capsule Take 1 capsule by mouth daily.     FLUoxetine (PROZAC) 40 MG capsule Take by mouth.     fluticasone (FLONASE) 50 MCG/ACT nasal spray      HYDROcodone-acetaminophen (NORCO) 7.5-325 MG tablet Take 1 tablet by mouth every 4 (four) hours as needed for moderate pain. 120 tablet 0   lidocaine-prilocaine (EMLA) cream Apply to affected area once 30 g 3   LORazepam (ATIVAN) 0.5 MG tablet Take 1 tablet (0.5 mg total) by mouth every 6 (six) hours as needed for anxiety. 90 tablet 0   losartan (COZAAR) 50 MG tablet daily.     magic mouthwash (nystatin, lidocaine, diphenhydrAMINE) suspension Take 5 mLs by mouth every 3 (three) hours as needed for mouth pain. Swish & Spit; 282m bottle [facility] 1305 per Rutherford Alarie P,NP     magnesium oxide (MAG-OX) 400 (241.3 Mg) MG tablet Take 1 tablet (400 mg total) by mouth 2 (two) times daily. 60 tablet 1   meclizine (ANTIVERT) 25 MG tablet Take 1  tablet (25 mg total) by mouth 3 (three) times daily as needed for dizziness. 30 tablet 0   metFORMIN (GLUCOPHAGE) 500 MG tablet 2 (two) times daily.     Multiple Vitamins-Minerals (THERA-M) TABS Take by mouth.     ondansetron (ZOFRAN) 8 MG tablet Take 1 tablet (8 mg total) by mouth 2 (two) times daily as needed (Nausea or vomiting). Start on the third day after chemotherapy. 30 tablet 1   prochlorperazine (COMPAZINE) 10 MG tablet Take 1 tablet (10 mg total) by mouth every 6 (six) hours as needed (Nausea or vomiting). 30 tablet 1   rosuvastatin (CRESTOR) 40 MG tablet daily.     traMADol (ULTRAM) 50 MG tablet Take 1-2 tablets (50-100  mg total) by mouth every 6 (six) hours as needed. 60 tablet 0   vitamin B-12 (CYANOCOBALAMIN) 500 MCG tablet Take 500 mcg by mouth daily.     No current facility-administered medications for this visit.     ASSESSMENT & PLAN:   Assessment:  1.  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 This is felt to most likely represent a recurrence rather than a 2nd breast primary.  There really is no way to prove this and the slides from her prior cancer are no longer available. She is receiving neoadjuvant TCHP and has had much difficulty tolerating it.  After 4 cycles, the doses were reduced by 25%. She still is experiencing adverse effects, but they are better controlled with medications. She completed chemotherapy. She will receive one cycle of HER2 targeted therapy prior to reconstructive surgery scheduled for July. She tolerated her first maintenance HER2 therapy poorly with rash and severe knee pain. Her rash has almost completely faded. I will send in gabapentin for her knees. We will hold treatment for now until after surgery.      Plan:     She will return to clinic 2-3 weeks after surgery for evaluation and discussion when to restart treatment.    She verbalizes understanding of and agreement to the plans discussed today. She knows to call the office should any new questions or concerns arise.    [name], NP

## 2020-11-29 ENCOUNTER — Inpatient Hospital Stay: Payer: Medicare Other

## 2020-11-30 ENCOUNTER — Encounter: Payer: Self-pay | Admitting: Oncology

## 2020-12-03 ENCOUNTER — Encounter: Payer: Self-pay | Admitting: Oncology

## 2020-12-04 ENCOUNTER — Other Ambulatory Visit: Payer: Self-pay

## 2020-12-04 ENCOUNTER — Encounter (HOSPITAL_BASED_OUTPATIENT_CLINIC_OR_DEPARTMENT_OTHER): Payer: Self-pay | Admitting: General Surgery

## 2020-12-05 NOTE — Patient Instructions (Addendum)
Patient is reminded to call for any other concerns/changes or questions She will bring her personal C-PAP machine on day of surgery.

## 2020-12-05 NOTE — Progress Notes (Signed)
11/26/20     Patient ID: Tammy Fowler, female    DOB: Jul 25, 1950, 70 y.o.   MRN: [id number]  Chief Complaint  Patient presents with   Pre-op Exam    No diagnosis found. Recurrent left breast cancer   History of Present Illness: Tammy Fowler is a 70 y.o.  female  with a history of left breast cancer.  She presents for preoperative evaluation for upcoming procedure, Bilateral breast reconstruction with tissue expanders and acellular dermal matrix scheduled for 12/11/20 with Dr. Claudia Desanctis.  The patient has not had problems with anesthesia.   Summary of Previous Visit: consult with Dr. Claudia Desanctis for breast reconstruction options.  Job: n/a  PMH Significant for:  Left breast cancer DM- type II last A1-C=7.2 Sleep Apnea- C-pap machine use at home Depression Hypokalemia Hypomagnesemia Osteoporosis    Past Medical History: Allergies: Allergies  Allergen Reactions   Oxycodone Itching   Codeine Other (See Comments)    Other reaction(s): Other (See Comments) Unknown Unknown    Lisinopril     Other reaction(s): Cough (ALLERGY/intolerance)    Current Medications:  Current Outpatient Medications:    aspirin 81 MG chewable tablet, Chew by mouth., Disp: , Rfl:    benzonatate (TESSALON) 100 MG capsule, , Disp: , Rfl:    calcium-vitamin D 250-100 MG-UNIT tablet, Take by mouth., Disp: , Rfl:    celecoxib (CELEBREX) 100 MG capsule, Take by mouth., Disp: , Rfl:    dexamethasone (DECADRON) 4 MG tablet, Take 2 tablets (8 mg total) by mouth 2 (two) times daily. Start the day before Taxotere. Then take daily x 3 days after chemotherapy. (Patient taking differently: Take 4 mg by mouth 2 (two) times daily. Start the day before Taxotere. Then take daily x 3 days after chemotherapy.), Disp: 30 tablet, Rfl: 1   diphenoxylate-atropine (LOMOTIL) 2.5-0.025 MG tablet, Take 2 tablets by mouth 4 (four) times daily as needed for diarrhea or loose stools., Disp: 60 tablet, Rfl: 3   Docusate Sodium (DSS)  100 MG CAPS, Take by mouth., Disp: , Rfl:    dronabinol (MARINOL) 5 MG capsule, Take 1 capsule (5 mg total) by mouth at bedtime., Disp: 30 capsule, Rfl: 2   DULoxetine (CYMBALTA) 60 MG capsule, Take 1 capsule by mouth daily., Disp: , Rfl:    FLUoxetine (PROZAC) 40 MG capsule, Take by mouth., Disp: , Rfl:    fluticasone (FLONASE) 50 MCG/ACT nasal spray, , Disp: , Rfl:    gabapentin (NEURONTIN) 300 MG capsule, Take 1 capsule (300 mg total) by mouth 3 (three) times daily., Disp: 90 capsule, Rfl: 0   HYDROcodone-acetaminophen (NORCO) 7.5-325 MG tablet, Take 1 tablet by mouth every 4 (four) hours as needed for moderate pain., Disp: 120 tablet, Rfl: 0   lidocaine-prilocaine (EMLA) cream, Apply to affected area once, Disp: 30 g, Rfl: 3   LORazepam (ATIVAN) 0.5 MG tablet, Take 1 tablet (0.5 mg total) by mouth every 6 (six) hours as needed for anxiety., Disp: 90 tablet, Rfl: 0   losartan (COZAAR) 50 MG tablet, daily., Disp: , Rfl:    magic mouthwash (nystatin, lidocaine, diphenhydrAMINE) suspension, Take 5 mLs by mouth every 3 (three) hours as needed for mouth pain. Swish & Spit; 230mL bottle 3 refills called to [facility] 07/10/2020 @@ 1305 per Melissa P,NP, Disp: , Rfl:    magnesium oxide (MAG-OX) 400 (241.3 Mg) MG tablet, Take 1 tablet (400 mg total) by mouth 2 (two) times daily., Disp: 60 tablet, Rfl: 1   meclizine (ANTIVERT) 25  MG tablet, Take 1 tablet (25 mg total) by mouth 3 (three) times daily as needed for dizziness., Disp: 30 tablet, Rfl: 0   metFORMIN (GLUCOPHAGE) 500 MG tablet, 2 (two) times daily., Disp: , Rfl:    Multiple Vitamins-Minerals (THERA-M) TABS, Take by mouth., Disp: , Rfl:    ondansetron (ZOFRAN) 8 MG tablet, Take 1 tablet (8 mg total) by mouth 2 (two) times daily as needed (Nausea or vomiting). Start on the third day after chemotherapy., Disp: 30 tablet, Rfl: 1   prochlorperazine (COMPAZINE) 10 MG tablet, Take 1 tablet (10 mg total) by mouth every 6 (six) hours as needed (Nausea or  vomiting)., Disp: 30 tablet, Rfl: 1   rosuvastatin (CRESTOR) 40 MG tablet, daily., Disp: , Rfl:    traMADol (ULTRAM) 50 MG tablet, Take 1-2 tablets (50-100 mg total) by mouth every 6 (six) hours as needed., Disp: 60 tablet, Rfl: 0   vitamin B-12 (CYANOCOBALAMIN) 500 MCG tablet, Take 500 mcg by mouth daily., Disp: , Rfl:   Past Medical Problems: Past Medical History:  Diagnosis Date   Cancer ([place])    Depression    Diabetes mellitus without complication ([place])    Hyperlipidemia    Hypomagnesemia 08/10/2020   Sleep apnea     Past Surgical History: Past Surgical History:  Procedure Laterality Date   ABDOMINAL HYSTERECTOMY     BREAST SURGERY Left    HYSTERECTOMY ABDOMINAL WITH SALPINGO-OOPHORECTOMY  07/2003   due to fibroids   PORTACATH PLACEMENT Right 06/26/2020   Procedure: INSERTION PORT-A-CATH;  Surgeon: [name], MD;  Location: [facility];  Service: General;  Laterality: Right;   TRIGGER FINGER RELEASE      Social History: Social History   Socioeconomic History   Marital status: Married    Spouse name: Not on file   Number of children: 2   Years of education: Not on file   Highest education level: Not on file  Occupational History   Not on file  Tobacco Use   Smoking status: Never   Smokeless tobacco: Never  Substance and Sexual Activity   Alcohol use: Yes    Comment: wine   Drug use: Not on file   Sexual activity: Not on file  Other Topics Concern   Not on file  Social History Narrative   Not on file   Social Determinants of Health   Financial Resource Strain: Not on file  Food Insecurity: Not on file  Transportation Needs: Not on file  Physical Activity: Not on file  Stress: Not on file  Social Connections: Not on file  Intimate Partner Violence: Not on file    Family History: Family History  Problem Relation Age of Onset   Breast cancer Mother 38   Breast cancer Maternal Aunt 25   Ovarian cancer Maternal Grandmother 56    Bone cancer Maternal Grandfather        66s   Cancer Maternal Aunt 55    Review of Systems: Patient denies the following: No cardiac history- did have cardiac study 1 yr ago by Dr. Harriet Masson- patient states this was negative for any cardiac complications- she was unsure of the exact test performed. She does not smoke No hx of asthma/or other respiratory complications She does have sleep apnea & uses C-Pap machine at home- I instructed her to bring this with her on day of surgery. Denies any complications with anesthesia No hx of DVT/PE or varicose veins Her Dad had varicose veins that were treated  &  had MI- died at age 3 Last round of current Chemo was 11/16/20  Physical Exam: Vital Signs BP 119/75 (BP Location: Right Arm, Patient Position: Sitting, Cuff Size: Small)   Pulse 99   Ht 5\' 2"  (1.575 m)   Wt 127 lb 9.6 oz (57.9 kg)   SpO2 99%   BMI 23.34 kg/m   Physical Exam   Constitutional:      General: Not in acute distress.    Appearance: Normal appearance. Not ill-appearing.  HENT:     Head: Normocephalic and atraumatic.  Eyes:     Pupils: Pupils are equal, round Neck:     Musculoskeletal: Normal range of motion.  Cardiovascular:     Rate and Rhythm: Normal rate    Pulses: Normal pulses.  Pulmonary:     Effort: Pulmonary effort is normal. No respiratory distress.  Abdominal:     General: Abdomen is flat. There is no distension.  Musculoskeletal: Normal range of motion.  Skin:    General: Skin is warm and dry.     Findings: No erythema or rash.  Neurological:     General: No focal deficit present.     Mental Status: Alert and oriented to person, place, and time. Mental status is at baseline.     Motor: No weakness.  Psychiatric:        Mood and Affect: Mood normal.        Behavior: Behavior normal.  Hx of depression  Assessment/Plan: The patient is scheduled for bilateral breast reconstruction with tissue expanders and acellular dermal matrix with Dr. Claudia Desanctis.   Risks, benefits, and alternatives of procedure discussed, questions answered and consent obtained.    Smoking Status: non- smoker; Counseling Given? N/A Last Mammogram: 2022- ; Results: left breast cancer  Caprini Score: 8; Risk Factors include: length of planned surgery/  hx of cancer / recent porta-cath placement / age / Recommendation for mechanical yes- SCD pharmacological prophylaxis. Encourage early ambulation.   Pictures obtained: yes  Post-op Rx sent to pharmacy: Dr. Claudia Desanctis  Patient was provided with the  General Surgical Risk consent document and Pain Medication Agreement prior to their appointment.  They had adequate time to read through the risk consent documents and Pain Medication Agreement. We also discussed them in person together during this preop appointment. All of their questions were answered to their satisfaction.  Recommended calling if they have any further questions.  Risk consent form and Pain Medication Agreement to be scanned into patient's chart.     Electronically signed by: [place], RN 12/05/2020 1:51 PM

## 2020-12-10 ENCOUNTER — Other Ambulatory Visit: Payer: Self-pay | Admitting: General Surgery

## 2020-12-11 ENCOUNTER — Observation Stay (HOSPITAL_BASED_OUTPATIENT_CLINIC_OR_DEPARTMENT_OTHER)
Admission: RE | Admit: 2020-12-11 | Discharge: 2020-12-12 | Disposition: A | Discharge status: Home / Self Care | Payer: Medicare Other | Attending: Plastic Surgery | Admitting: Plastic Surgery

## 2020-12-11 ENCOUNTER — Ambulatory Visit (HOSPITAL_BASED_OUTPATIENT_CLINIC_OR_DEPARTMENT_OTHER): Payer: Medicare Other | Admitting: Certified Registered"

## 2020-12-11 ENCOUNTER — Other Ambulatory Visit: Payer: Self-pay

## 2020-12-11 ENCOUNTER — Encounter (HOSPITAL_BASED_OUTPATIENT_CLINIC_OR_DEPARTMENT_OTHER): Payer: Self-pay | Admitting: General Surgery

## 2020-12-11 ENCOUNTER — Ambulatory Visit (HOSPITAL_COMMUNITY): Payer: Medicare Other

## 2020-12-11 ENCOUNTER — Encounter (HOSPITAL_BASED_OUTPATIENT_CLINIC_OR_DEPARTMENT_OTHER)
Admission: RE | Disposition: A | Discharge status: Home / Self Care | Payer: Self-pay | Source: Home / Self Care | Attending: Plastic Surgery

## 2020-12-11 DIAGNOSIS — Z803 Family history of malignant neoplasm of breast: Secondary | ICD-10-CM | POA: Diagnosis not present

## 2020-12-11 DIAGNOSIS — Z7982 Long term (current) use of aspirin: Secondary | ICD-10-CM | POA: Insufficient documentation

## 2020-12-11 DIAGNOSIS — Z419 Encounter for procedure for purposes other than remedying health state, unspecified: Secondary | ICD-10-CM

## 2020-12-11 DIAGNOSIS — Z79899 Other long term (current) drug therapy: Secondary | ICD-10-CM | POA: Insufficient documentation

## 2020-12-11 DIAGNOSIS — C50412 Malignant neoplasm of upper-outer quadrant of left female breast: Secondary | ICD-10-CM | POA: Diagnosis present

## 2020-12-11 DIAGNOSIS — Z170 Estrogen receptor positive status [ER+]: Secondary | ICD-10-CM

## 2020-12-11 DIAGNOSIS — E119 Type 2 diabetes mellitus without complications: Secondary | ICD-10-CM | POA: Diagnosis not present

## 2020-12-11 DIAGNOSIS — C50919 Malignant neoplasm of unspecified site of unspecified female breast: Secondary | ICD-10-CM | POA: Diagnosis present

## 2020-12-11 DIAGNOSIS — C50912 Malignant neoplasm of unspecified site of left female breast: Secondary | ICD-10-CM

## 2020-12-11 DIAGNOSIS — Z7984 Long term (current) use of oral hypoglycemic drugs: Secondary | ICD-10-CM | POA: Insufficient documentation

## 2020-12-11 DIAGNOSIS — N6021 Fibroadenosis of right breast: Secondary | ICD-10-CM | POA: Insufficient documentation

## 2020-12-11 HISTORY — PX: NIPPLE SPARING MASTECTOMY: SHX6537

## 2020-12-11 HISTORY — PX: BREAST RECONSTRUCTION WITH PLACEMENT OF TISSUE EXPANDER AND FLEX HD (ACELLULAR HYDRATED DERMIS): SHX6295

## 2020-12-11 LAB — GLUCOSE, CAPILLARY
Glucose-Capillary: 132 mg/dL — ABNORMAL HIGH (ref 70–99)
Glucose-Capillary: 177 mg/dL — ABNORMAL HIGH (ref 70–99)

## 2020-12-11 IMAGING — CR DG CHEST 1V
1 series · 1 of 1 positions shown · non-contrast
Comparison: None.

CLINICAL DATA: Incorrect surgical count. History of bilateral
nipple sparing mastectomy.

EXAM:
CHEST  1 VIEW

[chest ap]
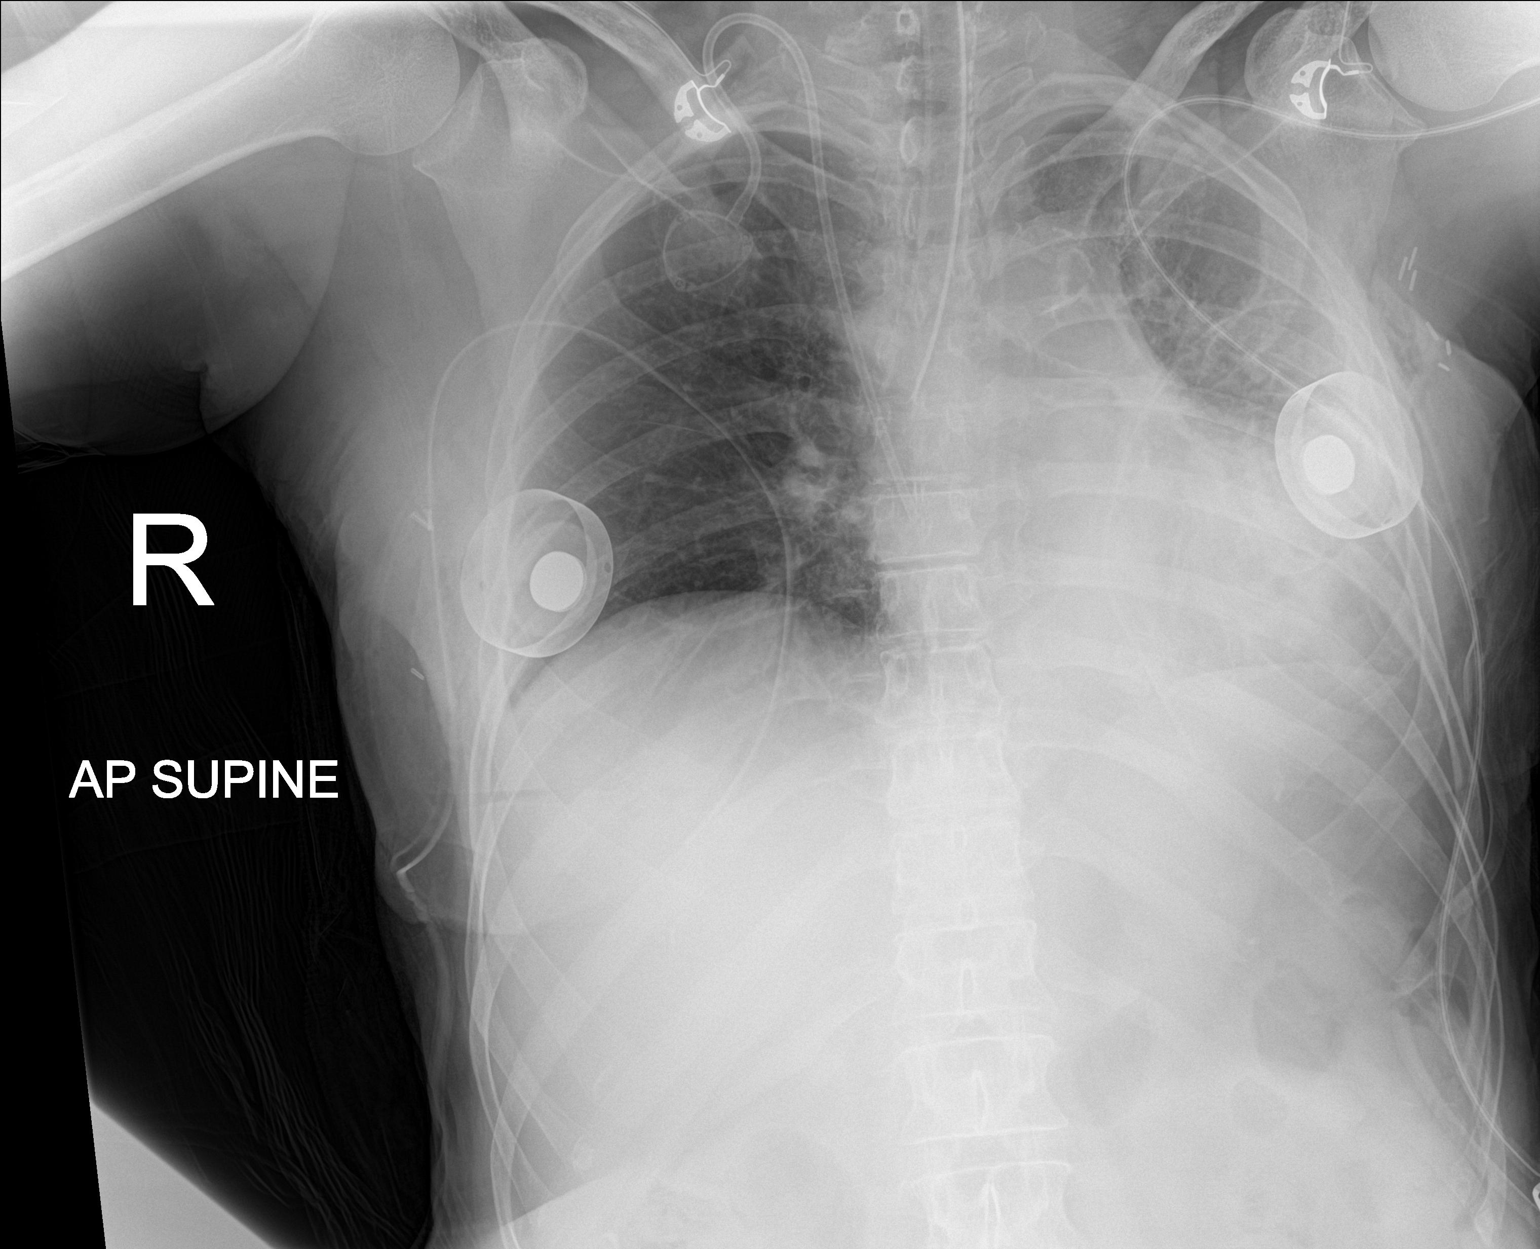

[1 of 1 positions shown; findings below may reference images not displayed]

FINDINGS: Endotracheal tube extends into the right mainstem bronchus.
Recommend pulling the tube back approximately 5 cm. Patchy densities
in left lung compatible with volume loss secondary to the right
mainstem intubation. There is a right jugular Port-A-Cath with the
tip at the superior cavoatrial junction. Evidence for tissue
expanders and probable drains on both sides of the chest. No
unexpected radiopaque or metallic structures. Evidence for surgical
clips in the axilla regions bilaterally.
IMPRESSION: 1. Endotracheal tube extends in the right mainstem bronchus.
Recommend pulling the tube back approximately 5 cm for optimal
positioning.
2. Patchy densities in left lung compatible with atelectasis from
the right mainstem intubation.
3. No unexpected radiopaque or metallic structures in the chest.
4. Results were called to the operating room at [DATE] on [DATE].

## 2020-12-11 SURGERY — MASTECTOMY, NIPPLE SPARING
Anesthesia: General | Site: Breast | Laterality: Bilateral

## 2020-12-11 MED ORDER — ROCURONIUM BROMIDE 10 MG/ML (PF) SYRINGE
PREFILLED_SYRINGE | INTRAVENOUS | Status: AC
  Filled 2020-12-11: qty 10

## 2020-12-11 MED ORDER — SULFAMETHOXAZOLE-TRIMETHOPRIM 800-160 MG PO TABS: 1.0000 | ORAL_TABLET | Freq: Two times a day (BID) | ORAL | 0 refills | Status: DC

## 2020-12-11 MED ORDER — SULFAMETHOXAZOLE-TRIMETHOPRIM 800-160 MG PO TABS
1.0000 | ORAL_TABLET | Freq: Two times a day (BID) | ORAL | Status: DC
  Administered 2020-12-11: 1 via ORAL
  Filled 2020-12-11: qty 1

## 2020-12-11 MED ORDER — OXYCODONE HCL 5 MG/5ML PO SOLN: 5.0000 mg | Freq: Once | ORAL | Status: AC | PRN

## 2020-12-11 MED ORDER — MIDAZOLAM HCL 2 MG/2ML IJ SOLN
2.0000 mg | Freq: Once | INTRAMUSCULAR | Status: AC
  Administered 2020-12-11: 2 mg via INTRAVENOUS

## 2020-12-11 MED ORDER — SODIUM CHLORIDE 0.9 % IV SOLN
INTRAVENOUS | Status: DC | PRN
  Administered 2020-12-11: 500 mL

## 2020-12-11 MED ORDER — METFORMIN HCL 500 MG PO TABS
500.0000 mg | ORAL_TABLET | Freq: Two times a day (BID) | ORAL | Status: DC
  Administered 2020-12-11: 500 mg via ORAL
  Filled 2020-12-11: qty 1

## 2020-12-11 MED ORDER — BUPIVACAINE LIPOSOME 1.3 % IJ SUSP
INTRAMUSCULAR | Status: DC | PRN
  Administered 2020-12-11 (×2): 10 mL

## 2020-12-11 MED ORDER — CHLORHEXIDINE GLUCONATE CLOTH 2 % EX PADS: 6.0000 | MEDICATED_PAD | Freq: Once | CUTANEOUS | Status: DC

## 2020-12-11 MED ORDER — ACETAMINOPHEN 325 MG PO TABS: 650.0000 mg | ORAL_TABLET | Freq: Four times a day (QID) | ORAL | Status: DC | PRN

## 2020-12-11 MED ORDER — FENTANYL CITRATE (PF) 100 MCG/2ML IJ SOLN
25.0000 ug | INTRAMUSCULAR | Status: DC | PRN
  Administered 2020-12-11 (×2): 25 ug via INTRAVENOUS
  Administered 2020-12-11: 50 ug via INTRAVENOUS

## 2020-12-11 MED ORDER — FENTANYL CITRATE (PF) 100 MCG/2ML IJ SOLN
INTRAMUSCULAR | Status: AC
  Filled 2020-12-11: qty 2

## 2020-12-11 MED ORDER — LORAZEPAM 1 MG PO TABS
0.5000 mg | ORAL_TABLET | Freq: Four times a day (QID) | ORAL | Status: DC | PRN
  Administered 2020-12-11: 0.5 mg via ORAL
  Filled 2020-12-11: qty 1

## 2020-12-11 MED ORDER — ACETAMINOPHEN 500 MG PO TABS
1000.0000 mg | ORAL_TABLET | ORAL | Status: AC
  Administered 2020-12-11: 1000 mg via ORAL

## 2020-12-11 MED ORDER — MIDAZOLAM HCL 5 MG/5ML IJ SOLN
INTRAMUSCULAR | Status: DC | PRN
  Administered 2020-12-11: 2 mg via INTRAVENOUS

## 2020-12-11 MED ORDER — FENTANYL CITRATE (PF) 100 MCG/2ML IJ SOLN
INTRAMUSCULAR | Status: DC | PRN
  Administered 2020-12-11 (×2): 50 ug via INTRAVENOUS

## 2020-12-11 MED ORDER — METHYLENE BLUE 0.5 % INJ SOLN
INTRAVENOUS | Status: AC
  Filled 2020-12-11: qty 10

## 2020-12-11 MED ORDER — PROPOFOL 10 MG/ML IV BOLUS
INTRAVENOUS | Status: AC
  Filled 2020-12-11: qty 20

## 2020-12-11 MED ORDER — EPHEDRINE SULFATE 50 MG/ML IJ SOLN
INTRAMUSCULAR | Status: DC | PRN
  Administered 2020-12-11: 10 mg via INTRAVENOUS

## 2020-12-11 MED ORDER — ENSURE PRE-SURGERY PO LIQD: 296.0000 mL | Freq: Once | ORAL | Status: DC

## 2020-12-11 MED ORDER — MIDAZOLAM HCL 2 MG/2ML IJ SOLN
INTRAMUSCULAR | Status: AC
  Filled 2020-12-11: qty 2

## 2020-12-11 MED ORDER — FENTANYL CITRATE (PF) 100 MCG/2ML IJ SOLN
100.0000 ug | Freq: Once | INTRAMUSCULAR | Status: AC
  Administered 2020-12-11: 100 ug via INTRAVENOUS

## 2020-12-11 MED ORDER — MEPERIDINE HCL 25 MG/ML IJ SOLN: 6.2500 mg | INTRAMUSCULAR | Status: DC | PRN

## 2020-12-11 MED ORDER — SODIUM CHLORIDE 0.9 % IV SOLN
INTRAVENOUS | Status: AC
  Filled 2020-12-11: qty 10

## 2020-12-11 MED ORDER — PROCHLORPERAZINE EDISYLATE 10 MG/2ML IJ SOLN: 5.0000 mg | Freq: Four times a day (QID) | INTRAMUSCULAR | Status: DC | PRN

## 2020-12-11 MED ORDER — DEXAMETHASONE SODIUM PHOSPHATE 10 MG/ML IJ SOLN
INTRAMUSCULAR | Status: DC | PRN
  Administered 2020-12-11: 5 mg via INTRAVENOUS

## 2020-12-11 MED ORDER — EPHEDRINE 5 MG/ML INJ
INTRAVENOUS | Status: AC
  Filled 2020-12-11: qty 10

## 2020-12-11 MED ORDER — ONDANSETRON HCL 4 MG/2ML IJ SOLN: 4.0000 mg | Freq: Four times a day (QID) | INTRAMUSCULAR | Status: DC | PRN

## 2020-12-11 MED ORDER — LIDOCAINE HCL (PF) 2 % IJ SOLN
INTRAMUSCULAR | Status: AC
  Filled 2020-12-11: qty 5

## 2020-12-11 MED ORDER — MORPHINE SULFATE (PF) 4 MG/ML IV SOLN: 1.0000 mg | INTRAVENOUS | Status: DC | PRN

## 2020-12-11 MED ORDER — GABAPENTIN 300 MG PO CAPS
300.0000 mg | ORAL_CAPSULE | Freq: Three times a day (TID) | ORAL | Status: DC
  Administered 2020-12-11 (×2): 300 mg via ORAL
  Filled 2020-12-11 (×2): qty 1

## 2020-12-11 MED ORDER — CEFAZOLIN SODIUM-DEXTROSE 2-4 GM/100ML-% IV SOLN
2.0000 g | INTRAVENOUS | Status: AC
  Administered 2020-12-11: 2 g via INTRAVENOUS

## 2020-12-11 MED ORDER — OXYCODONE HCL 5 MG PO TABS
5.0000 mg | ORAL_TABLET | Freq: Once | ORAL | Status: AC | PRN
Start: 2020-12-11 — End: 2020-12-11
  Administered 2020-12-11: 5 mg via ORAL
  Filled 2020-12-11: qty 1

## 2020-12-11 MED ORDER — PHENYLEPHRINE HCL-NACL 10-0.9 MG/250ML-% IV SOLN
INTRAVENOUS | Status: DC | PRN
Start: 2020-12-11 — End: 2020-12-11
  Administered 2020-12-11: 25 ug/min via INTRAVENOUS

## 2020-12-11 MED ORDER — PROPOFOL 10 MG/ML IV BOLUS
INTRAVENOUS | Status: DC | PRN
  Administered 2020-12-11: 150 mg via INTRAVENOUS

## 2020-12-11 MED ORDER — FLUOXETINE HCL 20 MG PO CAPS
40.0000 mg | ORAL_CAPSULE | Freq: Every day | ORAL | Status: DC
  Filled 2020-12-11: qty 2

## 2020-12-11 MED ORDER — KETOROLAC TROMETHAMINE 15 MG/ML IJ SOLN
15.0000 mg | INTRAMUSCULAR | Status: AC
  Administered 2020-12-11: 15 mg via INTRAVENOUS

## 2020-12-11 MED ORDER — ONDANSETRON HCL 4 MG/2ML IJ SOLN
INTRAMUSCULAR | Status: DC | PRN
  Administered 2020-12-11: 4 mg via INTRAVENOUS

## 2020-12-11 MED ORDER — BUPIVACAINE HCL 0.25 % IJ SOLN
INTRAMUSCULAR | Status: DC | PRN
  Administered 2020-12-11 (×2): 20 mL

## 2020-12-11 MED ORDER — LIDOCAINE 2% (20 MG/ML) 5 ML SYRINGE
INTRAMUSCULAR | Status: DC | PRN
Start: 2020-12-11 — End: 2020-12-11
  Administered 2020-12-11: 100 mg via INTRAVENOUS

## 2020-12-11 MED ORDER — LACTATED RINGERS IV SOLN: INTRAVENOUS | Status: DC

## 2020-12-11 MED ORDER — PHENYLEPHRINE HCL (PRESSORS) 10 MG/ML IV SOLN
INTRAVENOUS | Status: AC
  Filled 2020-12-11: qty 1

## 2020-12-11 MED ORDER — DULOXETINE HCL 60 MG PO CPEP
60.0000 mg | ORAL_CAPSULE | Freq: Every day | ORAL | Status: DC
  Filled 2020-12-11: qty 1

## 2020-12-11 MED ORDER — ACETAMINOPHEN 325 MG RE SUPP: 650.0000 mg | Freq: Four times a day (QID) | RECTAL | Status: DC | PRN

## 2020-12-11 MED ORDER — ONDANSETRON HCL 4 MG/2ML IJ SOLN: 4.0000 mg | Freq: Once | INTRAMUSCULAR | Status: DC | PRN

## 2020-12-11 MED ORDER — ROCURONIUM BROMIDE 100 MG/10ML IV SOLN
INTRAVENOUS | Status: DC | PRN
  Administered 2020-12-11: 100 mg via INTRAVENOUS

## 2020-12-11 MED ORDER — PHENYLEPHRINE 40 MCG/ML (10ML) SYRINGE FOR IV PUSH (FOR BLOOD PRESSURE SUPPORT)
PREFILLED_SYRINGE | INTRAVENOUS | Status: AC
  Filled 2020-12-11: qty 10

## 2020-12-11 MED ORDER — ACETAMINOPHEN 160 MG/5ML PO SOLN: 325.0000 mg | ORAL | Status: DC | PRN

## 2020-12-11 MED ORDER — CEFAZOLIN SODIUM-DEXTROSE 2-4 GM/100ML-% IV SOLN
INTRAVENOUS | Status: AC
  Filled 2020-12-11: qty 100

## 2020-12-11 MED ORDER — ONDANSETRON HCL 4 MG/2ML IJ SOLN
INTRAMUSCULAR | Status: AC
  Filled 2020-12-11: qty 2

## 2020-12-11 MED ORDER — INDOCYANINE GREEN 25 MG IV SOLR
INTRAVENOUS | Status: DC | PRN
  Administered 2020-12-11: 5 mg via INTRAVENOUS

## 2020-12-11 MED ORDER — PROCHLORPERAZINE MALEATE 10 MG PO TABS
10.0000 mg | ORAL_TABLET | Freq: Four times a day (QID) | ORAL | Status: DC | PRN
  Filled 2020-12-11: qty 1

## 2020-12-11 MED ORDER — DEXAMETHASONE SODIUM PHOSPHATE 10 MG/ML IJ SOLN
INTRAMUSCULAR | Status: AC
  Filled 2020-12-11: qty 1

## 2020-12-11 MED ORDER — METHOCARBAMOL 500 MG PO TABS
500.0000 mg | ORAL_TABLET | Freq: Four times a day (QID) | ORAL | Status: DC | PRN
  Administered 2020-12-11 – 2020-12-12 (×2): 500 mg via ORAL
  Filled 2020-12-11 (×2): qty 1

## 2020-12-11 MED ORDER — ACETAMINOPHEN 325 MG PO TABS: 325.0000 mg | ORAL_TABLET | ORAL | Status: DC | PRN

## 2020-12-11 MED ORDER — PHENYLEPHRINE HCL (PRESSORS) 10 MG/ML IV SOLN
INTRAVENOUS | Status: DC | PRN
  Administered 2020-12-11: 80 ug via INTRAVENOUS
  Administered 2020-12-11: 40 ug via INTRAVENOUS
  Administered 2020-12-11 (×3): 80 ug via INTRAVENOUS

## 2020-12-11 MED ORDER — ONDANSETRON 4 MG PO TBDP: 4.0000 mg | ORAL_TABLET | Freq: Four times a day (QID) | ORAL | Status: DC | PRN

## 2020-12-11 MED ORDER — LOSARTAN POTASSIUM 50 MG PO TABS
50.0000 mg | ORAL_TABLET | Freq: Every day | ORAL | Status: DC
  Filled 2020-12-11: qty 1

## 2020-12-11 MED ORDER — HYDROCODONE-ACETAMINOPHEN 5-325 MG PO TABS
1.0000 | ORAL_TABLET | ORAL | Status: DC | PRN
  Administered 2020-12-11 – 2020-12-12 (×3): 2 via ORAL
  Filled 2020-12-11 (×3): qty 2

## 2020-12-11 MED ORDER — KETOROLAC TROMETHAMINE 15 MG/ML IJ SOLN
INTRAMUSCULAR | Status: AC
  Filled 2020-12-11: qty 1

## 2020-12-11 MED ORDER — ACETAMINOPHEN 500 MG PO TABS
ORAL_TABLET | ORAL | Status: AC
  Filled 2020-12-11: qty 2

## 2020-12-11 MED ORDER — SUGAMMADEX SODIUM 200 MG/2ML IV SOLN
INTRAVENOUS | Status: DC | PRN
  Administered 2020-12-11: 130 mg via INTRAVENOUS

## 2020-12-11 MED ORDER — SODIUM CHLORIDE (PF) 0.9 % IJ SOLN
INTRAMUSCULAR | Status: AC
  Filled 2020-12-11: qty 10

## 2020-12-11 SURGICAL SUPPLY — 105 items
APPLIER CLIP 11 MED OPEN (CLIP)
APPLIER CLIP 9.375 MED OPEN (MISCELLANEOUS) ×2
BAG DECANTER FOR FLEXI CONT (MISCELLANEOUS) ×2 IMPLANT
BENZOIN TINCTURE PRP APPL 2/3 (GAUZE/BANDAGES/DRESSINGS) ×4 IMPLANT
BINDER BREAST LRG (GAUZE/BANDAGES/DRESSINGS) IMPLANT
BINDER BREAST MEDIUM (GAUZE/BANDAGES/DRESSINGS) IMPLANT
BINDER BREAST XLRG (GAUZE/BANDAGES/DRESSINGS) IMPLANT
BINDER BREAST XXLRG (GAUZE/BANDAGES/DRESSINGS) IMPLANT
BIOPATCH RED 1 DISK 7.0 (GAUZE/BANDAGES/DRESSINGS) ×4 IMPLANT
BLADE CLIPPER SURG (BLADE) IMPLANT
BLADE HEX COATED 2.75 (ELECTRODE) IMPLANT
BLADE SURG 10 STRL SS (BLADE) ×2 IMPLANT
BLADE SURG 15 STRL LF DISP TIS (BLADE) ×3 IMPLANT
BLADE SURG 15 STRL SS (BLADE) ×3
BNDG ELASTIC 6X10 VLCR STRL LF (GAUZE/BANDAGES/DRESSINGS) IMPLANT
BNDG ELASTIC 6X5.8 VLCR STR LF (GAUZE/BANDAGES/DRESSINGS) ×2 IMPLANT
CANISTER SUCT 1200ML W/VALVE (MISCELLANEOUS) ×2 IMPLANT
CHLORAPREP W/TINT 26 (MISCELLANEOUS) ×4 IMPLANT
CLIP APPLIE 11 MED OPEN (CLIP) IMPLANT
CLIP APPLIE 9.375 MED OPEN (MISCELLANEOUS) ×1 IMPLANT
CLIP TI WIDE RED SMALL 6 (CLIP) IMPLANT
COVER BACK TABLE 60X90IN (DRAPES) ×2 IMPLANT
COVER MAYO STAND STRL (DRAPES) ×2 IMPLANT
COVER PROBE W GEL 5X96 (DRAPES) ×2 IMPLANT
DECANTER SPIKE VIAL GLASS SM (MISCELLANEOUS) IMPLANT
DERMABOND ADVANCED (GAUZE/BANDAGES/DRESSINGS) ×2
DERMABOND ADVANCED .7 DNX12 (GAUZE/BANDAGES/DRESSINGS) ×2 IMPLANT
DRAIN CHANNEL 15F RND FF W/TCR (WOUND CARE) ×4 IMPLANT
DRAIN CHANNEL 19F RND (DRAIN) IMPLANT
DRAPE TOP ARMCOVERS (MISCELLANEOUS) ×2 IMPLANT
DRAPE U-SHAPE 76X120 STRL (DRAPES) ×2 IMPLANT
DRAPE UTILITY XL STRL (DRAPES) ×4 IMPLANT
DRSG PAD ABDOMINAL 8X10 ST (GAUZE/BANDAGES/DRESSINGS) ×8 IMPLANT
DRSG TEGADERM 2-3/8X2-3/4 SM (GAUZE/BANDAGES/DRESSINGS) ×4 IMPLANT
DRSG TEGADERM 4X10 (GAUZE/BANDAGES/DRESSINGS) IMPLANT
DRSG TEGADERM 4X4.75 (GAUZE/BANDAGES/DRESSINGS) IMPLANT
ELECT BLADE 4.0 EZ CLEAN MEGAD (MISCELLANEOUS) ×2
ELECT BLADE 6.5 EXT (BLADE) IMPLANT
ELECT COATED BLADE 2.86 ST (ELECTRODE) IMPLANT
ELECT REM PT RETURN 9FT ADLT (ELECTROSURGICAL) ×2
ELECTRODE BLDE 4.0 EZ CLN MEGD (MISCELLANEOUS) ×1 IMPLANT
ELECTRODE REM PT RTRN 9FT ADLT (ELECTROSURGICAL) ×1 IMPLANT
EVACUATOR SILICONE 100CC (DRAIN) ×4 IMPLANT
FUNNEL KELLER 2 DISP (MISCELLANEOUS) IMPLANT
GAUZE SPONGE 4X4 12PLY STRL (GAUZE/BANDAGES/DRESSINGS) ×2 IMPLANT
GLOVE SRG 8 PF TXTR STRL LF DI (GLOVE) IMPLANT
GLOVE SURG ENC MOIS LTX SZ6 (GLOVE) IMPLANT
GLOVE SURG ENC MOIS LTX SZ7 (GLOVE) ×2 IMPLANT
GLOVE SURG ENC TEXT LTX SZ7.5 (GLOVE) ×4 IMPLANT
GLOVE SURG UNDER POLY LF SZ7.5 (GLOVE) ×2 IMPLANT
GLOVE SURG UNDER POLY LF SZ8 (GLOVE)
GOWN STRL REUS W/ TWL LRG LVL3 (GOWN DISPOSABLE) ×4 IMPLANT
GOWN STRL REUS W/TWL LRG LVL3 (GOWN DISPOSABLE) ×4
GRAFT FLEX HD 19X22X0.7-1.4 (Tissue) ×4 IMPLANT
ILLUMINATOR WAVEGUIDE N/F (MISCELLANEOUS) IMPLANT
IMPL BREAST 300CC (Breast) ×2 IMPLANT
IMPLANT BREAST 300CC (Breast) ×4 IMPLANT
IV NS 500ML (IV SOLUTION) ×1
IV NS 500ML BAXH (IV SOLUTION) ×1 IMPLANT
KIT FILL SYSTEM UNIVERSAL (SET/KITS/TRAYS/PACK) ×2 IMPLANT
KIT MARKER MARGIN INK (KITS) IMPLANT
LIGHT WAVEGUIDE WIDE FLAT (MISCELLANEOUS) ×2 IMPLANT
MARKER SKIN DUAL TIP RULER LAB (MISCELLANEOUS) IMPLANT
NDL SAFETY ECLIPSE 18X1.5 (NEEDLE) IMPLANT
NEEDLE HYPO 18GX1.5 SHARP (NEEDLE)
NEEDLE HYPO 25X1 1.5 SAFETY (NEEDLE) IMPLANT
NS IRRIG 1000ML POUR BTL (IV SOLUTION) ×2 IMPLANT
PACK BASIN DAY SURGERY FS (CUSTOM PROCEDURE TRAY) ×2 IMPLANT
PACK SPY-PHI (KITS) ×2 IMPLANT
PENCIL SMOKE EVACUATOR (MISCELLANEOUS) ×2 IMPLANT
PIN SAFETY STERILE (MISCELLANEOUS) ×2 IMPLANT
RETRACTOR ONETRAX LX 135X30 (MISCELLANEOUS) IMPLANT
SHEET MEDIUM DRAPE 40X70 STRL (DRAPES) ×2 IMPLANT
SLEEVE SCD COMPRESS KNEE MED (STOCKING) ×2 IMPLANT
SPONGE T-LAP 18X18 ~~LOC~~+RFID (SPONGE) ×6 IMPLANT
STAPLER VISISTAT 35W (STAPLE) ×2 IMPLANT
STRIP CLOSURE SKIN 1/2X4 (GAUZE/BANDAGES/DRESSINGS) IMPLANT
STRIP SUTURE WOUND CLOSURE 1/2 (MISCELLANEOUS) ×4 IMPLANT
SUT ETHIBOND 2-0 V-5 NEEDLE (SUTURE) IMPLANT
SUT ETHILON 2 0 FS 18 (SUTURE) IMPLANT
SUT ETHILON 3 0 PS 1 (SUTURE) ×4 IMPLANT
SUT MNCRL AB 3-0 PS2 18 (SUTURE) IMPLANT
SUT MNCRL AB 4-0 PS2 18 (SUTURE) ×2 IMPLANT
SUT MON AB 3-0 SH 27 (SUTURE)
SUT MON AB 3-0 SH27 (SUTURE) IMPLANT
SUT MON AB 4-0 PC3 18 (SUTURE) IMPLANT
SUT PDS 3-0 CT2 (SUTURE) ×16
SUT PDS AB 2-0 CT2 27 (SUTURE) IMPLANT
SUT PDS II 3-0 CT2 27 ABS (SUTURE) ×8 IMPLANT
SUT PLAIN 5 0 P 3 18 (SUTURE) IMPLANT
SUT SILK 2 0 SH (SUTURE) IMPLANT
SUT VIC AB 3-0 SH 27 (SUTURE)
SUT VIC AB 3-0 SH 27X BRD (SUTURE) IMPLANT
SUT VICRYL 4-0 PS2 18IN ABS (SUTURE) IMPLANT
SUT VLOC 180 0 24IN GS25 (SUTURE) ×4 IMPLANT
SUT VLOC 90 P-14 23 (SUTURE) ×2 IMPLANT
SYR 50ML LL SCALE MARK (SYRINGE) IMPLANT
SYR BULB IRRIG 60ML STRL (SYRINGE) ×2 IMPLANT
SYR CONTROL 10ML LL (SYRINGE) IMPLANT
TAPE MEASURE VINYL STERILE (MISCELLANEOUS) IMPLANT
TOWEL GREEN STERILE FF (TOWEL DISPOSABLE) ×2 IMPLANT
TRAY FOLEY W/BAG SLVR 14FR LF (SET/KITS/TRAYS/PACK) ×2 IMPLANT
TUBE CONNECTING 20X1/4 (TUBING) ×2 IMPLANT
UNDERPAD 30X36 HEAVY ABSORB (UNDERPADS AND DIAPERS) ×4 IMPLANT
YANKAUER SUCT BULB TIP NO VENT (SUCTIONS) ×2 IMPLANT

## 2020-12-11 NOTE — Interval H&P Note (Signed)
History and Physical Interval Note:  12/11/2020 9:24 AM  Tammy Fowler  has presented today for surgery, with the diagnosis of RECURRENT BREAST CANCER.  The various methods of treatment have been discussed with the patient and family. After consideration of risks, benefits and other options for treatment, the patient has consented to  Procedure(s): BILATERAL NIPPLE SPARING MASTECTOMY (Bilateral) BILATERAL BREAST RECONSTRUCTION WITH PLACEMENT OF TISSUE EXPANDER AND FLEX HD (ACELLULAR HYDRATED DERMIS) (Bilateral) as a surgical intervention.  The patient's history has been reviewed, patient examined, no change in status, stable for surgery.  I have reviewed the patient's chart and labs.  Questions were answered to the patient's satisfaction.     [name]

## 2020-12-11 NOTE — Progress Notes (Signed)
Assisted Dr. Oddono with right, left, ultrasound guided, pectoralis block. Side rails up, monitors on throughout procedure. See vital signs in flow sheet. Tolerated Procedure well. 

## 2020-12-11 NOTE — Op Note (Signed)
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  Right risk reducing nipple sparing mastectomy 2.  Left nipple sparing mastectomy Surgeon: Dr. Serita Grammes Anesthesia: General with bilateral pectoral block Specimens: 1.  Right breast tissue marked short superior, long lateral, double marks nipple areola 2.  Right nipple biopsy 3.  Left breast tissue marked short superior, long lateral, double marks nipple areola 4.  Left nipple biopsy 5.  Additional left upper inner quadrant anterior breast tissue Complications: None Drains: Per plastic surgery Estimated blood loss: 75 cc Sponge and count was correct at the completion of my portion. Disposition Case turned over to plastic surgery for reconstruction  Indications:69 yof in 2004 in [place]  had a IIIA left breast cancer treated with lumpectomy/alnd (4/16 nodes positive), followed by AC-T and radiotherapy.  she did anastrozole for 10 years.  she underwent a lift on right and had a left lat flap for a defect laterally as well.Initially genetic testing was negative but had panel testing redone and has an ATM mutation.she noted a left breast mass recently.  mm shows c density breasts.  she has a 2 cm by [place] mass.  this is poorly diff ca (breast phenotype) that is er pos at 30, pr neg, her 2 pos and Ki 25%.  She has undergone tchp.  repeat mri shows no abnormality on right, left side with 1.3x1.2 cm uiq mass slightly decreased in size. No abnormal nodes.  We discussed proceeding with bilateral nipple sparing mastectomy after she saw plastic surgery.  Procedure: After informed consent was obtained the patient first underwent bilateral pectoral blocks.  She was given antibiotics. She had SCDs.  She was placed under general anesthesia without complication. She was prepped and draped in standard sterile surgical fashion. Timeout was  performed.  I first did the right side.  She had had a prior lift on this side.  I made an inframammary incision starting 9 cm from the xiphoid and extended this 10 cm laterally.  I then developed the posterior plane over the muscle including the fascia.  I did this to the clavicle, parasternal area, and latissimus laterally.  I then developed the anterior plane using a combination of electrocautery as well as scissor dissection.  This was very adherent from her prior surgery and her nipple and areola.  I made 2 small holes in the skin that I repaired with Monocryl suture and glue.  I then removed the remainder of the breast tissue.  I marked this as above.  I then inverted the nipple and remove the nipple tissue and sent this as a separate specimen.  I obtained hemostasis.  I placed sponges on the side.  I then turned my attention to the other side.  I then did the left side. I made a similar incision and then developed the posterior plane.  I created the anterior plane in a similar fashion.  I then remove this breast tissue.  This was difficult due to the fact that she had a flap on that side.  I did then remove the nipple biopsy in a similar fashion.  I removed in the area of the tumor some more breast tissue so that this margin is really only skin at this point.  This was also sent as a separate specimen.  I then marked the specimen as above.  Hemostasis obtained and the site was packed.  The case was then turned over  to plastic surgery for reconstruction.

## 2020-12-11 NOTE — Brief Op Note (Signed)
12/11/2020  1:24 PM  PATIENT:  Tammy Fowler  70 y.o. female  PRE-OPERATIVE DIAGNOSIS:  RECURRENT BREAST CANCER  POST-OPERATIVE DIAGNOSIS:  RECURRENT BREAST CANCER  PROCEDURE:  Procedure(s): BILATERAL NIPPLE SPARING MASTECTOMY (Bilateral) BILATERAL BREAST RECONSTRUCTION WITH PLACEMENT OF TISSUE EXPANDER AND FLEX HD (ACELLULAR HYDRATED DERMIS) (Bilateral)  SURGEON:  Surgeon(s) and Role: Panel 1:    [name], MD - Primary Panel 2:    * [name], MD - Primary  PHYSICIAN ASSISTANT: [place], RNFA  ASSISTANTS: none   ANESTHESIA:   general  EBL:  50 mL   BLOOD ADMINISTERED:none  DRAINS: (2) Jackson-Pratt drain(s) with closed bulb suction in the chest    LOCAL MEDICATIONS USED:  NONE  SPECIMEN:  No Specimen  DISPOSITION OF SPECIMEN:  N/A  COUNTS:  YES  TOURNIQUET:  * No tourniquets in log *  DICTATION: .Dragon Dictation  PLAN OF CARE: Admit for overnight observation  PATIENT DISPOSITION:  PACU - hemodynamically stable.   Delay start of Pharmacological VTE agent (>24hrs) due to surgical blood loss or risk of bleeding: not applicable

## 2020-12-11 NOTE — Anesthesia Postprocedure Evaluation (Signed)
Anesthesia Post Note  Patient: Tammy Fowler  Procedure(s) Performed: BILATERAL NIPPLE SPARING MASTECTOMY (Bilateral: Breast) BILATERAL BREAST RECONSTRUCTION WITH PLACEMENT OF TISSUE EXPANDER AND FLEX HD (ACELLULAR HYDRATED DERMIS) (Bilateral: Breast)     Patient location during evaluation: PACU Anesthesia Type: General Level of consciousness: awake and alert Pain management: pain level controlled Vital Signs Assessment: post-procedure vital signs reviewed and stable Respiratory status: spontaneous breathing, nonlabored ventilation, respiratory function stable and patient connected to nasal cannula oxygen Cardiovascular status: blood pressure returned to baseline and stable Postop Assessment: no apparent nausea or vomiting Anesthetic complications: no   No notable events documented.  Last Vitals:  Vitals:   12/11/20 1520 12/11/20 1655  BP: 128/73   Pulse: 85 81  Resp: 18 18  Temp: 37.1 C   SpO2: 98% 98%    Last Pain:  Vitals:   12/11/20 1451  TempSrc:   PainSc: Asleep                 Aayat Hajjar

## 2020-12-11 NOTE — Anesthesia Procedure Notes (Signed)
Anesthesia Regional Block: Pectoralis block   Pre-Anesthetic Checklist: , timeout performed,  Correct Patient, Correct Site, Correct Laterality,  Correct Procedure, Correct Position, site marked,  Risks and benefits discussed,  Surgical consent,  Pre-op evaluation,  At surgeon's request and post-op pain management  Laterality: Left and Right  Prep: chloraprep       Needles:  Injection technique: Single-shot  Needle Type: Echogenic Stimulator Needle     Needle Length: 5cm  Needle Gauge: 22     Additional Needles:   Procedures:, nerve stimulator,,, ultrasound used (permanent image in chart),,    Narrative:  Start time: 12/11/2020 8:10 AM End time: 12/11/2020 8:20 AM Injection made incrementally with aspirations every 5 mL.  Performed by: Personally  Anesthesiologist: Janeece Riggers, MD  Additional Notes: Functioning IV was confirmed and monitors were applied.  A 59m 22ga Arrow echogenic stimulator needle was used. Sterile prep and drape,hand hygiene and sterile gloves were used. Ultrasound guidance: relevant anatomy identified, needle position confirmed, local anesthetic spread visualized around nerve(s)., vascular puncture avoided.  Image printed for medical record. Negative aspiration and negative test dose prior to incremental administration of local anesthetic. The patient tolerated the procedure well.

## 2020-12-11 NOTE — H&P (Signed)
Tammy Fowler is an 70 y.o. female.   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  she did anastrozole for 10 years.  she underwent a lift on right and had a left lat flap for a defect latera as well. she has undergone tah/bso. 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 she has been followed as high risk. she noted a left breast mass recently.  mm shows c density breasts.  she has a 2 cm by [place] mass.  this is poorly diff ca (breast phenotype) that is er pos at 30, pr neg, her 2 pos and Ki 25%.  She has undergone tchp which she has had alot of trouble with.  she had n/v/d and dizziness. she remains dizzy and finished chemotherapy on June 1.  she required iv fluids alot and is just starting to feel somewhat better.  repeat mri shows no abnormality on right, left side with 1.3x1.2 cm uiq mass slightly decreased in size. No abnormal nodes.      Past Medical History:  Diagnosis Date   Cancer ([place])    Depression    Diabetes mellitus without complication ([place])    Hyperlipidemia    Hypomagnesemia 08/10/2020   Sleep apnea     Past Surgical History:  Procedure Laterality Date   ABDOMINAL HYSTERECTOMY     BREAST SURGERY Left    HYSTERECTOMY ABDOMINAL WITH SALPINGO-OOPHORECTOMY  07/2003   due to fibroids   PORTACATH PLACEMENT Right 06/26/2020   Procedure: INSERTION PORT-A-CATH;  Surgeon: [name], MD;  Location: [facility];  Service: General;  Laterality: Right;   TRIGGER FINGER RELEASE      Family History  Problem Relation Age of Onset   Breast cancer Mother 63   Breast cancer Maternal Aunt 25   Ovarian cancer Maternal Grandmother 41   Bone cancer Maternal Grandfather        60s   Cancer Maternal Aunt 55   Social History:  reports that she has never smoked. She has never used  smokeless tobacco. She reports current alcohol use. No history on file for drug use.  Allergies:  Allergies  Allergen Reactions   Oxycodone Itching   Codeine Other (See Comments)    Other reaction(s): Other (See Comments) Unknown Unknown    Lisinopril     Other reaction(s): Cough (ALLERGY/intolerance)    Medications Prior to Admission  Medication Sig Dispense Refill   aspirin 81 MG chewable tablet Chew by mouth.     benzonatate (TESSALON) 100 MG capsule      calcium-vitamin D 250-100 MG-UNIT tablet Take by mouth.     celecoxib (CELEBREX) 100 MG capsule Take by mouth.     dexamethasone (DECADRON) 4 MG tablet Take 2 tablets (8 mg total) by mouth 2 (two) times daily. Start the day before Taxotere. Then take daily x 3 days after chemotherapy. (Patient taking differently: Take 4 mg by mouth 2 (two) times daily. Start the day before Taxotere. Then take daily x 3 days after chemotherapy.) 30 tablet 1   diphenoxylate-atropine (LOMOTIL) 2.5-0.025 MG tablet Take 2 tablets by mouth 4 (four) times daily as needed for diarrhea or loose stools. 60 tablet 3   DULoxetine (CYMBALTA) 60 MG capsule Take 1 capsule by mouth daily.     FLUoxetine (PROZAC) 40 MG capsule Take by mouth.  fluticasone (FLONASE) 50 MCG/ACT nasal spray      gabapentin (NEURONTIN) 300 MG capsule Take 1 capsule (300 mg total) by mouth 3 (three) times daily. 90 capsule 0   HYDROcodone-acetaminophen (NORCO) 7.5-325 MG tablet Take 1 tablet by mouth every 4 (four) hours as needed for moderate pain. 120 tablet 0   LORazepam (ATIVAN) 0.5 MG tablet Take 1 tablet (0.5 mg total) by mouth every 6 (six) hours as needed for anxiety. 90 tablet 0   losartan (COZAAR) 50 MG tablet daily.     magnesium oxide (MAG-OX) 400 (241.3 Mg) MG tablet Take 1 tablet (400 mg total) by mouth 2 (two) times daily. 60 tablet 1   metFORMIN (GLUCOPHAGE) 500 MG tablet 2 (two) times daily.     Multiple Vitamins-Minerals (THERA-M) TABS Take by mouth.      rosuvastatin (CRESTOR) 40 MG tablet daily.     traMADol (ULTRAM) 50 MG tablet Take 1-2 tablets (50-100 mg total) by mouth every 6 (six) hours as needed. 60 tablet 0   vitamin B-12 (CYANOCOBALAMIN) 500 MCG tablet Take 500 mcg by mouth daily.     Docusate Sodium (DSS) 100 MG CAPS Take by mouth.     dronabinol (MARINOL) 5 MG capsule Take 1 capsule (5 mg total) by mouth at bedtime. 30 capsule 2   lidocaine-prilocaine (EMLA) cream Apply to affected area once 30 g 3   magic mouthwash (nystatin, lidocaine, diphenhydrAMINE) suspension Take 5 mLs by mouth every 3 (three) hours as needed for mouth pain. Swish & Spit; 2104m bottle 3 [facility]10/[facility] @@ 1305 per Melissa P,NP     meclizine (ANTIVERT) 25 MG tablet Take 1 tablet (25 mg total) by mouth 3 (three) times daily as needed for dizziness. 30 tablet 0   ondansetron (ZOFRAN) 8 MG tablet Take 1 tablet (8 mg total) by mouth 2 (two) times daily as needed (Nausea or vomiting). Start on the third day after chemotherapy. 30 tablet 1   prochlorperazine (COMPAZINE) 10 MG tablet Take 1 tablet (10 mg total) by mouth every 6 (six) hours as needed (Nausea or vomiting). 30 tablet 1    No results found for this or any previous visit (from the past 48 hour(s)). No results found.  Review of Systems  All other systems reviewed and are negative.  Height 5' 2"  (1.575 m), weight 57.6 kg. Physical Exam  General Mental Status - Alert. Orientation - Oriented X3. Breast Nipples - No Discharge. Breast Lump - No Palpable Breast Mass. Note:  no breast mass noted, left lateral lat flap Lymphatic Head & Neck General Head & Neck Lymphatics: Bilateral - Description - Normal. Axillary General Axillary Region: Bilateral - Description - Normal. Note:  no [place] adenopathy Cv regular Lungs effort normal  Assessment/Plan BREAST CANCER OF UPPER-OUTER QUADRANT OF LEFT  Left NSM right risk reducing NSM she desires left mastectomy and that is best plan for  her given recurrence after bct with radiotherapy. we discussed repeating mr just to see response. I think oncologically she is candidate for nsm as long as Dr PClaudia Desanctisfine. we discussed issues including nac loss either partial or total, cancer recurrence risk with all mastectomies, skin loss, expander loss, loss of sensation at nac. I will not repeat sn given alnd previously With ATM mutation she would like to do right mastectomy for risk reduction and could do nsm on that side also   MRolm Bookbinder MD 12/11/2020, 7:11 AM

## 2020-12-11 NOTE — Op Note (Signed)
Operative Note   DATE OF OPERATION: 12/11/2020  SURGICAL DEPARTMENT: Plastic Surgery  PREOPERATIVE DIAGNOSES: Breast cancer  POSTOPERATIVE DIAGNOSES:  same  PROCEDURE: 1.  Bilateral breast reconstruction with tissue expanders and acellular dermal matrix 2.  Indocyanine green angiography bilateral mastectomy flaps  SURGEON: Talmadge Coventry, MD  ASSISTANT: [place], RNFA The advanced practice practitioner (APP) assisted throughout the case.  The APP was essential in retraction and counter traction when needed to make the case progress smoothly.  This retraction and assistance made it possible to see the tissue plans for the procedure.  The assistance was needed for blood control, tissue re-approximation and assisted with closure of the incision site.  ANESTHESIA:  General.   COMPLICATIONS: None.   INDICATIONS FOR PROCEDURE:  The patient, Tammy Fowler is a 70 y.o. female born on 11-21-1950, is here for treatment of bilateral mastectomy defects as part of her surgical treatment for breast cancer MRN: [id number]  CONSENT:  Informed consent was obtained directly from the patient. Risks, benefits and alternatives were fully discussed. Specific risks including but not limited to bleeding, infection, hematoma, seroma, scarring, pain, contracture, asymmetry, wound healing problems, and need for further surgery were all discussed. The patient did have an ample opportunity to have questions answered to satisfaction.   DESCRIPTION OF PROCEDURE:  The patient was taken to the operating room. SCDs were placed and antibiotics were given.  General anesthesia was administered.  The patient's operative site was prepped and draped in a sterile fashion. A time out was performed and all information was confirmed to be correct.  Dr. Donne Hazel started off by performing bilateral nipple sparing mastectomies.  His part will be dictated separately.  I started by clinically expecting the skin flaps and they  look to be in good shape.  She had a prior left or reduction on the right side and a latissimus flap as part of her reconstruction for previous treatment on the left side which had a small skin paddle inferolaterally.  I then used the spy system to evaluate the perfusion with indocyanine green angiography.  Everything seemed to fill reasonably well.  The skin paddle on the left did fill a little slower but it ultimately looked fine.  There was a little bit of perfusion question right along the skin edge on both sides and so 3 to 4 mm of skin was excised along the incision on each side.  The skin edge bled nicely after that.  I then started by tacking down the lateral soft tissues on each side with a 0 V-Loc.  19 French drain was placed on each side secured with a nylon suture.  Expanders were then brought onto the field along with 2 pieces of Flex HD pliable pre which were rinsed for over than a minute each.  I elected to use a Mentor Arturo plus smooth tissue expander with a 300 cc fill volume on each side.  The serial number on the right side is [id number].  Serial number on the left side [id number].  All the air was removed from the expanders.  A 3-0 PDS pursestring was run around the periphery of the acellular dermal matrix.  The expanders were then placed into the matrix and the pursestring tied down.  I then planned out on the chest wall where the 3 6 and 9:00 suture tabs would end up and placed 3-0 PDS stay sutures in these locations.  The 3 6 and 9:00 suture tabs were then brought out through holes  in the acellular dermal matrix and run through the stay sutures.  Both pockets were copiously irrigated with triple antibiotic solution and the expanders were then placed and stay sutures tied down.  Skin was then closed with Enzor staples and a running 3 OV lock.  100 cc was infiltrated into each expander which but no tension on the skin.  Benzoin and Steri-Strips were applied to the incisions followed by a  soft wrap.  The patient tolerated the procedure well.  There were no complications. The patient was allowed to wake from anesthesia, extubated and taken to the recovery room in satisfactory condition.

## 2020-12-11 NOTE — Anesthesia Preprocedure Evaluation (Signed)
Anesthesia Evaluation  Patient identified by MRN, date of birth, ID band Patient awake    Reviewed: Allergy & Precautions, NPO status , Patient's Chart, lab work & pertinent test results  History of Anesthesia Complications Negative for: history of anesthetic complications  Airway Mallampati: I  TM Distance: >3 FB Neck ROM: Full    Dental  (+) Dental Advisory Given, Teeth Intact, Caps   Pulmonary sleep apnea ,    Pulmonary exam normal        Cardiovascular hypertension, Pt. on medications Normal cardiovascular exam     Neuro/Psych PSYCHIATRIC DISORDERS Depression negative neurological ROS     GI/Hepatic negative GI ROS, Neg liver ROS,   Endo/Other  diabetes, Type 2, Oral Hypoglycemic Agents  Renal/GU negative Renal ROS     Musculoskeletal negative musculoskeletal ROS (+)   Abdominal   Peds  Hematology negative hematology ROS (+)   Anesthesia Other Findings    Reproductive/Obstetrics  Breast cancer                              Anesthesia Physical  Anesthesia Plan  ASA: 3  Anesthesia Plan: General   Post-op Pain Management: GA combined w/ Regional for post-op pain   Induction: Intravenous  PONV Risk Score and Plan: 3 and Treatment may vary due to age or medical condition, Ondansetron and Dexamethasone  Airway Management Planned: LMA  Additional Equipment: None  Intra-op Plan:   Post-operative Plan: Extubation in OR  Informed Consent: I have reviewed the patients History and Physical, chart, labs and discussed the procedure including the risks, benefits and alternatives for the proposed anesthesia with the patient or authorized representative who has indicated his/her understanding and acceptance.     Dental advisory given  Plan Discussed with: CRNA and Anesthesiologist  Anesthesia Plan Comments: ( )        Anesthesia Quick Evaluation

## 2020-12-11 NOTE — Anesthesia Procedure Notes (Signed)
Procedure Name: Intubation Date/Time: 12/11/2020 9:52 AM Performed by: Lavonia Dana, CRNA Pre-anesthesia Checklist: Patient identified, Emergency Drugs available, Suction available and Patient being monitored Patient Re-evaluated:Patient Re-evaluated prior to induction Oxygen Delivery Method: Circle system utilized Preoxygenation: Pre-oxygenation with 100% oxygen Induction Type: IV induction Ventilation: Mask ventilation without difficulty Laryngoscope Size: Mac and 3 Grade View: Grade I Tube type: Oral Tube size: 7.0 mm Number of attempts: 1 Airway Equipment and Method: Stylet and Bite block Placement Confirmation: ETT inserted through vocal cords under direct vision, positive ETCO2 and breath sounds checked- equal and bilateral Secured at: 22 cm Tube secured with: Tape Dental Injury: Teeth and Oropharynx as per pre-operative assessment

## 2020-12-11 NOTE — Transfer of Care (Signed)
Immediate Anesthesia Transfer of Care Note  Patient: Tammy Fowler  Procedure(s) Performed: BILATERAL NIPPLE SPARING MASTECTOMY (Bilateral: Breast) BILATERAL BREAST RECONSTRUCTION WITH PLACEMENT OF TISSUE EXPANDER AND FLEX HD (ACELLULAR HYDRATED DERMIS) (Bilateral: Breast)  Patient Location: PACU  Anesthesia Type:GA combined with regional for post-op pain  Level of Consciousness: drowsy  Airway & Oxygen Therapy: Patient Spontanous Breathing and Patient connected to face mask oxygen  Post-op Assessment: Report given to RN and Post -op Vital signs reviewed and stable  Post vital signs: Reviewed and stable  Last Vitals:  Vitals Value Taken Time  BP 134/80 12/11/20 1352  Temp    Pulse 97 12/11/20 1356  Resp 17 12/11/20 1356  SpO2 95 % 12/11/20 1356  Vitals shown include unvalidated device data.  Last Pain:  Vitals:   12/11/20 0734  TempSrc: Oral  PainSc: 0-No pain         Complications: No notable events documented.

## 2020-12-11 NOTE — Discharge Instructions (Addendum)
Activity: As tolerated, but avoid strenuous activity until follow up visit.  Diet: Regular  Wound Care: Keep dressing clean & dry for 2 days.  After that you can shower normally.  Redress the wound as needed for comfort.  Would recommend a GENTLE wrap or sports bra.  Empty and record JP drain output and bring that record to your follow up appointment.  Special Instructions:  Call our office if any unusual problems occur such as pain, excessive bleeding, unrelieved nausea/vomiting, fever &/or chills.  Follow-up appointment: Scheduled for next week.  Information for Discharge Teaching: EXPAREL (bupivacaine liposome injectable suspension)   Your surgeon or anesthesiologist gave you EXPAREL(bupivacaine) to help control your pain after surgery.  EXPAREL is a local anesthetic that provides pain relief by numbing the tissue around the surgical site. EXPAREL is designed to release pain medication over time and can control pain for up to 72 hours. Depending on how you respond to EXPAREL, you may require less pain medication during your recovery.  Possible side effects: Temporary loss of sensation or ability to move in the area where bupivacaine was injected. Nausea, vomiting, constipation Rarely, numbness and tingling in your mouth or lips, lightheadedness, or anxiety may occur. Call your doctor right away if you think you may be experiencing any of these sensations, or if you have other questions regarding possible side effects.  Follow all other discharge instructions given to you by your surgeon or nurse. Eat a healthy diet and drink plenty of water or other fluids.  If you return to the hospital for any reason within 96 hours following the administration of EXPAREL, it is important for health care providers to know that you have received this anesthetic. A teal colored band has been placed on your arm with the date, time and amount of EXPAREL you have received in order to alert and inform  your health care providers. Please leave this armband in place for the full 96 hours following administration, and then you may remove the band.   About my Jackson-Pratt Bulb Drain  What is a Jackson-Pratt bulb? A Jackson-Pratt is a soft, round device used to collect drainage. It is connected to a long, thin drainage catheter, which is held in place by one or two small stiches near your surgical incision site. When the bulb is squeezed, it forms a vacuum, forcing the drainage to empty into the bulb.  Emptying the Jackson-Pratt bulb- To empty the bulb: 1. Release the plug on the top of the bulb. 2. Pour the bulb's contents into a measuring container which your nurse will provide. 3. Record the time emptied and amount of drainage. Empty the drain(s) as often as your     doctor or nurse recommends.  Date                  Time                    Amount (Drain 1)                 Amount (Drain 2)  _______________________________________________________________________________________  _______________________________________________________________________________________  _______________________________________________________________________________________  _______________________________________________________________________________________  _______________________________________________________________________________________  _______________________________________________________________________________________  _______________________________________________________________________________________  _______________________________________________________________________________________  Squeezing the Jackson-Pratt Bulb- To squeeze the bulb: 1. Make sure the plug at the top of the bulb is open. 2. Squeeze the bulb tightly in your fist. You will hear air squeezing from the bulb. 3. Replace the plug while the bulb is squeezed. 4. Use a safety pin to attach the  bulb to your  clothing. This will keep the catheter from     pulling at the bulb insertion site.  When to call your doctor- Call your doctor if: Drain site becomes red, swollen or hot. You have a fever greater than 101 degrees F. There is oozing at the drain site. Drain falls out (apply a guaze bandage over the drain hole and secure it with tape). Drainage increases daily not related to activity patterns. (You will usually have more drainage when you are active than when you are resting.) Drainage has a bad odor.

## 2020-12-12 ENCOUNTER — Encounter (HOSPITAL_BASED_OUTPATIENT_CLINIC_OR_DEPARTMENT_OTHER): Payer: Self-pay | Admitting: General Surgery

## 2020-12-12 DIAGNOSIS — C50412 Malignant neoplasm of upper-outer quadrant of left female breast: Secondary | ICD-10-CM | POA: Diagnosis not present

## 2020-12-12 MED ORDER — METHOCARBAMOL 500 MG PO TABS: 500.0000 mg | ORAL_TABLET | Freq: Three times a day (TID) | ORAL | 0 refills | Status: DC | PRN

## 2020-12-12 MED ORDER — ONDANSETRON HCL 4 MG PO TABS: 4.0000 mg | ORAL_TABLET | Freq: Three times a day (TID) | ORAL | 0 refills | Status: DC | PRN

## 2020-12-12 MED ORDER — HYDROMORPHONE HCL 2 MG PO TABS: 1.0000 mg | ORAL_TABLET | ORAL | 0 refills | Status: DC | PRN

## 2020-12-12 NOTE — Discharge Summary (Signed)
Physician Discharge Summary  Patient ID: Tammy Fowler MRN: [id number] DOB/AGE: [birth year] 70 y.o.  Admit date: 12/11/2020 Discharge date: 12/12/2020  Admission Diagnoses:  Discharge Diagnoses:  Active Problems:   Breast cancer [facility])   Discharged Condition: good  Hospital Course: Tolerated procedure  Consults: None  Significant Diagnostic Studies: none  Treatments: surgery: breast recon  Discharge Exam: Blood pressure 117/70, pulse 83, temperature 98.9 F (37.2 C), resp. rate 18, height '5\' 2"'$  (1.575 m), weight 60.1 kg, SpO2 100 %. General appearance: alert, cooperative, and no distress  Disposition: Discharge disposition: 01-Home or Self Care       Discharge Instructions     Diet - low sodium heart healthy   Complete by: As directed    Increase activity slowly   Complete by: As directed         Signed: [name] 12/12/2020, 8:42 AM

## 2020-12-12 NOTE — Addendum Note (Signed)
Addendum  created 12/12/20 1338 by Tawni Millers, CRNA   Charge Capture section accepted

## 2020-12-12 NOTE — Progress Notes (Signed)
Patient ID: Tammy Fowler, female   DOB: 02-Jun-1950, 70 y.o.   MRN: [id number] Doing well, drains as expected, plan for dc today

## 2020-12-14 LAB — SURGICAL PATHOLOGY

## 2020-12-19 ENCOUNTER — Ambulatory Visit (INDEPENDENT_AMBULATORY_CARE_PROVIDER_SITE_OTHER): Payer: Medicare Other | Admitting: Plastic Surgery

## 2020-12-19 ENCOUNTER — Other Ambulatory Visit: Payer: Self-pay

## 2020-12-19 DIAGNOSIS — Z170 Estrogen receptor positive status [ER+]: Secondary | ICD-10-CM

## 2020-12-19 DIAGNOSIS — C50912 Malignant neoplasm of unspecified site of left female breast: Secondary | ICD-10-CM

## 2020-12-19 NOTE — Progress Notes (Signed)
Patient presents 1 week out from bilateral nipple sparing mastectomies and immediate reconstruction.  She feels good overall.  On exam everything looks to be doing well.  Incisions are healing fine.  Drains are putting out less than 30 cc a day for for 5 days so they were both removed.  Nipple areolar complexes look to be viable on both sides with no signs of any complications in those areas.  We will plan to give this a few more weeks to heal and settle out and will likely start expansion 2 weeks from now.  All of her questions were answered.

## 2020-12-21 ENCOUNTER — Other Ambulatory Visit: Payer: Self-pay | Admitting: Oncology

## 2020-12-21 ENCOUNTER — Telehealth: Payer: Self-pay | Admitting: Oncology

## 2020-12-21 DIAGNOSIS — C50919 Malignant neoplasm of unspecified site of unspecified female breast: Secondary | ICD-10-CM

## 2020-12-21 NOTE — Telephone Encounter (Signed)
Patient requested Appt to discuss her treatments.  Appt made for 8/25 Follow Up 1:30 pm

## 2020-12-24 ENCOUNTER — Telehealth: Payer: Self-pay

## 2020-12-24 NOTE — Telephone Encounter (Signed)
After last treatment , patient knee became painful and hard to walk on. Getting worse instead of better. Gabapentin does not even help. Patient wants an Orthopedic referral. Orthopedic specialty in [place]: Dr. Myriam Jacobson.

## 2020-12-25 ENCOUNTER — Other Ambulatory Visit: Payer: Self-pay | Admitting: Hematology and Oncology

## 2020-12-25 DIAGNOSIS — M25569 Pain in unspecified knee: Secondary | ICD-10-CM

## 2020-12-27 ENCOUNTER — Other Ambulatory Visit: Payer: Self-pay

## 2020-12-27 DIAGNOSIS — C50011 Malignant neoplasm of nipple and areola, right female breast: Secondary | ICD-10-CM

## 2020-12-27 DIAGNOSIS — C50919 Malignant neoplasm of unspecified site of unspecified female breast: Secondary | ICD-10-CM

## 2020-12-27 DIAGNOSIS — C50912 Malignant neoplasm of unspecified site of left female breast: Secondary | ICD-10-CM

## 2020-12-27 DIAGNOSIS — M810 Age-related osteoporosis without current pathological fracture: Secondary | ICD-10-CM

## 2020-12-27 NOTE — Progress Notes (Signed)
error 

## 2020-12-31 ENCOUNTER — Encounter: Payer: Self-pay | Admitting: Physical Therapy

## 2020-12-31 ENCOUNTER — Ambulatory Visit: Payer: Medicare Other | Attending: General Surgery | Admitting: Physical Therapy

## 2020-12-31 ENCOUNTER — Other Ambulatory Visit: Payer: Self-pay

## 2020-12-31 DIAGNOSIS — M6281 Muscle weakness (generalized): Secondary | ICD-10-CM | POA: Diagnosis present

## 2020-12-31 DIAGNOSIS — R262 Difficulty in walking, not elsewhere classified: Secondary | ICD-10-CM

## 2020-12-31 DIAGNOSIS — Z483 Aftercare following surgery for neoplasm: Secondary | ICD-10-CM | POA: Diagnosis present

## 2020-12-31 DIAGNOSIS — M25612 Stiffness of left shoulder, not elsewhere classified: Secondary | ICD-10-CM

## 2020-12-31 DIAGNOSIS — R293 Abnormal posture: Secondary | ICD-10-CM | POA: Diagnosis present

## 2020-12-31 DIAGNOSIS — M25611 Stiffness of right shoulder, not elsewhere classified: Secondary | ICD-10-CM | POA: Diagnosis present

## 2020-12-31 NOTE — Therapy (Signed)
[place], [place], [address] Phone: [phone]   Fax:  [phone]  Physical Therapy Evaluation  Patient Details  Name: Tammy Fowler MRN: [id number] Date of Birth: 1951/05/12 Referring Provider (PT): Dr. Donne Hazel   Encounter Date: 12/31/2020   PT End of Session - 12/31/20 1547     Visit Number 1    Number of Visits 9    Date for PT Re-Evaluation 01/28/21    PT Start Time 0762    PT Stop Time 1540    PT Time Calculation (min) 34 min    Activity Tolerance Patient tolerated treatment well    Behavior During Therapy [facility] for tasks assessed/performed             Past Medical History:  Diagnosis Date   Cancer ([place])    Depression    Diabetes mellitus without complication ([place])    Hyperlipidemia    Hypomagnesemia 08/10/2020   Sleep apnea     Past Surgical History:  Procedure Laterality Date   ABDOMINAL HYSTERECTOMY     BREAST RECONSTRUCTION WITH PLACEMENT OF TISSUE EXPANDER AND FLEX HD (ACELLULAR HYDRATED DERMIS) Bilateral 12/11/2020   Procedure: BILATERAL BREAST RECONSTRUCTION WITH PLACEMENT OF TISSUE EXPANDER AND FLEX HD (ACELLULAR HYDRATED DERMIS);  Surgeon: [name], MD;  Location: [place];  Service: Plastics;  Laterality: Bilateral;   BREAST SURGERY Left    HYSTERECTOMY ABDOMINAL WITH SALPINGO-OOPHORECTOMY  07/2003   due to fibroids   NIPPLE SPARING MASTECTOMY Bilateral 12/11/2020   Procedure: BILATERAL NIPPLE SPARING MASTECTOMY;  Surgeon: [name], MD;  Location: [place];  Service: General;  Laterality: Bilateral;   PORTACATH PLACEMENT Right 06/26/2020   Procedure: INSERTION PORT-A-CATH;  Surgeon: [name], MD;  Location: [place];  Service: General;  Laterality: Right;   TRIGGER FINGER RELEASE      There were no vitals filed for this visit.    Subjective Assessment - 12/31/20 1507     Subjective The  plastic surgeon said not to lift straight up high over my head. Dr. Claudia Desanctis is my plastic surgeon. I am not to lift things over 10 pounds.  I got my last drain out 2 weeks ago.    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 No/denies    Pain Score 0-No pain                [facility] PT Assessment - 12/31/20 0001       Assessment   Medical Diagnosis L breast cancer    Referring Provider (PT) Dr. Donne Hazel    Onset Date/Surgical Date 12/11/20    Hand Dominance Right    Prior Therapy none      Precautions   Precautions Other (comment)    Precaution Comments at risk of lymphedema      Restrictions   Weight Bearing Restrictions No      Balance Screen   Has the patient fallen in the past 6 months Yes    How many times? 2   from chemo;  pt was dehydrated   Has the patient had a decrease in activity level because of a fear of falling?  Yes   due to chemo causing severe joint pain in knees   Is the patient reluctant to leave their home because of a fear of falling?  Yes      [place] residence    Living Arrangements Spouse/significant other    Available Help at Discharge Family    Type of [place]      Prior Function   Level of [place] Retired    Leisure unable to exercise due to knee pain      Cognition   Overall Cognitive Status Within Functional Limits for tasks assessed      Posture/Postural Control   Posture/Postural Control Postural limitations    Postural Limitations Rounded Shoulders;Forward head       AROM   Right Shoulder Extension 71 Degrees    Right Shoulder Flexion 144 Degrees    Right Shoulder ABduction 126 Degrees    Right Shoulder Internal Rotation 69 Degrees    Right Shoulder External Rotation 90 Degrees    Left Shoulder Extension 60 Degrees    Left Shoulder Flexion 142 Degrees    Left Shoulder ABduction 110 Degrees    Left Shoulder Internal Rotation 69 Degrees    Left Shoulder External Rotation 81 Degrees               LYMPHEDEMA/ONCOLOGY QUESTIONNAIRE - 12/31/20 0001       Right Upper Extremity Lymphedema   15 cm Proximal to Olecranon Process 26 cm    Olecranon Process 23.5 cm    15 cm Proximal to Ulnar Styloid Process 21.8 cm    Just Proximal to Ulnar Styloid Process 14.8 cm    Across Hand at [employer] 18.4 cm    At [place] of 2nd Digit 6.5 cm      Left Upper Extremity Lymphedema   15 cm Proximal to Olecranon Process 25.9 cm    Olecranon Process 23.2 cm    15 cm Proximal to Ulnar Styloid Process 20.9 cm    Just Proximal to Ulnar Styloid Process 14 cm    Across Hand at [employer] 17.3 cm    At [place] of 2nd Digit 6 cm                     Objective measurements completed on examination: See above findings.                    PT Long Term Goals - 12/31/20 1554       PT LONG TERM GOAL #1   Title Pt will demonstrate 165 degrees of bilateral shoulder flexion to allow her to reach overhead.    Baseline R 144, L 142    Time 4    Period Weeks    Status New    Target Date 01/28/21      PT LONG TERM GOAL #2   Title Pt will demonstrate 165 degrees of bilateral shoulder abduction to allow her to reach out to the side.    Baseline R 126, L 110    Time 4    Period Weeks    Status New    Target Date 01/28/21      PT LONG TERM GOAL #3   Title Pt will be independent in a home exercise program for continued strengthening  and stretching.    Time 4    Period Weeks    Status New    Target Date 01/28/21                     Plan - 12/31/20 1548     Clinical Impression Statement Pt reports to PT after undergoing a recent bilateral mastectomy with expander placement for treatment of L breast cancer which was a recurrence on 12/11/20. She had L breast cancer previously in 2004 and underwent a L lumpectomy and ALND (17 nodes - some positive) with lat flap reconstruction and R breast lift. She underwent chemo and radiation at that time and is currently undergoing radiation. Pt has decreased bilateral shoulder ROM and is still under precautions not to lift arms above shoulder height. She also feels weak and has a fear of falling that keeps her from leaving her home. She states the chemo affects her knees and causes so much pain that she is afraid she will fall. Pt would benefit from skilled PT services to improve bilateral shoulder ROM once cleared from plastic surgeon and eventually work on LE strengthening and balance to decrease fall risk.    Personal Factors and Comorbidities Fitness;Comorbidity 1    Comorbidities previous hx of breast cancer and chemo and radiaiton    Examination-Activity Limitations Lift;Carry;Reach Overhead    Stability/Clinical Decision Making Stable/Uncomplicated    Clinical Decision Making Low    Rehab Potential Good    PT Frequency 2x / week    PT Duration 4 weeks    PT Treatment/Interventions ADLs/Self Care Home Management;Therapeutic activities;Therapeutic exercise;Balance training;Neuromuscular re-education;Patient/family education;Manual techniques;Manual lymph drainage;Scar mobilization;Passive range of motion;Taping    PT Next Visit Plan once pt is cleared by plastic surgeon begin bilateral shoulder ROM in all directions, issue supine dowel exercises, MLD if plastic surgeon says that area anterior to axilla is swelling vs tissue, once bilateral shoulder ROM is full then assess LE strength and balance and add goals    Consulted and Agree with Plan of Care Patient              Patient will benefit from skilled therapeutic intervention in order to improve the following deficits and impairments:  Postural dysfunction, Impaired UE functional use, Impaired flexibility, Increased fascial restricitons, Decreased strength, Decreased activity tolerance, Decreased range of motion, Decreased scar mobility, Increased edema, Decreased balance, Decreased endurance, Decreased mobility, Difficulty walking  Visit Diagnosis: Stiffness of left shoulder, not elsewhere classified  Stiffness of right shoulder, not elsewhere classified  Aftercare following surgery for neoplasm  Abnormal posture  Muscle weakness (generalized)  Difficulty in walking, not elsewhere classified     Problem List Patient Active Problem List   Diagnosis Date Noted   Breast cancer ([place]) 12/11/2020   Nausea without vomiting 10/24/2020   Hypokalemia 08/23/2020   Dehydration 08/23/2020   Hypomagnesemia 08/10/2020   Local recurrence of cancer of left breast ([place]) 06/08/2020    Class: Diagnosis of   Osteoporosis 06/04/2020    Class: Chronic   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

## 2021-01-01 ENCOUNTER — Ambulatory Visit (INDEPENDENT_AMBULATORY_CARE_PROVIDER_SITE_OTHER): Payer: Medicare Other | Admitting: Surgical

## 2021-01-01 ENCOUNTER — Encounter: Payer: Self-pay | Admitting: Surgical

## 2021-01-01 DIAGNOSIS — Z170 Estrogen receptor positive status [ER+]: Secondary | ICD-10-CM

## 2021-01-01 DIAGNOSIS — C50912 Malignant neoplasm of unspecified site of left female breast: Secondary | ICD-10-CM

## 2021-01-01 NOTE — Progress Notes (Signed)
Patient is a 70 year old female here for follow-up after bilateral nipple sparing mastectomy with immediate breast reconstruction and placement of tissue expanders with Dr. Claudia Desanctis on 12/11/2020.  She has a history of left latissimus flap.  She also has a history of a right-sided reduction/mastopexy at the same time as the previous left breast reconstruction in 2004.  Patient presents today with her husband, she reports overall she is doing well.  She has some tenderness and reports she recently refilled her pain medicine prescription.  She is not complaining of any infectious symptoms.  No fevers or chills.  She has been going to physical therapy.  Chaperone present on exam On exam bilateral breast incisions are intact, bilateral NAC's are viable.  Latissimus flap paddle with good cap refill.  No wounds are noted.  She had some small subcutaneous fluid collections noted with palpation.  15 cc of serosanguineous fluid was aspirated from the left breast, 10 cc of serosanguineous fluid was aspirated from the right breast using sterile techniques.  We placed injectable saline in the Expander using a sterile technique: Right: 50 cc for a total of 150 / 300 cc Left: 50 cc for a total of 150 / 300 cc  Recommend following up in 2 weeks for reevaluation.

## 2021-01-03 NOTE — Progress Notes (Signed)
[facility]  [address] [place],  [place]  [address] [phone]  Clinic Day:  01/10/2021  Referring physician: Delphina Cahill, FNP  This document serves as a record of services personally performed by [name], MD. It was created on their behalf by Curry,Lauren E, a trained medical scribe. The creation of this record is based on the scribe's personal observations and the provider's statements to them.  CHIEF COMPLAINT:  CC:  History of hormone and HER2/neu receptor positive breast cancer   Current Treatment:   Neoadjuvant docetaxel/carboplatin/trastuzumab/pertuzumab (TCHP)  HISTORY OF PRESENT ILLNESS:  Izabelle Fredi Hurtado is a 70 y.o. 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 Excisional biopsy revealed a 2 cm, grade 2, infiltrating ductal carcinoma in the left lower quadrant.  Estrogen and progesterone receptors were positive and her 2 Neu negative.  Tumor was present at the margins, so she underwent re-excision and axillary dissection.  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  Four of sixteen lymph nodes were positive for metastasis.  All surgical margins were free of tumor involvement.  She received adjuvant chemotherapy with with dose dense Adriamycin and Cytoxan for 4 cycles, followed by dose dense Taxol for 4 cycles, follwed by adjuvant radiation of the left breast.  She was placed on anastrozole 1 mg daily in April 2005 and completed 10 years of therapy in April 2015.  She underwent left latissimus dorsi muscle reconstruction of the left breast and right breast reduction.  She underwent total abdominal hysterectomy and bilateral salpingo-oophorectomy prior to starting anastrozole.  She had excision of a benign mass from the left breast in 2009.  She underwent testing for BRCA mutations, including BART, in April 2013,  which was negative, however, we did not have the results.  She has a history of osteoporosis, previously treated with zolendronic acid every 6 months, which was discontinued when she completed anastrozole.     We began seeing her in July 2014, when she relocated to this area.  Bone density scan in December 2015 was normal.  Her personal and family history wass suggestive of a hereditary cancer syndrome, so we recommended additional genetic testing with the Myriad myRisk Update Panel test to look for a mutation in other genes known to increase the risk for breast cancer and as well as other cancers.  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  These results were reviewed with her in detail and she was scheduled for an breast MRI which did not reveal any evidence of malignancy. Bone density in January 2019 was normal.  Breast MRI in August 2019 did not reveal any evidence of malignancy  She developed diabetes and had an MRI abdomen since pancreatic cancer is increased with the ATM mutation, which was negative.  She  had colonoscopy in early 2020 by Dr. Marcell Anger in [place] and had 2 polyps removed. Repeat colonoscopy in 5 years was recommended. Bilateral screening mammogram in March 2020 and April 2021 did not reveal any evidence of malignancy.   She was seen in January 2022 with a new palpable mass in the left breast.  Diagnostic left mammogram revealed a suspicious mass measuring 2.0 cm at 10 o'clock. Diagnostic  right mammogram did not reveal any evidence of malignancy.  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  Estrogen receptor was 30% positive and progesterone receptor negative and HER2 receptor positive.  Ki67 was 25%.   However, there was no breast tissue in the sample, so this was felt to be more likely a recurrence rather than a new breast primary, even though it has been many years. But her previous cancer was HER2 negative and ER positive.    She started treatment with [facility] on February 15th.  She experienced decreased appetite, taste changes, mouth sores, and diarrhea despite imodium. She was given a prescription for Lomotil to better control her diarrhea. MBX has been effective for her mouth sores  She also had heartburn for which omeprazole has been effective.  She received a 3rd cycle of TCHP on March 30th.  At that time she continued to report diarrhea, but was not alternating Lomotil and Imodium. She has continued to lose weight, as she has not been eating well due to taste changes and decreased appetite.  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 After her 4th cycle of TCHP she presented to clinic with uncontrolled nausea and vomiting, as well dizziness and blurry vision. MRI brain did not reveal any intracranial metastasis.  She was given IV fluids and placed on lorazepam 0.5 mg every 6 hours as needed for nausea, vomiting and anxiety.  She was also placed on dronabinol 5 mg at bedtime for nausea, vomiting and appetite.  At her visit on May 6th, her side effects were fairly well controlled and she had gained 5 lb  She proceeded with a 5th cycle of TCHP at 25% dose reduction on May 11th. After her last dose of Herceptin/Perjeta she had severe bilateral knee pain which has persisted.  INTERVAL HISTORY:  [place] is here for follow up after undergoing bilateral mastectomy and reconstruction on July 26th with Dr. Donne Hazel and Dr. Claudia Desanctis.  Final pathology revealed residual carcinoma of the left breast status post neoadjuvant therapy, measuring 1.5 cm.  Margins uninvolved by carcinoma.  In the right breast, no malignancy was  identified. She is healing well.  She is taking ibuprofen 800 mg BID as well as Tylenol two pills BID for her knee arthralgias, but continues to have moderate to severe pain.  I will try her on Mobic 15 mg daily.  Her pain does affect her ambulation.  She is hesitant to continue there HER2 directed therapy.  I have explained the benefits and the fact that she has 11 more treatments to go to complete 1 full year.  Otherwise, she denies complaints.  Her  appetite is good, and she has gained 6 pounds since her last visit.  She denies fever, chills or other signs of infection.  She denies nausea, vomiting, bowel issues, or abdominal pain.  She denies sore throat, cough, dyspnea, or chest pain.  REVIEW OF SYSTEMS:  Review of Systems  Constitutional: Negative.  Negative for appetite change, chills, fatigue, fever and unexpected weight change.  HENT:  Negative.    Eyes: Negative.   Respiratory: Negative.  Negative for chest tightness, cough, hemoptysis, shortness of breath and wheezing.   Cardiovascular: Negative.  Negative for chest pain, leg swelling and palpitations.  Gastrointestinal: Negative.  Negative for abdominal distention, abdominal pain, blood in stool, constipation,  diarrhea, nausea and vomiting.  Endocrine: Negative.   Genitourinary: Negative.  Negative for difficulty urinating, dysuria, frequency and hematuria.   Musculoskeletal:  Positive for arthralgias (significant of the bilateral knees). Negative for back pain, flank pain, gait problem and myalgias.  Skin: Negative.   Neurological: Negative.  Negative for dizziness, extremity weakness, gait problem, headaches, light-headedness, numbness, seizures and speech difficulty.  Hematological: Negative.   Psychiatric/Behavioral: Negative.  Negative for depression and sleep disturbance. The patient is not nervous/anxious.     VITALS:  Blood pressure 133/68, pulse 84, temperature 98.3 F (36.8 C), temperature source Oral, resp. rate 16, height  5' 2"  (1.575 m), weight 127 lb 1.9 oz (57.7 kg), SpO2 100 %.    Wt Readings from Last 3 Encounters:  01/10/21 127 lb 1.9 oz (57.7 kg)  12/11/20 132 lb 7.9 oz (60.1 kg)  11/27/20 127 lb 12.8 oz (58 kg)    Body mass index is 23.25 kg/m.  Performance status (ECOG): 1 - Symptomatic but completely ambulatory  PHYSICAL EXAM:  Physical Exam Constitutional:      General: She is not in acute distress.    Appearance: Normal appearance. She is normal weight.  HENT:     Head: Normocephalic and atraumatic.  Eyes:     General: No scleral icterus.    Extraocular Movements: Extraocular movements intact.     Conjunctiva/sclera: Conjunctivae normal.     Pupils: Pupils are equal, round, and reactive to light.  Cardiovascular:     Rate and Rhythm: Normal rate and regular rhythm.     Pulses: Normal pulses.     Heart sounds: Normal heart sounds. No murmur heard.   No friction rub. No gallop.  Pulmonary:     Effort: Pulmonary effort is normal. No respiratory distress.     Breath sounds: Normal breath sounds.  Abdominal:     General: Bowel sounds are normal. There is no distension.     Palpations: Abdomen is soft. There is no hepatomegaly, splenomegaly or mass.     Tenderness: There is no abdominal tenderness.  Musculoskeletal:        General: Normal range of motion.     Cervical back: Normal range of motion and neck supple.     Right lower leg: No edema.     Left lower leg: No edema.  Lymphadenopathy:     Cervical: No cervical adenopathy.  Skin:    General: Skin is warm and dry.  Neurological:     General: No focal deficit present.     Mental Status: She is alert and oriented to person, place, and time. Mental status is at baseline.  Psychiatric:        Mood and Affect: Mood normal.        Behavior: Behavior normal.        Thought Content: Thought content normal.        Judgment: Judgment normal.  Bilateral reconstructions are healing well.  The implants are somewhat puckered due to not  being fully expanded.   LABS:   CBC Latest Ref Rng & Units 11/27/2020 11/06/2020 10/16/2020  WBC - 4.5 5.0 4.7  Hemoglobin 12.0 - 16.0 10.8(A) 9.3(A) 10.1(A)  Hematocrit 36 - 46 33(A) 28(A) 30(A)  Platelets 150 - 399 276 283 203   CMP Latest Ref Rng & Units 11/27/2020 11/06/2020 10/16/2020  Glucose 70 - 99 mg/dL - - -  BUN 4 - 21 19 12 13   Creatinine 0.5 - 1.1 0.5 0.5 0.5  Sodium 137 -  147 138 141 138  Potassium 3.4 - 5.3 4.7 3.9 3.5  Chloride 99 - 108 101 104 102  CO2 13 - 22 30(A) 32(A) 32(A)  Calcium 8.7 - 10.7 9.5 9.2 9.3  Alkaline Phos 25 - 125 84 70 79  AST 13 - 35 28 25 32  ALT 7 - 35 21 17 31     STUDIES:  No results found.   SURGICAL PATHOLOGY  CASE: [phone]  PATIENT: Romie Levee  Surgical Pathology Report   Clinical History: recurrent breast cancer (cm)    FINAL MICROSCOPIC DIAGNOSIS:   A. BREAST, RIGHT, MASTECTOMY:  -  Fibroadenomatoid and fibrocystic changes with calcifications  -  No carcinoma identified   B. NIPPLE, RIGHT BREAST, BIOPSY:  -  Benign f ducts  -  No carcinoma identified   C. BREAST, LEFT, MASTECTOMY:  -  Residual carcinoma status post neoadjuvant therapy  -  Margins uninvolved by carcinoma (<0.1 cm; anterior margin)  -  Previous biopsy site changes present  -  See oncology table below   D. NIPPLE, LEFT BREAST, BIOPSY:  -  No carcinoma identified   E. BREAST, LEFT ANTERIOR UPPER INNER QUADRANT, EXCISION:  -  No carcinoma identified   ONCOLOGY TABLE:   INVASIVE CARCINOMA OF THE BREAST, STATUS POST NEOADJUVANT TREATMENT:  Resection   Procedure: Bilateral mastectomies  Specimen Laterality: Right  Histologic Type: Invasive carcinoma of no special type (ductal, NOS)  Histologic Grade:       Glandular (Acinar)/Tubular Differentiation: Score 3       Nuclear Pleomorphism: Score 2       Mitotic Rate: Score 2       Overall Grade: Nottingham grade 2  Tumor Size: 1.5  Ductal Carcinoma In Situ: Not identified  Tumor Extent: Limited  to breast parenchyma  Treatment Effect in the Breast: Probable or definite response to  presurgical therapy in invasive carcinoma  Treatment Effect in Lymph Nodes: No lymph nodes submitted  Margins: All margins negative for invasive carcinoma       Distance from Closest Margin (mm): Less than 1       Specify Closest Margin (required only if <24m): Anterior  Regional Lymph Nodes: Not applicable (no lymph nodes submitted or found)  Distant Metastasis:       Distant Site(s) Involved: Not applicable  Breast Prognostic Profile (Pre-neoadjuvant case #: [id number]       Estrogen Receptor: POSITIVE, 30% WEAK       Progesterone Receptor: NEGATIVE       Her2: POSITIVE (FISH, group 1)       Ki-67: 25%       Will be repeated on the current case (Block #: C4) and the results  reported in an addendum.  Residual Cancer Burden (RCB):       Primary Tumor Bed: 15 mm x 15 mm       Overall Cancer Cellularity: 70       Percentage of Cancer that is in Situ: 0       Number of Positive Lymph Nodes: 0       Diameter of Largest Lymph Node metastasis: N/A       Residual Cancer Burden: 2.088       Residual Cancer Burden Class: RCB-II  Pathologic Stage Classification (pTNM, AJCC 8th Edition): yrpT1c, ypN  not assigned  Representative Tumor Block: C4  Comment(s): None   (v4.5.0.0)   HISTORY:   Allergies:  Allergies  Allergen Reactions   Oxycodone Itching   Codeine  Other (See Comments)    Other reaction(s): Other (See Comments) Unknown Unknown    Lisinopril     Other reaction(s): Cough (ALLERGY/intolerance)    Current Medications: Current Outpatient Medications  Medication Sig Dispense Refill   ACCU-CHEK GUIDE test strip daily. for testing as directed     aspirin 81 MG chewable tablet Chew by mouth.     benzonatate (TESSALON) 100 MG capsule      calcium-vitamin D 250-100 MG-UNIT tablet Take by mouth.     dexamethasone (DECADRON) 4 MG tablet Take 2 tablets (8 mg total) by mouth 2 (two) times  daily. Start the day before Taxotere. Then take daily x 3 days after chemotherapy. (Patient taking differently: Take 4 mg by mouth 2 (two) times daily. Start the day before Taxotere. Then take daily x 3 days after chemotherapy.) 30 tablet 1   diphenoxylate-atropine (LOMOTIL) 2.5-0.025 MG tablet Take 2 tablets by mouth 4 (four) times daily as needed for diarrhea or loose stools. 60 tablet 3   Docusate Sodium (DSS) 100 MG CAPS Take by mouth.     dronabinol (MARINOL) 5 MG capsule Take 1 capsule (5 mg total) by mouth at bedtime. 30 capsule 2   DULoxetine (CYMBALTA) 60 MG capsule Take 1 capsule by mouth daily.     FLUoxetine (PROZAC) 40 MG capsule Take by mouth.     fluticasone (FLONASE) 50 MCG/ACT nasal spray      gabapentin (NEURONTIN) 300 MG capsule TAKE 1 CAPSULE BY MOUTH THREE TIMES A DAY 90 capsule 0   HYDROcodone-acetaminophen (NORCO) 7.5-325 MG tablet TAKE 1 TABLET BY MOUTH EVERY 4 HOURS AS NEEDED FOR MODERATE PAIN 120 tablet 0   HYDROmorphone (DILAUDID) 2 MG tablet Take 0.5 tablets (1 mg total) by mouth every 4 (four) hours as needed for severe pain. 25 tablet 0   lidocaine-prilocaine (EMLA) cream Apply to affected area once 30 g 3   LORazepam (ATIVAN) 0.5 MG tablet Take 1 tablet (0.5 mg total) by mouth every 6 (six) hours as needed for anxiety. 90 tablet 0   losartan (COZAAR) 50 MG tablet daily.     magic mouthwash (nystatin, lidocaine, diphenhydrAMINE) suspension Take 5 mLs by mouth every 3 (three) hours as needed for mouth pain. Swish & Spit; 229m bottle [facility] 1305 per Melissa P,NP     magnesium oxide (MAG-OX) 400 (241.3 Mg) MG tablet Take 1 tablet (400 mg total) by mouth 2 (two) times daily. 60 tablet 1   meclizine (ANTIVERT) 25 MG tablet Take 1 tablet (25 mg total) by mouth 3 (three) times daily as needed for dizziness. 30 tablet 0   meloxicam (MOBIC) 15 MG tablet Take 1 tablet (15 mg total) by mouth daily. 30 tablet 5   metFORMIN (GLUCOPHAGE) 500 MG  tablet 2 (two) times daily.     methocarbamol (ROBAXIN) 500 MG tablet Take 1 tablet (500 mg total) by mouth every 8 (eight) hours as needed for muscle spasms. 30 tablet 0   Multiple Vitamins-Minerals (THERA-M) TABS Take by mouth.     ondansetron (ZOFRAN) 4 MG tablet Take 1-2 tablets (4-8 mg total) by mouth every 8 (eight) hours as needed for nausea or vomiting. 20 tablet 0   ondansetron (ZOFRAN) 8 MG tablet Take 1 tablet (8 mg total) by mouth 2 (two) times daily as needed (Nausea or vomiting). Start on the third day after chemotherapy. 30 tablet 1   prochlorperazine (COMPAZINE) 10 MG tablet Take 1 tablet (10 mg total) by  mouth every 6 (six) hours as needed (Nausea or vomiting). 30 tablet 1   rosuvastatin (CRESTOR) 40 MG tablet daily.     sulfamethoxazole-trimethoprim (BACTRIM DS) 800-160 MG tablet Take 1 tablet by mouth 2 (two) times daily. 28 tablet 0   traMADol (ULTRAM) 50 MG tablet Take 1-2 tablets (50-100 mg total) by mouth every 6 (six) hours as needed. 60 tablet 0   vitamin B-12 (CYANOCOBALAMIN) 500 MCG tablet Take 500 mcg by mouth daily.     No current facility-administered medications for this visit.     ASSESSMENT & PLAN:   Assessment:  1.  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 This is felt to most likely represent a recurrence rather than a 2nd breast primary.  However, this is now HER 2 positive in addition to ER positive.  She completed 6 cycles of neoadjuvant TCHP in July.  After 4 cycles, the doses were reduced by 25%, but she still was experiencing adverse effects. She completed chemotherapy. She received one cycle of HER2 targeted therapy prior to reconstructive surgery. She tolerated her first maintenance HER2 therapy poorly with rash and severe knee pain.  She is due for 11  more doses and we are not sure we will be able to get her through this.    2.  Significant arthralgias, especially of the knees.  This is not well controlled with ibuprofen.  Therefore, I will try her on Mobic 15 mg daily.    Plan:     She underwent bilateral mastectomy and reconstruction and is healing well.  We reviewed whether to pursue further HER2 targeted therapy, and I explained that continuing this for a total of one year decreases her risk for recurrence by about half.  I assured her that they removed all visible disease during her bilateral mastectomy, but surgery does not cover microscopic disease.  She is willing to try HER2 targeted therapy again so we will schedule her for Monday or Tuesday.  As she continues to have moderate pain despite ibuprofen and Tylenol, I will try her on Mobic 15 mg daily.  We will see her back in 3 weeks to see how she tolerated therapy.  We will plan to repeat an ECHO next month.  She verbalizes understanding of and agreement to the plans discussed today. She knows to call the office should any new questions or concerns arise.    I, Rita Ohara, am acting as scribe for [name], MD  I have reviewed this report as typed by the medical scribe, and it is complete and accurate.  [name] MD [place] [facility]

## 2021-01-10 ENCOUNTER — Telehealth: Payer: Self-pay

## 2021-01-10 ENCOUNTER — Encounter: Payer: Self-pay | Admitting: Oncology

## 2021-01-10 ENCOUNTER — Inpatient Hospital Stay: Payer: Medicare Other | Attending: Oncology | Admitting: Oncology

## 2021-01-10 ENCOUNTER — Inpatient Hospital Stay: Payer: Medicare Other

## 2021-01-10 ENCOUNTER — Other Ambulatory Visit: Payer: Self-pay | Admitting: Plastic Surgery

## 2021-01-10 ENCOUNTER — Telehealth: Payer: Self-pay | Admitting: Oncology

## 2021-01-10 ENCOUNTER — Other Ambulatory Visit: Payer: Self-pay

## 2021-01-10 ENCOUNTER — Other Ambulatory Visit: Payer: Self-pay | Admitting: Oncology

## 2021-01-10 VITALS — BP 133/68 | HR 84 | Temp 98.3°F | Resp 16 | Ht 62.0 in | Wt 127.1 lb

## 2021-01-10 DIAGNOSIS — Z7984 Long term (current) use of oral hypoglycemic drugs: Secondary | ICD-10-CM | POA: Insufficient documentation

## 2021-01-10 DIAGNOSIS — Z5112 Encounter for antineoplastic immunotherapy: Secondary | ICD-10-CM | POA: Insufficient documentation

## 2021-01-10 DIAGNOSIS — C50919 Malignant neoplasm of unspecified site of unspecified female breast: Secondary | ICD-10-CM

## 2021-01-10 DIAGNOSIS — Z170 Estrogen receptor positive status [ER+]: Secondary | ICD-10-CM | POA: Insufficient documentation

## 2021-01-10 DIAGNOSIS — M810 Age-related osteoporosis without current pathological fracture: Secondary | ICD-10-CM | POA: Diagnosis not present

## 2021-01-10 DIAGNOSIS — C50912 Malignant neoplasm of unspecified site of left female breast: Secondary | ICD-10-CM | POA: Diagnosis not present

## 2021-01-10 DIAGNOSIS — C50212 Malignant neoplasm of upper-inner quadrant of left female breast: Secondary | ICD-10-CM | POA: Insufficient documentation

## 2021-01-10 DIAGNOSIS — M170 Bilateral primary osteoarthritis of knee: Secondary | ICD-10-CM

## 2021-01-10 DIAGNOSIS — Z79899 Other long term (current) drug therapy: Secondary | ICD-10-CM | POA: Insufficient documentation

## 2021-01-10 DIAGNOSIS — Z9013 Acquired absence of bilateral breasts and nipples: Secondary | ICD-10-CM | POA: Insufficient documentation

## 2021-01-10 DIAGNOSIS — Z7952 Long term (current) use of systemic steroids: Secondary | ICD-10-CM | POA: Insufficient documentation

## 2021-01-10 LAB — BASIC METABOLIC PANEL
BUN: 22 — AB (ref 4–21)
CO2: 25 — AB (ref 13–22)
Chloride: 102 (ref 99–108)
Creatinine: 0.6 (ref 0.5–1.1)
Glucose: 122
Potassium: 4.8 (ref 3.4–5.3)
Sodium: 138 (ref 137–147)

## 2021-01-10 LAB — CBC AND DIFFERENTIAL
HCT: 36 (ref 36–46)
Hemoglobin: 11.9 — AB (ref 12.0–16.0)
Neutrophils Absolute: 3.6
Platelets: 236 (ref 150–399)
WBC: 5.3

## 2021-01-10 LAB — HEPATIC FUNCTION PANEL
ALT: 16 (ref 7–35)
AST: 28 (ref 13–35)
Alkaline Phosphatase: 88 (ref 25–125)
Bilirubin, Total: 0.5

## 2021-01-10 LAB — COMPREHENSIVE METABOLIC PANEL
Albumin: 4.7 (ref 3.5–5.0)
Calcium: 10.2 (ref 8.7–10.7)

## 2021-01-10 LAB — CBC: RBC: 3.53 — AB (ref 3.87–5.11)

## 2021-01-10 MED ORDER — MELOXICAM 15 MG PO TABS: 15.0000 mg | ORAL_TABLET | Freq: Every day | ORAL | 5 refills | Status: DC

## 2021-01-10 NOTE — Telephone Encounter (Signed)
Returned patients call. Advised her that Dr. Claudia Desanctis was ok with her getting steroid shots. We do not perform shots in the knee at our practice. She will have to contact a orthopedic provider. Explain to her steroids slow down healing process. Surgery was on 12/11/2020 for BL breast reconstruction with tissue expanders and acellular dermal matrix with Dr. Claudia Desanctis. Patient understood and agreed with plan.

## 2021-01-10 NOTE — Telephone Encounter (Signed)
Patient called to say that she is having terrible knee pain and the specialist she saw said that Dr. Claudia Desanctis had to give the "ok" for her to have the shot.  She said that there is some concern that the healing process for her reconstruction would be in jeopardy if she had the shot too soon.  Patient would like to know if it's ok for her to have a cortisone shot and if so, is there anyone here in our practice or near here who could do this for her?  Please call.

## 2021-01-10 NOTE — Telephone Encounter (Signed)
Per 8/25 LOS, patient scheduled for Sept Appt's.  Gave patient Appt Summary

## 2021-01-11 ENCOUNTER — Other Ambulatory Visit: Payer: Self-pay | Admitting: Oncology

## 2021-01-11 MED FILL — Trastuzumab-anns For IV Soln 150 MG: INTRAVENOUS | Qty: 16 | Status: AC

## 2021-01-11 MED FILL — Pertuzumab Soln for IV Infusion 420 MG/14ML (30 MG/ML): INTRAVENOUS | Qty: 14 | Status: AC

## 2021-01-14 ENCOUNTER — Encounter: Payer: Self-pay | Admitting: Oncology

## 2021-01-14 ENCOUNTER — Inpatient Hospital Stay: Payer: Medicare Other

## 2021-01-14 ENCOUNTER — Other Ambulatory Visit: Payer: Self-pay

## 2021-01-14 VITALS — BP 136/65 | HR 78 | Temp 98.6°F | Resp 18 | Ht 62.0 in | Wt 135.0 lb

## 2021-01-14 DIAGNOSIS — Z170 Estrogen receptor positive status [ER+]: Secondary | ICD-10-CM | POA: Diagnosis not present

## 2021-01-14 DIAGNOSIS — Z7952 Long term (current) use of systemic steroids: Secondary | ICD-10-CM | POA: Diagnosis not present

## 2021-01-14 DIAGNOSIS — C50912 Malignant neoplasm of unspecified site of left female breast: Secondary | ICD-10-CM

## 2021-01-14 DIAGNOSIS — Z79899 Other long term (current) drug therapy: Secondary | ICD-10-CM | POA: Diagnosis not present

## 2021-01-14 DIAGNOSIS — Z7984 Long term (current) use of oral hypoglycemic drugs: Secondary | ICD-10-CM | POA: Diagnosis not present

## 2021-01-14 DIAGNOSIS — C50212 Malignant neoplasm of upper-inner quadrant of left female breast: Secondary | ICD-10-CM | POA: Diagnosis present

## 2021-01-14 DIAGNOSIS — Z5112 Encounter for antineoplastic immunotherapy: Secondary | ICD-10-CM | POA: Diagnosis not present

## 2021-01-14 DIAGNOSIS — Z9013 Acquired absence of bilateral breasts and nipples: Secondary | ICD-10-CM | POA: Diagnosis not present

## 2021-01-14 MED ORDER — TRASTUZUMAB-ANNS CHEMO 150 MG IV SOLR
6.0000 mg/kg | Freq: Once | INTRAVENOUS | Status: AC
  Administered 2021-01-14: 336 mg via INTRAVENOUS
  Filled 2021-01-14: qty 16

## 2021-01-14 MED ORDER — SODIUM CHLORIDE 0.9% FLUSH
10.0000 mL | INTRAVENOUS | Status: DC | PRN
  Administered 2021-01-14: 10 mL

## 2021-01-14 MED ORDER — SODIUM CHLORIDE 0.9 % IV SOLN: Freq: Once | INTRAVENOUS | Status: AC

## 2021-01-14 MED ORDER — SODIUM CHLORIDE 0.9 % IV SOLN
420.0000 mg | Freq: Once | INTRAVENOUS | Status: AC
  Administered 2021-01-14: 420 mg via INTRAVENOUS
  Filled 2021-01-14: qty 14

## 2021-01-14 MED ORDER — ACETAMINOPHEN 325 MG PO TABS: 650.0000 mg | ORAL_TABLET | Freq: Once | ORAL | Status: DC

## 2021-01-14 MED ORDER — HEPARIN SOD (PORK) LOCK FLUSH 100 UNIT/ML IV SOLN
500.0000 [IU] | Freq: Once | INTRAVENOUS | Status: AC | PRN
  Administered 2021-01-14: 500 [IU]

## 2021-01-14 MED ORDER — DIPHENHYDRAMINE HCL 25 MG PO CAPS
25.0000 mg | ORAL_CAPSULE | Freq: Once | ORAL | Status: DC
  Filled 2021-01-14: qty 1

## 2021-01-14 NOTE — Progress Notes (Unsigned)
Discharged home, stable  

## 2021-01-14 NOTE — Patient Instructions (Signed)
[facility]  Discharge Instructions: Thank you for choosing [facility] to provide your oncology and hematology care.  If you have a lab appointment with the [facility], please go directly to the [facility] and check in at the registration area.   Wear comfortable clothing and clothing appropriate for easy access to any Portacath or PICC line.   We strive to give you quality time with your provider. You may need to reschedule your appointment if you arrive late (15 or more minutes).  Arriving late affects you and other patients whose appointments are after yours.  Also, if you miss three or more appointments without notifying the office, you may be dismissed from the clinic at the provider's discretion.      For prescription refill requests, have your pharmacy contact our office and allow 72 hours for refills to be completed.    Today you received the following chemotherapy and/or immunotherapy agents     To help prevent nausea and vomiting after your treatment, we encourage you to take your nausea medication as directed.  BELOW ARE SYMPTOMS THAT SHOULD BE REPORTED IMMEDIATELY: *FEVER GREATER THAN 100.4 F (38 C) OR HIGHER *CHILLS OR SWEATING *NAUSEA AND VOMITING THAT IS NOT CONTROLLED WITH YOUR NAUSEA MEDICATION *UNUSUAL SHORTNESS OF BREATH *UNUSUAL BRUISING OR BLEEDING *URINARY PROBLEMS (pain or burning when urinating, or frequent urination) *BOWEL PROBLEMS (unusual diarrhea, constipation, pain near the anus) TENDERNESS IN MOUTH AND THROAT WITH OR WITHOUT PRESENCE OF ULCERS (sore throat, sores in mouth, or a toothache) UNUSUAL RASH, SWELLING OR PAIN  UNUSUAL VAGINAL DISCHARGE OR ITCHING   Items with * indicate a potential emergency and should be followed up as soon as possible or go to the Emergency Department if any problems should occur.  Please show the CHEMOTHERAPY ALERT CARD or IMMUNOTHERAPY ALERT CARD at check-in to the Emergency  Department and triage nurse.  Should you have questions after your visit or need to cancel or reschedule your appointment, please contact [facility]  Dept: [phone]  and follow the prompts.  Office hours are 8:00 a.m. to 4:30 p.m. Monday - Friday. Please note that voicemails left after 4:00 p.m. may not be returned until the following business day.  We are closed weekends and major holidays. You have access to a nurse at all times for urgent questions. Please call the main number to the clinic Dept: [phone] and follow the prompts.  For any non-urgent questions, you may also contact your provider using MyChart. We now offer e-Visits for anyone 19 and older to request care online for non-urgent symptoms. For details visit [email or web address].   Also download the MyChart app! Go to the app store, search "MyChart", open the app, select [facility], and log in with your MyChart username and password.  Due to Covid, a mask is required upon entering the hospital/clinic. If you do not have a mask, one will be given to you upon arrival. For doctor visits, patients may have 1 support person aged 30 or older with them. For treatment visits, patients cannot have anyone with them due to current Covid guidelines and our immunocompromised population.   Pertuzumab injection What is this medication? PERTUZUMAB (per TOOZ ue mab) is a monoclonal antibody. It is used to treatbreast cancer. This medicine may be used for other purposes; ask your health care provider orpharmacist if you have questions. COMMON BRAND NAME(S): PERJETA What should I tell my care team before I take  this medication? They need to know if you have any of these conditions: heart disease heart failure high blood pressure history of irregular heart beat recent or ongoing radiation therapy an unusual or allergic reaction to pertuzumab, other medicines, foods, dyes, or preservatives pregnant or trying to  get pregnant breast-feeding How should I use this medication? This medicine is for infusion into a vein. It is given by a health careprofessional in a hospital or clinic setting. Talk to your pediatrician regarding the use of this medicine in children.Special care may be needed. Overdosage: If you think you have taken too much of this medicine contact apoison control center or emergency room at once. NOTE: This medicine is only for you. Do not share this medicine with others. What if I miss a dose? It is important not to miss your dose. Call your doctor or health careprofessional if you are unable to keep an appointment. What may interact with this medication? Interactions are not expected. Give your health care provider a list of all the medicines, herbs, non-prescription drugs, or dietary supplements you use. Also tell them if you smoke, drink alcohol, or use illegal drugs. Some items may interact with yourmedicine. This list may not describe all possible interactions. Give your health care provider a list of all the medicines, herbs, non-prescription drugs, or dietary supplements you use. Also tell them if you smoke, drink alcohol, or use illegaldrugs. Some items may interact with your medicine. What should I watch for while using this medication? Your condition will be monitored carefully while you are receiving this medicine. Report any side effects. Continue your course of treatment eventhough you feel ill unless your doctor tells you to stop. Do not become pregnant while taking this medicine or for 7 months after stopping it. Women should inform their doctor if they wish to become pregnant or think they might be pregnant. Women of child-bearing potential will need to have a negative pregnancy test before starting this medicine. There is a potential for serious side effects to an unborn child. Talk to your health care professional or pharmacist for more information. Do not breast-feed an  infantwhile taking this medicine or for 7 months after stopping it. Women must use effective birth control with this medicine. Call your doctor or health care professional for advice if you get a fever, chills or sore throat, or other symptoms of a cold or flu. Do not treatyourself. Try to avoid being around people who are sick. You may experience fever, chills, and headache during the infusion. Report anyside effects during the infusion to your health care professional. What side effects may I notice from receiving this medication? Side effects that you should report to your doctor or health care professionalas soon as possible: breathing problems chest pain or palpitations dizziness feeling faint or lightheaded fever or chills skin rash, itching or hives sore throat swelling of the face, lips, or tongue swelling of the legs or ankles unusually weak or tired Side effects that usually do not require medical attention (report to yourdoctor or health care professional if they continue or are bothersome): diarrhea hair loss nausea, vomiting tiredness This list may not describe all possible side effects. Call your doctor for medical advice about side effects. You may report side effects to FDA at1-800-FDA-1088. Where should I keep my medication? This drug is given in a hospital or clinic and will not be stored at home. NOTE: This sheet is a summary. It may not cover all possible information. If you  have questions about this medicine, talk to your doctor, pharmacist, orhealth care provider.  2022 Elsevier/Gold Standard (2015-06-07 12:08:50) Trastuzumab injection for infusion What is this medication? TRASTUZUMAB (tras TOO zoo mab) is a monoclonal antibody. It is used to treatbreast cancer and stomach cancer. This medicine may be used for other purposes; ask your health care provider orpharmacist if you have questions. COMMON BRAND NAME(S): Herceptin, Janae Bridgeman, Ontruzant,  Trazimera What should I tell my care team before I take this medication? They need to know if you have any of these conditions: heart disease heart failure lung or breathing disease, like asthma an unusual or allergic reaction to trastuzumab, benzyl alcohol, or other medications, foods, dyes, or preservatives pregnant or trying to get pregnant breast-feeding How should I use this medication? This drug is given as an infusion into a vein. It is administered in a hospitalor clinic by a specially trained health care professional. Talk to your pediatrician regarding the use of this medicine in children. Thismedicine is not approved for use in children. Overdosage: If you think you have taken too much of this medicine contact apoison control center or emergency room at once. NOTE: This medicine is only for you. Do not share this medicine with others. What if I miss a dose? It is important not to miss a dose. Call your doctor or health careprofessional if you are unable to keep an appointment. What may interact with this medication? This medicine may interact with the following medications: certain types of chemotherapy, such as daunorubicin, doxorubicin, epirubicin, and idarubicin This list may not describe all possible interactions. Give your health care provider a list of all the medicines, herbs, non-prescription drugs, or dietary supplements you use. Also tell them if you smoke, drink alcohol, or use illegaldrugs. Some items may interact with your medicine. What should I watch for while using this medication? Visit your doctor for checks on your progress. Report any side effects. Continue your course of treatment even though you feel ill unless your doctortells you to stop. Call your doctor or health care professional for advice if you get a fever, chills or sore throat, or other symptoms of a cold or flu. Do not treatyourself. Try to avoid being around people who are sick. You may experience  fever, chills and shaking during your first infusion. These effects are usually mild and can be treated with other medicines. Report any side effects during the infusion to your health care professional. Fever andchills usually do not happen with later infusions. Do not become pregnant while taking this medicine or for 7 months after stopping it. Women should inform their doctor if they wish to become pregnant or think they might be pregnant. Women of child-bearing potential will need to have a negative pregnancy test before starting this medicine. There is a potential for serious side effects to an unborn child. Talk to your health care professional or pharmacist for more information. Do not breast-feed an infantwhile taking this medicine or for 7 months after stopping it. Women must use effective birth control with this medicine. What side effects may I notice from receiving this medication? Side effects that you should report to your doctor or health care professionalas soon as possible: allergic reactions like skin rash, itching or hives, swelling of the face, lips, or tongue chest pain or palpitations cough dizziness feeling faint or lightheaded, falls fever general ill feeling or flu-like symptoms signs of worsening heart failure like breathing problems; swelling in your legs and feet  unusually weak or tired Side effects that usually do not require medical attention (report to yourdoctor or health care professional if they continue or are bothersome): bone pain changes in taste diarrhea joint pain nausea/vomiting weight loss This list may not describe all possible side effects. Call your doctor for medical advice about side effects. You may report side effects to FDA at1-800-FDA-1088. Where should I keep my medication? This drug is given in a hospital or clinic and will not be stored at home. NOTE: This sheet is a summary. It may not cover all possible information. If you have questions  about this medicine, talk to your doctor, pharmacist, orhealth care provider.  2022 Elsevier/Gold Standard (2016-04-29 14:37:52)

## 2021-01-16 ENCOUNTER — Encounter: Payer: Self-pay | Admitting: Oncology

## 2021-01-16 ENCOUNTER — Ambulatory Visit (INDEPENDENT_AMBULATORY_CARE_PROVIDER_SITE_OTHER): Payer: Medicare Other | Admitting: Surgical

## 2021-01-16 ENCOUNTER — Other Ambulatory Visit: Payer: Self-pay

## 2021-01-16 DIAGNOSIS — C50912 Malignant neoplasm of unspecified site of left female breast: Secondary | ICD-10-CM

## 2021-01-16 DIAGNOSIS — Z170 Estrogen receptor positive status [ER+]: Secondary | ICD-10-CM

## 2021-01-16 NOTE — Progress Notes (Signed)
Patient is a 70 year old female here for follow-up after bilateral nipple sparing mastectomy with immediate breast reconstruction and placement of tissue expanders with Dr. Claudia Desanctis on 12/11/2020. She has a history of a left latissimus flap and right-sided reduction/mastopexy at the same time of her previous left breast reconstruction in 2004.  She has a history of radiation to the left breast.  We aspirated some serosanguineous fluid from her left and right breast at her last appointment 2 weeks ago.  Today she reports that she is doing well.  She has not noticed any increased swelling.  She is not having any infectious symptoms.    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  Based on EMR review she is undergoing trastuzumab and pertuzumab.  She will undergo 10 more doses.  Chaperone present on exam On exam bilateral breast incisions intact, no wounds are noted.  No subcutaneous fluid collections noted with palpation.  No erythema or cellulitic changes bilateral NAC's are viable.  Left breast expander is slightly higher in position, but overall she is fairly symmetric.  We placed injectable saline in the Expander using a sterile technique: Right: 50 cc for a total of 200 / 300 cc Left: 50 cc for a total of 200 / 300 cc  Recommend following up in 3 weeks for reevaluation and additional fill. Discussed with patient today that surgical intervention may be delayed due to HER2 targeted therapy, will discuss with Dr. Claudia Desanctis.  She also has a history of radiation to the left breast so is at an increased risk of postoperative complications related to this. Pictures were obtained of the patient and placed in the chart with the patient's or guardian's permission.

## 2021-01-23 ENCOUNTER — Telehealth: Payer: Self-pay | Admitting: Oncology

## 2021-01-23 NOTE — Telephone Encounter (Signed)
Patient going out-of-town 9/16, 9/19 - Rescheduled to 9/20 Labs, Follow Up - 9/23 Infusion 1:15 pm

## 2021-01-24 ENCOUNTER — Other Ambulatory Visit: Payer: Self-pay | Admitting: Pharmacist

## 2021-01-29 NOTE — Progress Notes (Signed)
[facility]  [address] [place],  [place]  [address] [phone]  Clinic Day:  02/05/2021  Referring physician: Delphina Cahill, FNP  This document serves as a record of services personally performed by Tammy Poisson, MD. It was created on their behalf by Tammy Fowler, a trained medical scribe. The creation of this record is based on the scribe's personal observations and the provider's statements to them.  CHIEF COMPLAINT:  CC:  History of hormone and HER2/neu receptor positive breast cancer   Current Treatment:   Adjuvant HER2 targeted therapy with trastuzumab/pertuzumab  HISTORY OF PRESENT ILLNESS:  Tammy Fowler is a 69 y.o. 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 Excisional biopsy revealed a 2 cm, grade 2, infiltrating ductal carcinoma in the left lower quadrant.  Estrogen and progesterone receptors were positive and her 2 Neu negative.  Tumor was present at the margins, so she underwent re-excision and axillary dissection.  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  Four of sixteen lymph nodes were positive for metastasis.  All surgical margins were free of tumor involvement.  She received adjuvant chemotherapy with with dose dense Adriamycin and Cytoxan for 4 cycles, followed by dose dense Taxol for 4 cycles, follwed by adjuvant radiation of the left breast.  She was placed on anastrozole 1 mg daily in April 2005 and completed 10 years of therapy in April 2015.  She underwent left latissimus dorsi muscle reconstruction of the left breast and right breast reduction.  She underwent total abdominal hysterectomy and bilateral salpingo-oophorectomy prior to starting anastrozole.  She had excision of a benign mass from the left breast in 2009.  She underwent testing for BRCA mutations, including BART, in April 2013,  which was negative, however, we did not have the results.  She has a history of osteoporosis, previously treated with zolendronic acid every 6 months, which was discontinued when she completed anastrozole.     We began seeing her in July 2014, when she relocated to this area.  Bone density scan in December 2015 was normal.  Her personal and family history wass suggestive of a hereditary cancer syndrome, so we recommended additional genetic testing with the [employer] myRisk Update Panel test to look for a mutation in other genes known to increase the risk for breast cancer and as well as other cancers.  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  These results were reviewed with her in detail and she was scheduled for an breast MRI which did not reveal any evidence of malignancy. Bone density in January 2019 was normal.  Breast MRI in August 2019 did not reveal any evidence of malignancy  She developed diabetes and had an MRI abdomen since pancreatic cancer is increased with the ATM mutation, which was negative.  She  had colonoscopy in early 2020 by Dr. Marcell Fowler in [place] and had 2 polyps removed. Repeat colonoscopy in 5 years was recommended. Bilateral screening mammogram in March 2020 and April 2021 did not reveal any evidence of malignancy.   She was seen in January 2022 with a new palpable mass in the left breast.  Diagnostic left mammogram revealed a suspicious mass measuring 2.0 cm at  10 o'clock. Diagnostic right mammogram did not reveal any evidence of malignancy.  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  Estrogen receptor was 30% positive and progesterone receptor negative and HER2 receptor positive.  Ki67 was 25%.   However, there was no breast tissue in the sample, so this was felt to be more likely a recurrence rather than a new breast primary, even though it has been many years. But her previous cancer was HER2 negative and ER positive.    She started treatment with [facility] on February 15th.  She experienced decreased appetite, taste changes, mouth sores, and diarrhea despite imodium, and was treated with supportive care. She did have transient weight loss and hypomagnesemia. MRI brain did not reveal any intracranial metastasis.  She was given IV fluids and placed on lorazepam 0.5 mg every 6 hours as needed for nausea, vomiting and anxiety.  She was also placed on dronabinol 5 mg at bedtime for nausea, vomiting and appetite.  At her visit on May 6th, her side effects were fairly well controlled and she had gained 5 lb  She proceeded with a 5th cycle of TCHP at 25% dose reduction on May 11th.  She completed 6 cycles by July. She underwent bilateral mastectomy and reconstruction on July 26th with Dr. Donne Fowler and Dr. Claudia Fowler.  Final pathology revealed residual carcinoma of the left breast status post neoadjuvant therapy, measuring 1.5 cm.  Margins uninvolved by carcinoma.  In the right breast, no malignancy was identified. After her last dose of Herceptin/Perjeta she had severe bilateral knee pain which persisted and she was given a prescription for Mobic.  INTERVAL HISTORY:  Tammy Fowler is here for routine follow up prior to a potential 3rd cycle of trastuzumab/pertuzumab. I wrote down this regimen so she may explain this to her plastic surgeon that this does not compromise her white cells or platelets. Her new primary care provider, Tammy Fowler, is working with her pain management. She received cortisone injections of her knees and notes improvement in her pain, which she now rates as a 4/10 today, left worse than right. She only notes soreness, but feels this is due to her increased activity. She continues Mobic 15 mg daily.  Hemoglobin has mildly decreased from 11.9 to 11.5, and white count and platelets are normal. Chemistries are unremarkable except for a Fowler of 21. Nonfasting blood glucose is 231. Her  appetite is good, and she has gained 11 1/2 pounds since her last visit.  She denies fever, chills or signs of infection.  She denies nausea, vomiting, bowel issues, or abdominal pain.  She denies sore throat, cough, dyspnea, or chest pain.  REVIEW OF SYSTEMS:  Review of Systems  Constitutional: Negative.  Negative for appetite change, chills, fatigue, fever and unexpected weight change.  HENT:  Negative.    Eyes: Negative.   Respiratory: Negative.  Negative for chest tightness, cough, hemoptysis, shortness of breath and wheezing.   Cardiovascular: Negative.  Negative for chest pain, leg swelling and palpitations.  Gastrointestinal: Negative.  Negative for abdominal distention, abdominal pain, blood in stool, constipation, diarrhea, nausea and vomiting.  Endocrine: Negative.   Genitourinary: Negative.  Negative for difficulty urinating, dysuria, frequency and hematuria.   Musculoskeletal:  Positive for arthralgias (of the knees, improved). Negative for back pain, flank pain, gait problem and myalgias.  Skin: Negative.   Neurological: Negative.  Negative for dizziness, extremity weakness, gait problem, headaches, light-headedness, numbness,  seizures and speech difficulty.  Hematological: Negative.   Psychiatric/Behavioral: Negative.  Negative for depression and sleep disturbance. The patient is not nervous/anxious.     VITALS:  Blood pressure (!) 120/57, pulse 97, temperature 98.3 F (36.8 C), temperature source Oral, resp. rate 18, height 5' 2"  (1.575 m), weight 138 lb 12.8 oz (63 kg), SpO2 99 %.    Wt Readings from Last 3 Encounters:  02/05/21 138 lb 12.8 oz (63 kg)  01/14/21 135 lb (61.2 kg)  01/10/21 127 lb 1.9 oz (57.7 kg)    Body mass index is 25.39 kg/m.  Performance status (ECOG): 1 - Symptomatic  but completely ambulatory  PHYSICAL EXAM:  Physical Exam Constitutional:      General: She is not in acute distress.    Appearance: Normal appearance. She is normal weight.  HENT:     Head: Normocephalic and atraumatic.  Eyes:     General: No scleral icterus.    Extraocular Movements: Extraocular movements intact.     Conjunctiva/sclera: Conjunctivae normal.     Pupils: Pupils are equal, round, and reactive to light.  Cardiovascular:     Rate and Rhythm: Normal rate and regular rhythm.     Pulses: Normal pulses.     Heart sounds: Normal heart sounds. No murmur heard.   No friction rub. No gallop.  Pulmonary:     Effort: Pulmonary effort is normal. No respiratory distress.     Breath sounds: Normal breath sounds.  Chest:     Comments: Bilateral reconstructions are negative with partially filled expanders Abdominal:     General: Bowel sounds are normal. There is no distension.     Palpations: Abdomen is soft. There is no hepatomegaly, splenomegaly or mass.     Tenderness: There is no abdominal tenderness.  Musculoskeletal:        General: Normal range of motion.     Cervical back: Normal range of motion and neck supple.     Right lower leg: No edema.     Left lower leg: No edema.  Lymphadenopathy:     Cervical: No cervical adenopathy.  Skin:    General: Skin is warm and dry.  Neurological:     General: No focal deficit present.     Mental Status: She is alert and oriented to person, place, and time. Mental status is at baseline.  Psychiatric:        Mood and Affect: Mood normal.        Behavior: Behavior normal.        Thought Content: Thought content normal.        Judgment: Judgment normal.   LABS:   CBC Latest Ref Rng & Units 02/05/2021 01/10/2021 11/27/2020  WBC - 4.4 5.3 4.5  Hemoglobin 12.0 - 16.0 11.5(A) 11.9(A) 10.8(A)  Hematocrit 36 - 46 35(A) 36 33(A)  Platelets 150 - 399 230 236 276   CMP Latest Ref Rng & Units 02/05/2021 01/10/2021 11/27/2020  Glucose 70 -  99 mg/dL - - -  Fowler 4 - 21 21 22(A) 19  Creatinine 0.5 - 1.1 0.7 0.6 0.5  Sodium 137 - 147 139 138 138  Potassium 3.4 - 5.3 4.7 4.8 4.7  Chloride 99 - 108 103 102 101  CO2 13 - 22 27(A) 25(A) 30(A)  Calcium 8.7 - 10.7 9.6 10.2 9.5  Alkaline Phos 25 - 125 85 88 84  AST 13 - 35 31 28 28   ALT 7 - 35 25 16 21     STUDIES:  No results found.    HISTORY:   Allergies:  Allergies  Allergen Reactions   Oxycodone Itching   Codeine Other (See Comments)    Other reaction(s): Other (See Comments) Unknown Unknown    Lisinopril     Other reaction(s): Cough (ALLERGY/intolerance)    Current Medications: Current Outpatient Medications  Medication Sig Dispense Refill   ACCU-CHEK GUIDE test strip daily. for testing as directed     aspirin 81 MG chewable tablet Chew by mouth.     benzonatate (TESSALON) 100 MG capsule      calcium-vitamin D 250-100 MG-UNIT tablet Take by mouth.     dexamethasone (DECADRON) 4 MG tablet Take 2 tablets (8 mg total) by mouth 2 (two) times daily. Start the day before Taxotere. Then take daily x 3 days after chemotherapy. (Patient taking differently: Take 4 mg by mouth 2 (two) times daily. Start the day before Taxotere. Then take daily x 3 days after chemotherapy.) 30 tablet 1   diphenoxylate-atropine (LOMOTIL) 2.5-0.025 MG tablet Take 2 tablets by mouth 4 (four) times daily as needed for diarrhea or loose stools. 60 tablet 3   Docusate Sodium (DSS) 100 MG CAPS Take by mouth.     dronabinol (MARINOL) 5 MG capsule Take 1 capsule (5 mg total) by mouth at bedtime. 30 capsule 2   DULoxetine (CYMBALTA) 60 MG capsule Take 1 capsule by mouth daily.     FLUoxetine (PROZAC) 40 MG capsule Take by mouth.     fluticasone (FLONASE) 50 MCG/ACT nasal spray      gabapentin (NEURONTIN) 300 MG capsule TAKE 1 CAPSULE BY MOUTH THREE TIMES A DAY 90 capsule 0   HYDROcodone-acetaminophen (NORCO) 7.5-325 MG tablet TAKE 1 TABLET BY MOUTH EVERY 4 HOURS AS NEEDED FOR MODERATE PAIN 120 tablet  0   HYDROmorphone (DILAUDID) 2 MG tablet Take 0.5 tablets (1 mg total) by mouth every 4 (four) hours as needed for severe pain. 25 tablet 0   lidocaine-prilocaine (EMLA) cream Apply to affected area once 30 g 3   LORazepam (ATIVAN) 0.5 MG tablet Take 1 tablet (0.5 mg total) by mouth every 6 (six) hours as needed for anxiety. 90 tablet 0   losartan (COZAAR) 50 MG tablet daily.     magic mouthwash (nystatin, lidocaine, diphenhydrAMINE) suspension Take 5 mLs by mouth every 3 (three) hours as needed for mouth pain. Swish [employer] Spit; 259m bottle [facility] 1305 per Melissa P,NP     magnesium oxide (MAG-OX) 400 (241.3 Mg) MG tablet Take 1 tablet (400 mg total) by mouth 2 (two) times daily. 60 tablet 1   meclizine (ANTIVERT) 25 MG tablet Take 1 tablet (25 mg total) by mouth 3 (three) times daily as needed for dizziness. 30 tablet 0   meloxicam (MOBIC) 15 MG tablet Take 1 tablet (15 mg total) by mouth daily. 30 tablet 5   metFORMIN (GLUCOPHAGE) 500 MG tablet 2 (two) times daily.     methocarbamol (ROBAXIN) 500 MG tablet Take 1 tablet (500 mg total) by mouth every 8 (eight) hours as needed for muscle spasms. 30 tablet 0   Multiple Vitamins-Minerals (THERA-M) TABS Take by mouth.     ondansetron (ZOFRAN) 4 MG tablet Take 1-2 tablets (4-8 mg total) by mouth every 8 (eight) hours as needed for nausea or vomiting. 20 tablet 0   ondansetron (ZOFRAN) 8 MG tablet Take 1 tablet (8 mg total) by mouth 2 (two) times daily as needed (Nausea or vomiting). Start on the  third day after chemotherapy. 30 tablet 1   prochlorperazine (COMPAZINE) 10 MG tablet Take 1 tablet (10 mg total) by mouth every 6 (six) hours as needed (Nausea or vomiting). 30 tablet 1   rosuvastatin (CRESTOR) 40 MG tablet daily.     traMADol (ULTRAM) 50 MG tablet Take 1-2 tablets (50-100 mg total) by mouth every 6 (six) hours as needed. 60 tablet 0   vitamin B-12 (CYANOCOBALAMIN) 500 MCG tablet Take 500 mcg by mouth daily.      No current facility-administered medications for this visit.   Facility-Administered Medications Ordered in Other Visits  Medication Dose Route Frequency Provider Last Rate Last Admin   acetaminophen (TYLENOL) tablet 650 mg  650 mg Oral Once [name], MD       diphenhydrAMINE (BENADRYL) capsule 25 mg  25 mg Oral Once [name], MD       sodium chloride flush (NS) 0.9 % injection 10 mL  10 mL Intracatheter PRN [name], MD   10 mL at 01/14/21 1624     ASSESSMENT & PLAN:   Assessment:  1.  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 This is felt to most likely represent a recurrence rather than a 2nd breast primary.  However, this is now HER 2 positive in addition to ER positive.  She completed 6 cycles of neoadjuvant TCHP in July.  After 4 cycles, the doses were reduced by 25%, but she still was experiencing adverse effects. She completed chemotherapy. She received one cycle of HER2 targeted therapy prior to reconstructive surgery. She is due for 11 postop doses and will proceed.    2.  Significant arthralgias, especially of the knees.  This is better controlled with cortisone injections and Mobic 15 mg daily.    Plan:     She will proceed with a 3rd cycle of trastuzumab/pertuzumab on Friday. Her pain is better controlled and she will continue to follow with pain management. I wrote down her current therapy regimen so that she may explain this to her plastic surgeon, and the fact that her blood counts remain normal. We will see her back in 3 weeks with CBC and CMP prior to a 4th cycle.  We will plan to repeat an ECHO this month, September.  She verbalizes understanding of and agreement to the plans discussed today. She knows to call the office should any new  questions or concerns arise.    I, Rita Ohara, am acting as scribe for [name], MD  I have reviewed this report as typed by the medical scribe, and it is complete and accurate.  Tammy Poisson MD [place] [facility]

## 2021-02-01 ENCOUNTER — Ambulatory Visit: Payer: Medicare Other | Admitting: Oncology

## 2021-02-01 ENCOUNTER — Other Ambulatory Visit: Payer: Medicare Other

## 2021-02-04 ENCOUNTER — Ambulatory Visit: Payer: Medicare Other

## 2021-02-05 ENCOUNTER — Encounter: Payer: Self-pay | Admitting: Oncology

## 2021-02-05 ENCOUNTER — Inpatient Hospital Stay: Payer: Medicare Other | Attending: Oncology

## 2021-02-05 ENCOUNTER — Inpatient Hospital Stay (INDEPENDENT_AMBULATORY_CARE_PROVIDER_SITE_OTHER): Payer: Medicare Other | Admitting: Oncology

## 2021-02-05 ENCOUNTER — Other Ambulatory Visit: Payer: Self-pay | Admitting: Oncology

## 2021-02-05 ENCOUNTER — Other Ambulatory Visit: Payer: Self-pay

## 2021-02-05 ENCOUNTER — Telehealth: Payer: Self-pay | Admitting: Hematology and Oncology

## 2021-02-05 VITALS — BP 120/57 | HR 97 | Temp 98.3°F | Resp 18 | Ht 62.0 in | Wt 138.8 lb

## 2021-02-05 DIAGNOSIS — M810 Age-related osteoporosis without current pathological fracture: Secondary | ICD-10-CM

## 2021-02-05 DIAGNOSIS — C50911 Malignant neoplasm of unspecified site of right female breast: Secondary | ICD-10-CM

## 2021-02-05 DIAGNOSIS — C50919 Malignant neoplasm of unspecified site of unspecified female breast: Secondary | ICD-10-CM

## 2021-02-05 DIAGNOSIS — Z5112 Encounter for antineoplastic immunotherapy: Secondary | ICD-10-CM | POA: Insufficient documentation

## 2021-02-05 DIAGNOSIS — Z23 Encounter for immunization: Secondary | ICD-10-CM | POA: Insufficient documentation

## 2021-02-05 DIAGNOSIS — C50912 Malignant neoplasm of unspecified site of left female breast: Secondary | ICD-10-CM

## 2021-02-05 DIAGNOSIS — Z9013 Acquired absence of bilateral breasts and nipples: Secondary | ICD-10-CM | POA: Insufficient documentation

## 2021-02-05 DIAGNOSIS — C50212 Malignant neoplasm of upper-inner quadrant of left female breast: Secondary | ICD-10-CM | POA: Insufficient documentation

## 2021-02-05 DIAGNOSIS — Z79811 Long term (current) use of aromatase inhibitors: Secondary | ICD-10-CM | POA: Insufficient documentation

## 2021-02-05 DIAGNOSIS — Z79899 Other long term (current) drug therapy: Secondary | ICD-10-CM | POA: Insufficient documentation

## 2021-02-05 DIAGNOSIS — Z7952 Long term (current) use of systemic steroids: Secondary | ICD-10-CM | POA: Insufficient documentation

## 2021-02-05 DIAGNOSIS — Z170 Estrogen receptor positive status [ER+]: Secondary | ICD-10-CM | POA: Insufficient documentation

## 2021-02-05 DIAGNOSIS — Z7984 Long term (current) use of oral hypoglycemic drugs: Secondary | ICD-10-CM | POA: Insufficient documentation

## 2021-02-05 LAB — BASIC METABOLIC PANEL
BUN: 21 (ref 4–21)
CO2: 27 — AB (ref 13–22)
Chloride: 103 (ref 99–108)
Creatinine: 0.7 (ref 0.5–1.1)
Glucose: 231
Potassium: 4.7 (ref 3.4–5.3)
Sodium: 139 (ref 137–147)

## 2021-02-05 LAB — CBC: RBC: 3.53 — AB (ref 3.87–5.11)

## 2021-02-05 LAB — HEPATIC FUNCTION PANEL
ALT: 25 (ref 7–35)
AST: 31 (ref 13–35)
Alkaline Phosphatase: 85 (ref 25–125)
Bilirubin, Total: 0.3

## 2021-02-05 LAB — COMPREHENSIVE METABOLIC PANEL
Albumin: 4.4 (ref 3.5–5.0)
Calcium: 9.6 (ref 8.7–10.7)

## 2021-02-05 LAB — CBC AND DIFFERENTIAL
HCT: 35 — AB (ref 36–46)
Hemoglobin: 11.5 — AB (ref 12.0–16.0)
Neutrophils Absolute: 2.82
Platelets: 230 (ref 150–399)
WBC: 4.4

## 2021-02-05 NOTE — Telephone Encounter (Signed)
Per 9/20 LOS, patient scheduled for Oct Appt's.  Gave patient Appt Summary

## 2021-02-06 ENCOUNTER — Ambulatory Visit (INDEPENDENT_AMBULATORY_CARE_PROVIDER_SITE_OTHER): Payer: Medicare Other | Admitting: Surgical

## 2021-02-06 ENCOUNTER — Ambulatory Visit: Payer: Medicare Other | Attending: General Surgery | Admitting: Physical Therapy

## 2021-02-06 ENCOUNTER — Encounter: Payer: Self-pay | Admitting: Physical Therapy

## 2021-02-06 ENCOUNTER — Encounter: Payer: Self-pay | Admitting: Surgical

## 2021-02-06 DIAGNOSIS — M62838 Other muscle spasm: Secondary | ICD-10-CM | POA: Diagnosis present

## 2021-02-06 DIAGNOSIS — Z483 Aftercare following surgery for neoplasm: Secondary | ICD-10-CM | POA: Diagnosis present

## 2021-02-06 DIAGNOSIS — R293 Abnormal posture: Secondary | ICD-10-CM | POA: Insufficient documentation

## 2021-02-06 DIAGNOSIS — M25611 Stiffness of right shoulder, not elsewhere classified: Secondary | ICD-10-CM | POA: Diagnosis present

## 2021-02-06 DIAGNOSIS — R262 Difficulty in walking, not elsewhere classified: Secondary | ICD-10-CM

## 2021-02-06 DIAGNOSIS — M6281 Muscle weakness (generalized): Secondary | ICD-10-CM

## 2021-02-06 DIAGNOSIS — C50912 Malignant neoplasm of unspecified site of left female breast: Secondary | ICD-10-CM

## 2021-02-06 DIAGNOSIS — M25612 Stiffness of left shoulder, not elsewhere classified: Secondary | ICD-10-CM | POA: Insufficient documentation

## 2021-02-06 DIAGNOSIS — Z170 Estrogen receptor positive status [ER+]: Secondary | ICD-10-CM

## 2021-02-06 NOTE — Therapy (Signed)
[place], [place], [address] Phone: [phone]   Fax:  [phone]  Physical Therapy Treatment  Patient Details  Name: Tammy Fowler MRN: [id number] Date of Birth: 1950/05/24 Referring Provider (PT): Dr. Donne Hazel   Encounter Date: 02/06/2021   PT End of Session - 02/06/21 1157     Visit Number 2    Number of Visits 10    Date for PT Re-Evaluation 03/06/21    PT Start Time 0806    PT Stop Time 0850    PT Time Calculation (min) 44 min    Activity Tolerance Patient tolerated treatment well    Behavior During Therapy [facility] for tasks assessed/performed             Past Medical History:  Diagnosis Date   Cancer ([place])    Depression    Diabetes mellitus without complication ([place])    Hyperlipidemia    Hypomagnesemia 08/10/2020   Sleep apnea     Past Surgical History:  Procedure Laterality Date   ABDOMINAL HYSTERECTOMY     BREAST RECONSTRUCTION WITH PLACEMENT OF TISSUE EXPANDER AND FLEX HD (ACELLULAR HYDRATED DERMIS) Bilateral 12/11/2020   Procedure: BILATERAL BREAST RECONSTRUCTION WITH PLACEMENT OF TISSUE EXPANDER AND FLEX HD (ACELLULAR HYDRATED DERMIS);  Surgeon: [name], MD;  Location: [place];  Service: Plastics;  Laterality: Bilateral;   BREAST SURGERY Left    HYSTERECTOMY ABDOMINAL WITH SALPINGO-OOPHORECTOMY  07/2003   due to fibroids   NIPPLE SPARING MASTECTOMY Bilateral 12/11/2020   Procedure: BILATERAL NIPPLE SPARING MASTECTOMY;  Surgeon: [name], MD;  Location: [place];  Service: General;  Laterality: Bilateral;   PORTACATH PLACEMENT Right 06/26/2020   Procedure: INSERTION PORT-A-CATH;  Surgeon: [name], MD;  Location: [place];  Service: General;  Laterality: Right;   TRIGGER FINGER RELEASE      There were no vitals filed for this visit.   Subjective Assessment - 02/06/21 0808     Subjective I  have been cleared to do everything now. I was on a trip which is why I have not been here since my eval. My shoulders are doing very good. I still have knee pain but not like I did. They gave me cortisone injections in my knees and it helped.    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 Yes    Pain Score 5     Pain Location Knee    Pain Orientation Left   left worse than right   Pain Descriptors / Indicators Sore    Pain Type Acute pain    Pain Onset More than a month ago    Pain Frequency Constant    Aggravating Factors  walking    Pain Relieving Factors rest    Effect of Pain on Daily Activities limit ability to walk but wears braces on knees in order to complete ADLs                [facility] PT Assessment - 02/06/21 0001  Observation/Other Assessments   Observations pt with tenderness to touch in area of bilateral serratus anterior      Sit to Stand   Comments 30 sec sit to stand: 13 reps which is average for her age      AROM   Right Shoulder Flexion 175 Degrees    Right Shoulder ABduction 173 Degrees    Left Shoulder Flexion 176 Degrees    Left Shoulder ABduction 175 Degrees      Strength   Right Hip Flexion 3/5    Right Hip Extension 4-/5    Right Hip ABduction 5/5    Left Hip Flexion 3/5    Left Hip Extension 4-/5    Left Hip ABduction 5/5    Right Knee Flexion 5/5    Right Knee Extension 5/5    Left Knee Flexion 5/5    Left Knee Extension 5/5    Right Ankle Dorsiflexion 3+/5    Left Ankle Dorsiflexion 4+/5      Berg Balance Test   Sit to Stand  Able to stand without using hands and stabilize independently    Standing Unsupported Able to stand safely 2 minutes    Sitting with Back Unsupported but Feet Supported on Floor or Stool Able to sit safely and securely 2 minutes    Stand to Sit Sits safely with minimal use of hands    Transfers Able to transfer safely, minor use of hands    Standing Unsupported with Eyes Closed Able to stand 10 seconds safely    Standing Unsupported with Feet Together Able to place feet together independently and stand 1 minute safely    From Standing, Reach Forward with Outstretched Arm Can reach confidently >25 cm (10")    From Standing Position, Pick up Object from Floor Able to pick up shoe safely and easily    From Standing Position, Turn to Look Behind Over each Shoulder Looks behind from both sides and weight shifts well    Turn 360 Degrees Able to turn 360 degrees safely in 4 seconds or less    Standing Unsupported, Alternately Place Feet on Step/Stool Able to stand independently and safely and complete 8 steps in 20 seconds    Standing Unsupported, One Foot in Front Able to place foot tandem independently and hold 30 seconds    Standing on One Leg Able to lift leg independently and hold > 10 seconds    Total Score 56      High Level Balance   High Level Balance Comments pt able to stand on either leg for about 30 sec, pt with difficulty walking in tandem stance - unable to maintain balance, pt also has increased difficulty standing with eyes closed on a soft surface but was able to maintain balance with this                                    PT Education - 02/06/21 0906     Education Details educated pt that if she does get a massage to either avoid the upper body or ensure the therapist uses light pressure to avoid lymphedema risk    Person(s) Educated Patient    Methods Explanation    Comprehension Verbalized understanding                 PT Long Term Goals -  02/06/21 6226       PT LONG TERM GOAL #  1   Title Pt will demonstrate 165 degrees of bilateral shoulder flexion to allow her to reach overhead.    Baseline R 144, L 142; 02/06/21- R 175, L 173    Time 4    Period Weeks    Status Achieved      PT LONG TERM GOAL #2   Title Pt will demonstrate 165 degrees of bilateral shoulder abduction to allow her to reach out to the side.    Baseline R 126, L 110; 02/06/21- R 176, L 175    Time 4    Period Weeks    Status Achieved      PT LONG TERM GOAL #3   Title Pt will be independent in a home exercise program for continued strengthening and stretching.    Period Weeks    Status On-going      PT LONG TERM GOAL #4   Title Pt will demonstrate 5/5 bilateral ankle dorsiflexion strength to decrease risk of falls    Baseline R 3+/5, L 4+/5    Time 4    Period Weeks    Status New    Target Date 03/06/21      PT LONG TERM GOAL #5   Title Pt will demonstrate 5/5 bilateral hip flexion strength to decrease risk of falls    Baseline R and L 3/5    Time 4    Period Weeks    Status New      Additional Long Term Goals   Additional Long Term Goals Yes      PT LONG TERM GOAL #6   Title Pt will report decreased discomfort in bilateral trunk to palpation to allow improved comfort.    Baseline pt has discomfort and soreness in bilateral trunk in area of serratus    Time 4    Period Weeks    Status New    Target Date 03/06/21                   Plan - 02/06/21 0901     Clinical Impression Statement Pt returns to PT after not being seen since her eval. Pt reports she had to receive clearance from her surgeon and then was gone on a trip. Her ROM has improved greatly since her eval and she has met her bilateral shoulder ROM goals. She is still having knee pain bilaterally. She reports this has improved some after receiving cortisone injections bilaterally but is still present. She has an increased fear of falling. Pt likes to hunt and is often on  uneven terrain which she has increased difficulty with. Her BERG balance score was perfect and she was able to do the average number of sit to stands for her age in 30 seconds. She does have increased difficulty with tandem stance, standing with eyes clsoed and standing on uneven surfaces. She has increased bilateral hip flexor weakness of 3/5 and her R ankle dorsiflexion strength is 3+/5. She would benefit from skilled PT services to improve bilateral ankle dorsiflexor strength, improve bilateral hip flexion strength and improve balance through high level balance activities to decrease her fall risk and allow her to return to prior level of function.    PT Frequency 2x / week    PT Duration 4 weeks    PT Treatment/Interventions ADLs/Self Care Home Management;Therapeutic activities;Therapeutic exercise;Balance training;Neuromuscular re-education;Patient/family education;Manual techniques;Manual lymph drainage;Scar mobilization;Passive range of motion;Taping    PT Next Visit Plan begin ankle and hip flexor strengthening, high level balance activities  with eyes closed/open/uneven surfaces/ tandem walking etc, STM to bilateral serratus    PT Home Exercise Plan sit to stands, standing on 1 leg    Consulted and Agree with Plan of Care Patient             Patient will benefit from skilled therapeutic intervention in order to improve the following deficits and impairments:  Postural dysfunction, Impaired UE functional use, Impaired flexibility, Increased fascial restricitons, Decreased strength, Decreased activity tolerance, Decreased range of motion, Decreased scar mobility, Increased edema, Decreased balance, Decreased endurance, Decreased mobility, Difficulty walking, Increased muscle spasms  Visit Diagnosis: Difficulty in walking, not elsewhere classified  Muscle weakness (generalized)  Other muscle spasm  Stiffness of left shoulder, not elsewhere classified  Stiffness of right shoulder, not  elsewhere classified  Aftercare following surgery for neoplasm  Abnormal posture     Problem List Patient Active Problem List   Diagnosis Date Noted   Nausea without vomiting 10/24/2020   Hypokalemia 08/23/2020   Dehydration 08/23/2020   Hypomagnesemia 08/10/2020   Local recurrence of cancer of left breast ([place]) 06/08/2020    Class: Diagnosis of   Osteoporosis 06/04/2020    Class: Chronic   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

## 2021-02-06 NOTE — Progress Notes (Signed)
Patient is a 70 year old female here for follow-up after bilateral nipple sparing mastectomy with immediate breast reconstruction placement of tissue expanders with Dr. Claudia Desanctis on 12/11/2020.  She is 2 months postop.  She has a history of a left latissimus flap and right-sided reduction/mastopexy at the time of her previous reconstruction in 2004.  She also is a history of radiation to the left breast.  Patient reports she discussed with her oncologist the possibility of exchanging for implants sooner and her oncologist discussed with her that the HER2 dual targeted therapy could be postponed for a period of time to allow for exchange of expanders to implants.  She stated to the patient that her labs are appearing normal.  She recently went on a trip to [place] to go hunting and had a wonderful time.   Chaperone present on exam On exam bilateral breast incisions are intact, healing well.  Bilateral NAC's are viable.  Bilateral breasts are symmetric in regards to volume, she does have some slight position differences, likely related to the history of radiation in the left breast.  The left expander is expanding slightly higher and causing a skin fold which is bothersome to the patient. There is no erythema or cellulitic changes noted.   We placed injectable saline in the Expander using a sterile technique: Right: 70 cc for a total of 270 / 300 cc Left: 60 cc for a total of 260 / 300 cc  Pictures were obtained of the patient and placed in the chart with the patient's or guardian's permission.  Recommend following up in 2 weeks with Dr. Claudia Desanctis to discuss surgical plan and possibility of holding her to dual therapy.

## 2021-02-07 ENCOUNTER — Encounter: Payer: Self-pay | Admitting: Oncology

## 2021-02-07 MED FILL — Trastuzumab-anns For IV Soln 150 MG: INTRAVENOUS | Qty: 18 | Status: AC

## 2021-02-07 MED FILL — Pertuzumab Soln for IV Infusion 420 MG/14ML (30 MG/ML): INTRAVENOUS | Qty: 14 | Status: AC

## 2021-02-07 NOTE — Progress Notes (Unsigned)
Per [employer] web-page: No PA required  for CPT 93306 (ECHO)      No PA required from [employer]

## 2021-02-08 ENCOUNTER — Inpatient Hospital Stay: Payer: Medicare Other

## 2021-02-08 ENCOUNTER — Other Ambulatory Visit: Payer: Self-pay

## 2021-02-08 DIAGNOSIS — C50212 Malignant neoplasm of upper-inner quadrant of left female breast: Secondary | ICD-10-CM | POA: Diagnosis present

## 2021-02-08 DIAGNOSIS — Z170 Estrogen receptor positive status [ER+]: Secondary | ICD-10-CM | POA: Diagnosis not present

## 2021-02-08 DIAGNOSIS — Z79811 Long term (current) use of aromatase inhibitors: Secondary | ICD-10-CM | POA: Diagnosis not present

## 2021-02-08 DIAGNOSIS — Z5112 Encounter for antineoplastic immunotherapy: Secondary | ICD-10-CM | POA: Diagnosis not present

## 2021-02-08 DIAGNOSIS — Z7952 Long term (current) use of systemic steroids: Secondary | ICD-10-CM | POA: Diagnosis not present

## 2021-02-08 DIAGNOSIS — Z9013 Acquired absence of bilateral breasts and nipples: Secondary | ICD-10-CM | POA: Diagnosis not present

## 2021-02-08 DIAGNOSIS — C50912 Malignant neoplasm of unspecified site of left female breast: Secondary | ICD-10-CM

## 2021-02-08 DIAGNOSIS — Z23 Encounter for immunization: Secondary | ICD-10-CM | POA: Diagnosis not present

## 2021-02-08 DIAGNOSIS — Z7984 Long term (current) use of oral hypoglycemic drugs: Secondary | ICD-10-CM | POA: Diagnosis not present

## 2021-02-08 DIAGNOSIS — Z79899 Other long term (current) drug therapy: Secondary | ICD-10-CM | POA: Diagnosis not present

## 2021-02-08 MED ORDER — ACETAMINOPHEN 325 MG PO TABS
650.0000 mg | ORAL_TABLET | Freq: Once | ORAL | Status: AC
  Administered 2021-02-08: 650 mg via ORAL
  Filled 2021-02-08: qty 2

## 2021-02-08 MED ORDER — SODIUM CHLORIDE 0.9% FLUSH
10.0000 mL | INTRAVENOUS | Status: DC | PRN
  Administered 2021-02-08: 10 mL

## 2021-02-08 MED ORDER — TRASTUZUMAB-ANNS CHEMO 150 MG IV SOLR
6.0000 mg/kg | Freq: Once | INTRAVENOUS | Status: AC
  Administered 2021-02-08: 378 mg via INTRAVENOUS
  Filled 2021-02-08: qty 18

## 2021-02-08 MED ORDER — SODIUM CHLORIDE 0.9 % IV SOLN
420.0000 mg | Freq: Once | INTRAVENOUS | Status: AC
  Administered 2021-02-08: 420 mg via INTRAVENOUS
  Filled 2021-02-08: qty 14

## 2021-02-08 MED ORDER — SODIUM CHLORIDE 0.9 % IV SOLN: Freq: Once | INTRAVENOUS | Status: AC

## 2021-02-08 MED ORDER — DIPHENHYDRAMINE HCL 25 MG PO CAPS
25.0000 mg | ORAL_CAPSULE | Freq: Once | ORAL | Status: AC
  Administered 2021-02-08: 25 mg via ORAL
  Filled 2021-02-08: qty 1

## 2021-02-08 MED ORDER — HEPARIN SOD (PORK) LOCK FLUSH 100 UNIT/ML IV SOLN
500.0000 [IU] | Freq: Once | INTRAVENOUS | Status: AC | PRN
  Administered 2021-02-08: 500 [IU]

## 2021-02-08 MED ORDER — INFLUENZA VAC SPLIT QUAD 0.5 ML IM SUSY
0.5000 mL | PREFILLED_SYRINGE | Freq: Once | INTRAMUSCULAR | Status: AC
  Administered 2021-02-08: 0.5 mL via INTRAMUSCULAR
  Filled 2021-02-08: qty 0.5

## 2021-02-08 NOTE — Patient Instructions (Signed)
Pertuzumab injection What is this medication? PERTUZUMAB (per TOOZ ue mab) is a monoclonal antibody. It is used to treat breast cancer. This medicine may be used for other purposes; ask your health care provider or pharmacist if you have questions. COMMON BRAND NAME(S): PERJETA What should I tell my care team before I take this medication? They need to know if you have any of these conditions: heart disease heart failure high blood pressure history of irregular heart beat recent or ongoing radiation therapy an unusual or allergic reaction to pertuzumab, other medicines, foods, dyes, or preservatives pregnant or trying to get pregnant breast-feeding How should I use this medication? This medicine is for infusion into a vein. It is given by a health care professional in a hospital or clinic setting. Talk to your pediatrician regarding the use of this medicine in children. Special care may be needed. Overdosage: If you think you have taken too much of this medicine contact a poison control center or emergency room at once. NOTE: This medicine is only for you. Do not share this medicine with others. What if I miss a dose? It is important not to miss your dose. Call your doctor or health care professional if you are unable to keep an appointment. What may interact with this medication? Interactions are not expected. Give your health care provider a list of all the medicines, herbs, non-prescription drugs, or dietary supplements you use. Also tell them if you smoke, drink alcohol, or use illegal drugs. Some items may interact with your medicine. This list may not describe all possible interactions. Give your health care provider a list of all the medicines, herbs, non-prescription drugs, or dietary supplements you use. Also tell them if you smoke, drink alcohol, or use illegal drugs. Some items may interact with your medicine. What should I watch for while using this medication? Your condition  will be monitored carefully while you are receiving this medicine. Report any side effects. Continue your course of treatment even though you feel ill unless your doctor tells you to stop. Do not become pregnant while taking this medicine or for 7 months after stopping it. Women should inform their doctor if they wish to become pregnant or think they might be pregnant. Women of child-bearing potential will need to have a negative pregnancy test before starting this medicine. There is a potential for serious side effects to an unborn child. Talk to your health care professional or pharmacist for more information. Do not breast-feed an infant while taking this medicine or for 7 months after stopping it. Women must use effective birth control with this medicine. Call your doctor or health care professional for advice if you get a fever, chills or sore throat, or other symptoms of a cold or flu. Do not treat yourself. Try to avoid being around people who are sick. You may experience fever, chills, and headache during the infusion. Report any side effects during the infusion to your health care professional. What side effects may I notice from receiving this medication? Side effects that you should report to your doctor or health care professional as soon as possible: breathing problems chest pain or palpitations dizziness feeling faint or lightheaded fever or chills skin rash, itching or hives sore throat swelling of the face, lips, or tongue swelling of the legs or ankles unusually weak or tired Side effects that usually do not require medical attention (report to your doctor or health care professional if they continue or are bothersome): diarrhea hair loss   nausea, vomiting tiredness This list may not describe all possible side effects. Call your doctor for medical advice about side effects. You may report side effects to FDA at 1-800-FDA-1088. Where should I keep my medication? This drug is  given in a hospital or clinic and will not be stored at home. NOTE: This sheet is a summary. It may not cover all possible information. If you have questions about this medicine, talk to your doctor, pharmacist, or health care provider.  2022 Elsevier/Gold Standard (2015-06-07 12:08:50) Trastuzumab injection for infusion What is this medication? TRASTUZUMAB (tras TOO zoo mab) is a monoclonal antibody. It is used to treat breast cancer and stomach cancer. This medicine may be used for other purposes; ask your health care provider or pharmacist if you have questions. COMMON BRAND NAME(S): Herceptin, Janae Bridgeman, Ontruzant, Trazimera What should I tell my care team before I take this medication? They need to know if you have any of these conditions: heart disease heart failure lung or breathing disease, like asthma an unusual or allergic reaction to trastuzumab, benzyl alcohol, or other medications, foods, dyes, or preservatives pregnant or trying to get pregnant breast-feeding How should I use this medication? This drug is given as an infusion into a vein. It is administered in a hospital or clinic by a specially trained health care professional. Talk to your pediatrician regarding the use of this medicine in children. This medicine is not approved for use in children. Overdosage: If you think you have taken too much of this medicine contact a poison control center or emergency room at once. NOTE: This medicine is only for you. Do not share this medicine with others. What if I miss a dose? It is important not to miss a dose. Call your doctor or health care professional if you are unable to keep an appointment. What may interact with this medication? This medicine may interact with the following medications: certain types of chemotherapy, such as daunorubicin, doxorubicin, epirubicin, and idarubicin This list may not describe all possible interactions. Give your health care  provider a list of all the medicines, herbs, non-prescription drugs, or dietary supplements you use. Also tell them if you smoke, drink alcohol, or use illegal drugs. Some items may interact with your medicine. What should I watch for while using this medication? Visit your doctor for checks on your progress. Report any side effects. Continue your course of treatment even though you feel ill unless your doctor tells you to stop. Call your doctor or health care professional for advice if you get a fever, chills or sore throat, or other symptoms of a cold or flu. Do not treat yourself. Try to avoid being around people who are sick. You may experience fever, chills and shaking during your first infusion. These effects are usually mild and can be treated with other medicines. Report any side effects during the infusion to your health care professional. Fever and chills usually do not happen with later infusions. Do not become pregnant while taking this medicine or for 7 months after stopping it. Women should inform their doctor if they wish to become pregnant or think they might be pregnant. Women of child-bearing potential will need to have a negative pregnancy test before starting this medicine. There is a potential for serious side effects to an unborn child. Talk to your health care professional or pharmacist for more information. Do not breast-feed an infant while taking this medicine or for 7 months after stopping it. Women must use  effective birth control with this medicine. What side effects may I notice from receiving this medication? Side effects that you should report to your doctor or health care professional as soon as possible: allergic reactions like skin rash, itching or hives, swelling of the face, lips, or tongue chest pain or palpitations cough dizziness feeling faint or lightheaded, falls fever general ill feeling or flu-like symptoms signs of worsening heart failure like breathing  problems; swelling in your legs and feet unusually weak or tired Side effects that usually do not require medical attention (report to your doctor or health care professional if they continue or are bothersome): bone pain changes in taste diarrhea joint pain nausea/vomiting weight loss This list may not describe all possible side effects. Call your doctor for medical advice about side effects. You may report side effects to FDA at 1-800-FDA-1088. Where should I keep my medication? This drug is given in a hospital or clinic and will not be stored at home. NOTE: This sheet is a summary. It may not cover all possible information. If you have questions about this medicine, talk to your doctor, pharmacist, or health care provider.  2022 Elsevier/Gold Standard (2016-04-29 14:37:52)

## 2021-02-10 ENCOUNTER — Encounter: Payer: Self-pay | Admitting: Oncology

## 2021-02-10 ENCOUNTER — Telehealth: Payer: Self-pay | Admitting: Oncology

## 2021-02-18 DIAGNOSIS — I351 Nonrheumatic aortic (valve) insufficiency: Secondary | ICD-10-CM | POA: Diagnosis not present

## 2021-02-21 ENCOUNTER — Ambulatory Visit (INDEPENDENT_AMBULATORY_CARE_PROVIDER_SITE_OTHER): Payer: Medicare Other | Admitting: Plastic Surgery

## 2021-02-21 ENCOUNTER — Other Ambulatory Visit: Payer: Self-pay

## 2021-02-21 DIAGNOSIS — Z170 Estrogen receptor positive status [ER+]: Secondary | ICD-10-CM

## 2021-02-21 DIAGNOSIS — C50912 Malignant neoplasm of unspecified site of left female breast: Secondary | ICD-10-CM

## 2021-02-21 NOTE — Progress Notes (Signed)
Patient is here for continued expansion of her tissue expanders.  She currently has 270 cc in the right expander and 260 cc in the left expander.  She would like a little bit more.  50 cc were added to both expanders for recurrent total of 320 cc in the right expander and 310 cc in the left expander.  The prescribed fill volume of her expander is 300 cc.  She overall looks to be doing well on exam.  All of her incisions of healed great.  Her skin envelope is not quite as pliable on the left side due to her history of radiation but she seems to be tolerating this well.  She is interested in getting her expanders switched out for implants on the sooner side.  I am going to reach out to her oncologist to make sure that this can be done safely as she still getting what I understand is immune O therapy targeted treatment for her cancer in an adjuvant setting.  Overall she is done great and is in good spirits.  All of her questions were answered.

## 2021-02-26 ENCOUNTER — Other Ambulatory Visit: Payer: Self-pay | Admitting: Hematology and Oncology

## 2021-02-26 ENCOUNTER — Encounter: Payer: Self-pay | Admitting: Hematology and Oncology

## 2021-02-26 ENCOUNTER — Other Ambulatory Visit: Payer: Self-pay

## 2021-02-26 ENCOUNTER — Encounter: Payer: Self-pay | Admitting: Physical Therapy

## 2021-02-26 ENCOUNTER — Inpatient Hospital Stay: Payer: Medicare Other | Attending: Oncology

## 2021-02-26 ENCOUNTER — Other Ambulatory Visit: Payer: Self-pay | Admitting: Pharmacist

## 2021-02-26 ENCOUNTER — Ambulatory Visit: Payer: Medicare Other | Attending: General Surgery | Admitting: Physical Therapy

## 2021-02-26 ENCOUNTER — Inpatient Hospital Stay (INDEPENDENT_AMBULATORY_CARE_PROVIDER_SITE_OTHER): Payer: Medicare Other | Admitting: Hematology and Oncology

## 2021-02-26 DIAGNOSIS — M6281 Muscle weakness (generalized): Secondary | ICD-10-CM | POA: Diagnosis present

## 2021-02-26 DIAGNOSIS — M25612 Stiffness of left shoulder, not elsewhere classified: Secondary | ICD-10-CM | POA: Insufficient documentation

## 2021-02-26 DIAGNOSIS — R293 Abnormal posture: Secondary | ICD-10-CM | POA: Diagnosis present

## 2021-02-26 DIAGNOSIS — Z483 Aftercare following surgery for neoplasm: Secondary | ICD-10-CM | POA: Insufficient documentation

## 2021-02-26 DIAGNOSIS — E876 Hypokalemia: Secondary | ICD-10-CM | POA: Diagnosis not present

## 2021-02-26 DIAGNOSIS — C50919 Malignant neoplasm of unspecified site of unspecified female breast: Secondary | ICD-10-CM

## 2021-02-26 DIAGNOSIS — M25611 Stiffness of right shoulder, not elsewhere classified: Secondary | ICD-10-CM | POA: Diagnosis present

## 2021-02-26 DIAGNOSIS — R262 Difficulty in walking, not elsewhere classified: Secondary | ICD-10-CM | POA: Diagnosis not present

## 2021-02-26 DIAGNOSIS — E860 Dehydration: Secondary | ICD-10-CM

## 2021-02-26 DIAGNOSIS — M62838 Other muscle spasm: Secondary | ICD-10-CM | POA: Diagnosis present

## 2021-02-26 DIAGNOSIS — C50911 Malignant neoplasm of unspecified site of right female breast: Secondary | ICD-10-CM

## 2021-02-26 LAB — CBC AND DIFFERENTIAL
HCT: 37 (ref 36–46)
Hemoglobin: 12.4 (ref 12.0–16.0)
Neutrophils Absolute: 5.55
Platelets: 261 (ref 150–399)
WBC: 7.4

## 2021-02-26 LAB — BASIC METABOLIC PANEL
BUN: 31 — AB (ref 4–21)
CO2: 27 — AB (ref 13–22)
Chloride: 103 (ref 99–108)
Creatinine: 1 (ref 0.5–1.1)
Glucose: 120
Potassium: 5.7 — AB (ref 3.4–5.3)
Sodium: 138 (ref 137–147)

## 2021-02-26 LAB — COMPREHENSIVE METABOLIC PANEL
Albumin: 4.5 (ref 3.5–5.0)
Calcium: 9.8 (ref 8.7–10.7)

## 2021-02-26 LAB — HEPATIC FUNCTION PANEL
ALT: 25 (ref 7–35)
AST: 30 (ref 13–35)
Alkaline Phosphatase: 75 (ref 25–125)
Bilirubin, Total: 0.5

## 2021-02-26 LAB — CBC
MCV: 96 (ref 81–99)
RBC: 3.84 — AB (ref 3.87–5.11)

## 2021-02-26 NOTE — Progress Notes (Signed)
[place]  [address] [place],  [place]  [address] [phone]  Clinic Day:  02/26/2021  Referring physician: [name], FNP  ASSESSMENT & PLAN:   Assessment & Plan: Hypokalemia Potassium is elevated today at 5.7 with hemolysis noted, although not clinically significant. She is seeing her surgeon this week for pre-op and will have those labs repeated.   Dehydration BUN 31 today. She knows to push fluids at home.   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) She most recently had her breast reconstruction performed and is healing well. She is going back to surgery for placement of permanent implants and will hold HER2 treatment prior to and 2-3 weeks after. She has a pre-op appointment scheduled for 10/14 and will let us know her surgery date so that we can get her back on the schedule.     The patient understands the plans discussed today and is in agreement with them.  She knows to contact our office if she develops concerns prior to her next appointment.     [name], NP  [facility] [address] [place] [address] Dept: [phone] Dept Fax: [phone]   No orders of the defined types were placed in this encounter.     CHIEF COMPLAINT:  CC: A 70 year old female with history of breast cancer here for evaluation prior to HER2 treatment   Current Treatment:  Trastuzumab/ Pertuzumab IV every 3 weeks x 11 cycles   HISTORY OF PRESENT ILLNESS:   Oncology History  Breast cancer in female [facility])  11/03/2002 Initial Diagnosis   Breast cancer in female [facility])   11/03/2002 Cancer Staging   Staging form: Breast, AJCC 6th Edition - Clinical stage from 11/03/2002: Stage IIIA (T2, N2, M0) - Signed by [name], MD on 06/04/2020 Staging comments: Treated with AC x4, Taxol x4, anastrazole x 10 years, completed April 2015 Prognostic  indicators: ER/PR pos, HER2 neg, age 60 at dx, had re-excision for pos margin   Local recurrence of cancer of left breast ([place])  06/08/2020 Initial Diagnosis   Local recurrence of cancer of right breast ([place])   06/20/2020 Cancer Staging   Staging form: Breast, AJCC 8th Edition - Clinical stage from 06/20/2020: Stage IIA (rcT2, cN0, cM0, G3, ER+, PR-, HER2+) - Signed by [name], MD on 06/27/2020 Histopathologic type: Carcinoma, NOS Stage prefix: Recurrence Method of lymph node assessment: Clinical Nuclear grade: G3 Multigene prognostic tests performed: None Menopausal status: Postmenopausal Ki-67 (%): 25 Stage used in treatment planning: Yes National guidelines used in treatment planning: Yes Type of national guideline used in treatment planning: NCCN Staging comments: 3.2 total size, incl satellite lesion.  Arising in adipose tissue so susp for recurrence but now HER @ positive   07/03/2020 - 10/22/2020 Chemotherapy    Patient is on Treatment Plan: BREAST TRASTUZUMAB + PERTUZUMAB Q21D X 11 CYCLES       11/08/2020 -  Chemotherapy   Patient is on Treatment Plan : BREAST Trastuzumab + Pertuzumab q21d x 11 cycles     12/13/2020 Cancer Staging   Staging form: Breast, AJCC 8th Edition - Pathologic stage from 12/13/2020: No Stage Recommended (ypT1c, pN0, cM0, G2, ER+, PR-, HER2+) - Signed by [name], MD on 01/10/2021 Histopathologic type: Carcinoma, NOS Stage prefix: Post-therapy Response to neoadjuvant therapy: Partial response Method of lymph node assessment: Clinical Nuclear grade: G2 Multigene prognostic tests performed: None  Histologic grading system: 3 grade system Residual tumor (R): R0 - None Laterality: Left Tumor size (mm): 15 Lymph-vascular invasion (LVI): LVI not present (absent)/not identified Diagnostic confirmation: Positive histology Specimen type: Excision Staged by: Managing physician Menopausal status: Postmenopausal Ki-67 (%): 25 Stage used in  treatment planning: Yes National guidelines used in treatment planning: Yes Type of national guideline used in treatment planning: NCCN   Hypomagnesemia     INTERVAL HISTORY:  Maciel is here today for repeat clinical assessment. She has been well since last visit and healing well from reconstruction surgery. She will go back to surgery for permanent implants. This is scheduled for the next couple of weeks. We will hold cycle 4 this week until 2-3 weeks after surgery. She denies fever, chills, nausea or vomiting. She denies issue with bowel or bladder. She denies shortness of breath, chest pain or cough. She is undergoing physical therapy for hip and back pain. She states this has greatly improved her pain. CBC today is unremarkable. CMP today reveals potassium 5.7 and BUN 31. She will have these repeated this week at her pre-op appointment.  REVIEW OF SYSTEMS:  Review of Systems  Constitutional:  Negative for appetite change, chills, diaphoresis, fatigue, fever and unexpected weight change.  HENT:   Negative for hearing loss, lump/mass, mouth sores, nosebleeds, sore throat, tinnitus, trouble swallowing and voice change.   Eyes:  Negative for eye problems and icterus.  Respiratory:  Negative for chest tightness, cough, hemoptysis, shortness of breath and wheezing.   Cardiovascular:  Negative for chest pain, leg swelling and palpitations.  Gastrointestinal:  Negative for abdominal distention, abdominal pain, blood in stool, constipation, diarrhea, nausea, rectal pain and vomiting.  Endocrine: Negative for hot flashes.  Genitourinary:  Negative for bladder incontinence, difficulty urinating, dyspareunia, dysuria, frequency, hematuria and nocturia.   Musculoskeletal:  Negative for arthralgias, back pain, flank pain, gait problem, myalgias, neck pain and neck stiffness.  Skin:  Negative for itching, rash and wound.  Neurological:  Negative for dizziness, extremity weakness, gait problem, headaches,  light-headedness, numbness, seizures and speech difficulty.  Hematological:  Negative for adenopathy. Does not bruise/bleed easily.  Psychiatric/Behavioral:  Negative for confusion, decreased concentration, depression, sleep disturbance and suicidal ideas. The patient is not nervous/anxious.     VITALS:  Blood pressure (!) 145/63, pulse 96, temperature 98.6 F (37 C), temperature source Oral, resp. rate 18, height 5' 2"  (1.575 m), weight 139 lb 8 oz (63.3 kg), SpO2 99 %.  Wt Readings from Last 3 Encounters:  02/26/21 139 lb 8 oz (63.3 kg)  02/05/21 138 lb 12.8 oz (63 kg)  01/14/21 135 lb (61.2 kg)    Body mass index is 25.51 kg/m.  Performance status (ECOG): 1 - Symptomatic but completely ambulatory  PHYSICAL EXAM:  Physical Exam Constitutional:      General: She is not in acute distress.    Appearance: Normal appearance. She is normal weight. She is not ill-appearing, toxic-appearing or diaphoretic.  HENT:     Head: Normocephalic and atraumatic.     Nose: Nose normal. No congestion or rhinorrhea.     Mouth/Throat:     Mouth: Mucous membranes are moist.     Pharynx: Oropharynx is clear. No oropharyngeal exudate or posterior oropharyngeal erythema.  Eyes:     General: No scleral icterus.       Right eye: No discharge.        Left eye: No discharge.     Extraocular Movements: Extraocular movements intact.  Conjunctiva/sclera: Conjunctivae normal.     Pupils: Pupils are equal, round, and reactive to light.  Neck:     Vascular: No carotid bruit.  Cardiovascular:     Rate and Rhythm: Normal rate and regular rhythm.     Heart sounds: No murmur heard.   No friction rub. No gallop.  Pulmonary:     Effort: Pulmonary effort is normal. No respiratory distress.     Breath sounds: Normal breath sounds. No stridor. No wheezing, rhonchi or rales.  Chest:     Chest wall: No tenderness.  Abdominal:     General: Abdomen is flat. Bowel sounds are normal. There is no distension.      Palpations: There is no mass.     Tenderness: There is no abdominal tenderness. There is no right CVA tenderness, left CVA tenderness, guarding or rebound.     Hernia: No hernia is present.  Musculoskeletal:        General: No swelling, tenderness, deformity or signs of injury. Normal range of motion.     Cervical back: Normal range of motion and neck supple. No rigidity or tenderness.     Right lower leg: No edema.     Left lower leg: No edema.  Lymphadenopathy:     Cervical: No cervical adenopathy.  Skin:    General: Skin is warm and dry.     Capillary Refill: Capillary refill takes less than 2 seconds.     Coloration: Skin is not jaundiced or pale.     Findings: No bruising, erythema, lesion or rash.  Neurological:     General: No focal deficit present.     Mental Status: She is alert and oriented to person, place, and time. Mental status is at baseline.     Cranial Nerves: No cranial nerve deficit.     Sensory: No sensory deficit.     Motor: No weakness.     Coordination: Coordination normal.     Gait: Gait normal.     Deep Tendon Reflexes: Reflexes normal.  Psychiatric:        Mood and Affect: Mood normal.        Behavior: Behavior normal.        Thought Content: Thought content normal.        Judgment: Judgment normal.    LABS:   CBC Latest Ref Rng & Units 02/26/2021 02/05/2021 01/10/2021  WBC - 7.4 4.4 5.3  Hemoglobin 12.0 - 16.0 12.4 11.5(A) 11.9(A)  Hematocrit 36 - 46 37 35(A) 36  Platelets 150 - 399 261 230 236   CMP Latest Ref Rng & Units 02/26/2021 02/05/2021 01/10/2021  Glucose 70 - 99 mg/dL - - -  BUN 4 - 21 31(A) 21 22(A)  Creatinine 0.5 - 1.1 1.0 0.7 0.6  Sodium 137 - 147 138 139 138  Potassium 3.4 - 5.3 5.7(A) 4.7 4.8  Chloride 99 - 108 103 103 102  CO2 13 - 22 27(A) 27(A) 25(A)  Calcium 8.7 - 10.7 9.8 9.6 10.2  Alkaline Phos 25 - 125 75 85 88  AST 13 - 35 30 31 28   ALT 7 - 35 25 25 16      No results found for: CEA1 / No results found for: CEA1 No  results found for: PSA1 No results found for: JGO115 No results found for: CAN125  No results found for: TOTALPROTELP, ALBUMINELP, A1GS, A2GS, BETS, BETA2SER, GAMS, MSPIKE, SPEI No results found for: TIBC, FERRITIN, IRONPCTSAT No results found for: LDH  STUDIES:  No results found.    HISTORY:   Past Medical History:  Diagnosis Date   Cancer ([facility])    Depression    Diabetes mellitus without complication ([place])    Hyperlipidemia    Hypomagnesemia 08/10/2020   Sleep apnea     Past Surgical History:  Procedure Laterality Date   ABDOMINAL HYSTERECTOMY     BREAST RECONSTRUCTION WITH PLACEMENT OF TISSUE EXPANDER AND FLEX HD (ACELLULAR HYDRATED DERMIS) Bilateral 12/11/2020   Procedure: BILATERAL BREAST RECONSTRUCTION WITH PLACEMENT OF TISSUE EXPANDER AND FLEX HD (ACELLULAR HYDRATED DERMIS);  Surgeon: [name], MD;  Location: [facility];  Service: Plastics;  Laterality: Bilateral;   BREAST SURGERY Left    HYSTERECTOMY ABDOMINAL WITH SALPINGO-OOPHORECTOMY  07/2003   due to fibroids   NIPPLE SPARING MASTECTOMY Bilateral 12/11/2020   Procedure: BILATERAL NIPPLE SPARING MASTECTOMY;  Surgeon: [name], MD;  Location: [facility];  Service: General;  Laterality: Bilateral;   PORTACATH PLACEMENT Right 06/26/2020   Procedure: INSERTION PORT-A-CATH;  Surgeon: [name], MD;  Location: [facility];  Service: General;  Laterality: Right;   TRIGGER FINGER RELEASE      Family History  Problem Relation Age of Onset   Breast cancer Mother 68   Breast cancer Maternal Aunt 25   Ovarian cancer Maternal Grandmother 70   Bone cancer Maternal Grandfather        60s   Cancer Maternal Aunt 55    Social History:  reports that she has never smoked. She has never used smokeless tobacco. She reports current alcohol use. No history on file for drug use.The patient is alone  today.  Allergies:  Allergies  Allergen Reactions   Oxycodone  Itching   Codeine Other (See Comments)    Other reaction(s): Other (See Comments) Unknown Unknown    Lisinopril     Other reaction(s): Cough (ALLERGY/intolerance)    Current Medications: Current Outpatient Medications  Medication Sig Dispense Refill   ACCU-CHEK GUIDE test strip daily. for testing as directed     aspirin 81 MG chewable tablet Chew by mouth.     benzonatate (TESSALON) 100 MG capsule      calcium-vitamin D 250-100 MG-UNIT tablet Take by mouth.     diphenoxylate-atropine (LOMOTIL) 2.5-0.025 MG tablet Take 2 tablets by mouth 4 (four) times daily as needed for diarrhea or loose stools. 60 tablet 3   Docusate Sodium (DSS) 100 MG CAPS Take by mouth.     DULoxetine (CYMBALTA) 60 MG capsule Take 1 capsule by mouth daily.     FLUoxetine (PROZAC) 40 MG capsule Take by mouth.     gabapentin (NEURONTIN) 300 MG capsule TAKE 1 CAPSULE BY MOUTH THREE TIMES A DAY 90 capsule 0   HYDROcodone-acetaminophen (NORCO) 7.5-325 MG tablet TAKE 1 TABLET BY MOUTH EVERY 4 HOURS AS NEEDED FOR MODERATE PAIN 120 tablet 0   HYDROmorphone (DILAUDID) 2 MG tablet Take 0.5 tablets (1 mg total) by mouth every 4 (four) hours as needed for severe pain. 25 tablet 0   LORazepam (ATIVAN) 0.5 MG tablet Take 1 tablet (0.5 mg total) by mouth every 6 (six) hours as needed for anxiety. 90 tablet 0   losartan (COZAAR) 50 MG tablet daily.     magnesium oxide (MAG-OX) 400 (241.3 Mg) MG tablet Take 1 tablet (400 mg total) by mouth 2 (two) times daily. 60 tablet 1   meclizine (ANTIVERT) 25 MG tablet Take 1 tablet (25 mg total) by mouth 3 (three) times daily as  needed for dizziness. 30 tablet 0   meloxicam (MOBIC) 15 MG tablet Take 1 tablet (15 mg total) by mouth daily. 30 tablet 5   metFORMIN (GLUCOPHAGE) 500 MG tablet 2 (two) times daily.     Multiple Vitamins-Minerals (THERA-M) TABS Take by mouth.     prochlorperazine (COMPAZINE) 10 MG tablet Take 1 tablet (10 mg total) by mouth every 6 (six) hours as needed (Nausea or  vomiting). 30 tablet 1   rosuvastatin (CRESTOR) 40 MG tablet daily.     traMADol (ULTRAM) 50 MG tablet Take 1-2 tablets (50-100 mg total) by mouth every 6 (six) hours as needed. 60 tablet 0   vitamin B-12 (CYANOCOBALAMIN) 500 MCG tablet Take 500 mcg by mouth daily.     No current facility-administered medications for this visit.   Facility-Administered Medications Ordered in Other Visits  Medication Dose Route Frequency Provider Last Rate Last Admin   acetaminophen (TYLENOL) tablet 650 mg  650 mg Oral Once [name], MD       diphenhydrAMINE (BENADRYL) capsule 25 mg  25 mg Oral Once [name], MD       sodium chloride flush (NS) 0.9 % injection 10 mL  10 mL Intracatheter PRN [name], MD   10 mL at 01/14/21 1624

## 2021-02-26 NOTE — Assessment & Plan Note (Signed)
BUN 31 today. She knows to push fluids at home.

## 2021-02-26 NOTE — Assessment & Plan Note (Signed)
Potassium is elevated today at 5.7 with hemolysis noted, although not clinically significant. She is seeing her surgeon this week for pre-op and will have those labs repeated.

## 2021-02-26 NOTE — Assessment & Plan Note (Signed)
She most recently had her breast reconstruction performed and is healing well. She is going back to surgery for placement of permanent implants and will hold HER2 treatment prior to and 2-3 weeks after. She has a pre-op appointment scheduled for 10/14 and will let us know her surgery date so that we can get her back on the schedule.

## 2021-02-26 NOTE — Patient Instructions (Signed)
Access Code: [email or web address] URL: [email or web address] Date: 02/26/2021 Prepared by: Manus Gunning  Exercises ITB Stretch at [employer] - 1 x daily - 7 x weekly - 1 sets - 3 reps - 30 sec hold ITB Stretch at [place] (Mirrored) - 1 x daily - 7 x weekly - 1 sets - 3 reps - 30 sec hold Seated Piriformis Stretch with Trunk Bend - 1 x daily - 7 x weekly - 1 sets - 3 reps - 30 sec hold Seated Piriformis Stretch with Trunk Bend (Mirrored) - 1 x daily - 7 x weekly - 1 sets - 3 reps - 30 sec hold Seated Hamstring Stretch - 1 x daily - 7 x weekly - 1 sets - 3 reps - 30 sec hold Seated Hamstring Stretch (Mirrored) - 1 x daily - 7 x weekly - 1 sets - 3 reps - 30 sec hold Standing Hip Flexion with Counter Support - 1 x daily - 7 x weekly - 1 sets - 10 reps Standing Hip Flexion with Counter Support (Mirrored) - 1 x daily - 7 x weekly - 1 sets - 10 reps Standing Hip Extension with Counter Support - 1 x daily - 7 x weekly - 1 sets - 10 reps Standing Hip Extension with Counter Support (Mirrored) - 1 x daily - 7 x weekly - 1 sets - 10 reps Standing Hip Abduction with Counter Support - 1 x daily - 7 x weekly - 1 sets - 10 reps Standing Hip Abduction with Counter Support (Mirrored) - 1 x daily - 7 x weekly - 1 sets - 10 reps

## 2021-02-26 NOTE — Therapy (Signed)
[facility] @ [place], [place], [address] Phone: [phone]   Fax:  [phone]  Physical Therapy Treatment  Patient Details  Name: Tammy Fowler MRN: [id number] Date of Birth: 1951-01-01 Referring Provider (PT): Dr. Donne Hazel   Encounter Date: 02/26/2021   PT End of Session - 02/26/21 1359     Visit Number 3    Number of Visits 10    Date for PT Re-Evaluation 03/06/21    PT Start Time 3354   pt arrived late   PT Stop Time 1354    PT Time Calculation (min) 42 min    Activity Tolerance Patient tolerated treatment well    Behavior During Therapy [facility] for tasks assessed/performed             Past Medical History:  Diagnosis Date   Cancer ([place])    Depression    Diabetes mellitus without complication ([place])    Hyperlipidemia    Hypomagnesemia 08/10/2020   Sleep apnea     Past Surgical History:  Procedure Laterality Date   ABDOMINAL HYSTERECTOMY     BREAST RECONSTRUCTION WITH PLACEMENT OF TISSUE EXPANDER AND FLEX HD (ACELLULAR HYDRATED DERMIS) Bilateral 12/11/2020   Procedure: BILATERAL BREAST RECONSTRUCTION WITH PLACEMENT OF TISSUE EXPANDER AND FLEX HD (ACELLULAR HYDRATED DERMIS);  Surgeon: [name], MD;  Location: [facility];  Service: Plastics;  Laterality: Bilateral;   BREAST SURGERY Left    HYSTERECTOMY ABDOMINAL WITH SALPINGO-OOPHORECTOMY  07/2003   due to fibroids   NIPPLE SPARING MASTECTOMY Bilateral 12/11/2020   Procedure: BILATERAL NIPPLE SPARING MASTECTOMY;  Surgeon: [name], MD;  Location: [facility];  Service: General;  Laterality: Bilateral;   PORTACATH PLACEMENT Right 06/26/2020   Procedure: INSERTION PORT-A-CATH;  Surgeon: [name], MD;  Location: [facility];  Service: General;  Laterality: Right;   TRIGGER FINGER RELEASE      There were no vitals filed for this visit.   Subjective Assessment - 02/26/21  1314     Subjective I went back to the doctor and had another fill. I have to wait about 4 more weeks then they will plan my surgery.    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 Yes    Pain Score 3     Pain Location Knee    Pain Orientation Left;Right    Pain Descriptors / Indicators Sore    Pain Type Chronic pain    Pain Onset More than a month ago    Pain Frequency Constant    Aggravating Factors  walking    Pain Relieving Factors rest    Effect of Pain on Daily Activities limit ability to walk but wears braces on knees in order to complete ADLs                               [facility] Adult PT Treatment/Exercise - 02/26/21 0001       Knee/Hip Exercises: Stretches   Active Hamstring Stretch  Both;1 rep;30 seconds   seated in chair   ITB Stretch 1 rep;Both;30 seconds    Other Knee/Hip Stretches seated piriformin stretch x 30 sec hold bilaterally      Knee/Hip Exercises: Machines for Strengthening   Other Machine FreeMotion cables: 3 lbs bilaterally in to flexion, abduction, and extension (knee bent) x 10 reps each with pt reporting discomfort in IT band in supporting leg      Manual Therapy   Manual Therapy Soft tissue mobilization    Soft tissue mobilization using cocoa butter to bilateral IT band to decrease tightness with improvements noted                          PT Long Term Goals - 02/06/21 1607       PT LONG TERM GOAL #1   Title Pt will demonstrate 165 degrees of bilateral shoulder flexion to allow her to reach overhead.     Baseline R 144, L 142; 02/06/21- R 175, L 173    Time 4    Period Weeks    Status Achieved      PT LONG TERM GOAL #2   Title Pt will demonstrate 165 degrees of bilateral shoulder abduction to allow her to reach out to the side.    Baseline R 126, L 110; 02/06/21- R 176, L 175    Time 4    Period Weeks    Status Achieved      PT LONG TERM GOAL #3   Title Pt will be independent in a home exercise program for continued strengthening and stretching.    Period Weeks    Status On-going      PT LONG TERM GOAL #4   Title Pt will demonstrate 5/5 bilateral ankle dorsiflexion strength to decrease risk of falls    Baseline R 3+/5, L 4+/5    Time 4    Period Weeks    Status New    Target Date 03/06/21      PT LONG TERM GOAL #5   Title Pt will demonstrate 5/5 bilateral hip flexion strength to decrease risk of falls    Baseline R and L 3/5    Time 4    Period Weeks    Status New      Additional Long Term Goals   Additional Long Term Goals Yes      PT LONG TERM GOAL #6   Title Pt will report decreased discomfort in bilateral trunk to palpation to allow improved comfort.    Baseline pt has discomfort and soreness in bilateral trunk in area of serratus    Time 4    Period Weeks    Status New    Target Date 03/06/21                   Plan - 02/26/21 1350     Clinical Impression Statement Pt returns to PT to begin exercising today. Began hip strengthening on cable free motion cable machine today in direction of flexion, abduction and extension at 3 lbs. Pt then was instructed in ITB, piriformis and hamstring stretches. She was having increased pain in her IT band during hip strengthening so performed soft tissue mobilization to this area bilaterally which improved the tightness and discomfort. Educated pt to continue stretching and massage at home. Next session will begin high level balance exercises.    PT Frequency 2x / week    PT Duration 4 weeks  PT Treatment/Interventions  ADLs/Self Care Home Management;Therapeutic activities;Therapeutic exercise;Balance training;Neuromuscular re-education;Patient/family education;Manual techniques;Manual lymph drainage;Scar mobilization;Passive range of motion;Taping    PT Next Visit Plan how is HEP, any soreness? ITB tightness? cont ankle and hip flexor strengthening, high level balance activities with eyes closed/open/uneven surfaces/ tandem walking etc, STM to bilateral serratus    PT Home Exercise Plan sit to stands, standing on 1 leg; Access Code: [email or web address]    Consulted and Agree with Plan of Care Patient             Patient will benefit from skilled therapeutic intervention in order to improve the following deficits and impairments:  Postural dysfunction, Impaired UE functional use, Impaired flexibility, Increased fascial restricitons, Decreased strength, Decreased activity tolerance, Decreased range of motion, Decreased scar mobility, Increased edema, Decreased balance, Decreased endurance, Decreased mobility, Difficulty walking, Increased muscle spasms  Visit Diagnosis: Difficulty in walking, not elsewhere classified  Muscle weakness (generalized)  Other muscle spasm  Stiffness of left shoulder, not elsewhere classified  Stiffness of right shoulder, not elsewhere classified  Aftercare following surgery for neoplasm  Abnormal posture     Problem List Patient Active Problem List   Diagnosis Date Noted   Nausea without vomiting 10/24/2020   Hypokalemia 08/23/2020   Dehydration 08/23/2020   Hypomagnesemia 08/10/2020   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

## 2021-02-27 ENCOUNTER — Encounter: Payer: Self-pay | Admitting: Oncology

## 2021-02-27 ENCOUNTER — Telehealth: Payer: Self-pay | Admitting: Oncology

## 2021-02-27 NOTE — Telephone Encounter (Signed)
Per 10/11 LOS, patient will call back to scheduled due to waiting surgery Appt Date.  2-3 weeks after surgery

## 2021-02-28 ENCOUNTER — Other Ambulatory Visit: Payer: Self-pay

## 2021-02-28 ENCOUNTER — Ambulatory Visit: Payer: Medicare Other | Admitting: Physical Therapy

## 2021-02-28 DIAGNOSIS — M25612 Stiffness of left shoulder, not elsewhere classified: Secondary | ICD-10-CM

## 2021-02-28 DIAGNOSIS — M62838 Other muscle spasm: Secondary | ICD-10-CM

## 2021-02-28 DIAGNOSIS — R293 Abnormal posture: Secondary | ICD-10-CM

## 2021-02-28 DIAGNOSIS — R262 Difficulty in walking, not elsewhere classified: Secondary | ICD-10-CM | POA: Diagnosis not present

## 2021-02-28 DIAGNOSIS — Z483 Aftercare following surgery for neoplasm: Secondary | ICD-10-CM

## 2021-02-28 DIAGNOSIS — M25611 Stiffness of right shoulder, not elsewhere classified: Secondary | ICD-10-CM

## 2021-02-28 DIAGNOSIS — M6281 Muscle weakness (generalized): Secondary | ICD-10-CM

## 2021-02-28 NOTE — Patient Instructions (Signed)
Access Code: [email or web address] URL: [email or web address] Date: 02/28/2021 Prepared by: Maudry Diego  Exercises Sidelying Hip Abduction - 1 x daily - 7 x weekly - 1 sets - 10 reps Clamshell - 1 x daily - 7 x weekly - 3 sets - 10 reps Pilates Bridge - 1 x daily - 7 x weekly - 3 sets - 10 reps Supine Active Straight Leg Raise - 1 x daily - 7 x weekly - 3 sets - 10 reps

## 2021-02-28 NOTE — Therapy (Signed)
[facility] @ [place], [place], [address] Phone: [phone]   Fax:  [phone]  Physical Therapy Treatment  Patient Details  Name: Tammy Fowler MRN: [id number] Date of Birth: 02-24-1951 Referring Provider (PT): Dr. Donne Hazel   Encounter Date: 02/28/2021   PT End of Session - 02/28/21 1252     Visit Number 4    Number of Visits 10    Date for PT Re-Evaluation 03/06/21    PT Start Time 6962    PT Stop Time 1250    PT Time Calculation (min) 55 min    Activity Tolerance Patient tolerated treatment well    Behavior During Therapy [facility] for tasks assessed/performed             Past Medical History:  Diagnosis Date   Cancer ([place])    Depression    Diabetes mellitus without complication ([place])    Hyperlipidemia    Hypomagnesemia 08/10/2020   Sleep apnea     Past Surgical History:  Procedure Laterality Date   ABDOMINAL HYSTERECTOMY     BREAST RECONSTRUCTION WITH PLACEMENT OF TISSUE EXPANDER AND FLEX HD (ACELLULAR HYDRATED DERMIS) Bilateral 12/11/2020   Procedure: BILATERAL BREAST RECONSTRUCTION WITH PLACEMENT OF TISSUE EXPANDER AND FLEX HD (ACELLULAR HYDRATED DERMIS);  Surgeon: [name], MD;  Location: [facility];  Service: Plastics;  Laterality: Bilateral;   BREAST SURGERY Left    HYSTERECTOMY ABDOMINAL WITH SALPINGO-OOPHORECTOMY  07/2003   due to fibroids   NIPPLE SPARING MASTECTOMY Bilateral 12/11/2020   Procedure: BILATERAL NIPPLE SPARING MASTECTOMY;  Surgeon: [name], MD;  Location: [facility];  Service: General;  Laterality: Bilateral;   PORTACATH PLACEMENT Right 06/26/2020   Procedure: INSERTION PORT-A-CATH;  Surgeon: [name], MD;  Location: [facility];  Service: General;  Laterality: Right;   TRIGGER FINGER RELEASE      There were no vitals filed for this visit.   Subjective Assessment - 02/28/21 1153      Subjective Pt says that she is still having problems with her knees. " the chemotherapy targeted my knees" She is having lots of cramping in her back of the legs and toes  pt says she lives 90 minutes away form our clinic and would like to get her PT closer to home in [place]  she still has a 10 pound lifting restriction on her arms    Pertinent History bilateral mastectomy with expander placement in 12/11/20, pt had recurrence of previous breast cancer in Jan 2022, had drains removed 2 weeks ago, pt is currently undergoing chemo, 2004 L breast cancer stage IIIA with ALND (17 nodes removed some positive), completed chemo and radiation, had lat flap at that time, had lift on the R, total hysterectomy in 2005    Patient Stated Goals to improve ROM of bilateral shoulders and to get her knees stronger    Currently in Pain? Yes    Pain Score 5    when she is up and walking   Pain Location Knee    Pain Orientation Right;Left    Pain Descriptors / Indicators Aching                               [facility] Adult PT Treatment/Exercise - 02/28/21 0001       Exercises   Exercises Knee/Hip;Lumbar;Other Exercises      Lumbar Exercises: Stretches   Other  Lumbar Stretch Exercise pelvic tilts to bridges x  5      Lumbar Exercises: Aerobic   Nustep 10  minutes at level 2   no pain in knee   Other Aerobic Exercise [facility]      Knee/Hip Exercises: Standing   Other Standing Knee Exercises sit to stand from high mat with 5 pound weight at chest      Knee/Hip Exercises: Supine   Straight Leg Raises Strengthening;Right;Left;2 sets;5 reps   4 parts, tighten , lift lower, relax                    PT Education - 02/28/21 1251     Education Details supine and sidelying hip exercises    Person(s) Educated Patient    Methods Explanation;Demonstration;Handout   Medbridge program                PT Long Term Goals - 02/06/21 4854        PT LONG TERM GOAL #1   Title Pt will demonstrate 165 degrees of bilateral shoulder flexion to allow her to reach overhead.    Baseline R 144, L 142; 02/06/21- R 175, L 173    Time 4    Period Weeks    Status Achieved      PT LONG TERM GOAL #2   Title Pt will demonstrate 165 degrees of bilateral shoulder abduction to allow her to reach out to the side.    Baseline R 126, L 110; 02/06/21- R 176, L 175    Time 4    Period Weeks    Status Achieved      PT LONG TERM GOAL #3   Title Pt will be independent in a home exercise program for continued strengthening and stretching.    Period Weeks    Status On-going      PT LONG TERM GOAL #4   Title Pt will demonstrate 5/5 bilateral ankle dorsiflexion strength to decrease risk of falls    Baseline R 3+/5, L 4+/5    Time 4    Period Weeks    Status New    Target Date 03/06/21      PT LONG TERM GOAL #5   Title Pt will demonstrate 5/5 bilateral hip flexion strength to decrease risk of falls    Baseline R and L 3/5    Time 4    Period Weeks    Status New      Additional Long Term Goals   Additional Long Term Goals Yes      PT LONG TERM GOAL #6   Title Pt will report decreased discomfort in bilateral trunk to palpation to allow improved comfort.    Baseline pt has discomfort and soreness in bilateral trunk in area of serratus    Time 4    Period Weeks    Status New    Target Date 03/06/21                   Plan - 02/28/21 1252     Clinical Impression Statement Pt cotinues to have problems with pain in her knees with weight bearing activiites. Focused on LE Exercise in supine, sidlying and sitting today and pt tolerated well with good form She will look into getting PT in [place] ( much closer to [facility] home) [facility]    Personal Factors and Comorbidities Fitness;Comorbidity 1  Comorbidities previous hx of breast cancer and chemo and radiaiton    Examination-Activity Limitations Lift;Carry;Reach  Overhead    Stability/Clinical Decision Making Stable/Uncomplicated    Clinical Decision Making Low    Rehab Potential Poor    PT Frequency 2x / week    PT Duration 4 weeks    PT Treatment/Interventions ADLs/Self Care Home Management;Therapeutic activities;Therapeutic exercise;Balance training;Neuromuscular re-education;Patient/family education;Manual techniques;Manual lymph drainage;Scar mobilization;Passive range of motion;Taping    PT Next Visit Plan how is HEP, any soreness? ITB tightness? cont ankle and hip flexor strengthening, high level balance activities with eyes closed/open/uneven surfaces/ tandem walking etc, STM to bilateral serratus    Consulted and Agree with Plan of Care Patient             Patient will benefit from skilled therapeutic intervention in order to improve the following deficits and impairments:  Postural dysfunction, Impaired UE functional use, Impaired flexibility, Increased fascial restricitons, Decreased strength, Decreased activity tolerance, Decreased range of motion, Decreased scar mobility, Increased edema, Decreased balance, Decreased endurance, Decreased mobility, Difficulty walking, Increased muscle spasms  Visit Diagnosis: Difficulty in walking, not elsewhere classified  Muscle weakness (generalized)  Other muscle spasm  Stiffness of left shoulder, not elsewhere classified  Stiffness of right shoulder, not elsewhere classified  Aftercare following surgery for neoplasm  Abnormal posture     Problem List Patient Active Problem List   Diagnosis Date Noted   Nausea without vomiting 10/24/2020   Hypokalemia 08/23/2020   Dehydration 08/23/2020   Hypomagnesemia 08/10/2020   Local recurrence of cancer of left breast ([place]) 06/08/2020    Class: Diagnosis of   Osteoporosis 06/04/2020    Class: Chronic   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 Owens Shark PT  Norwood Levo, PT 02/28/2021, 12:55 PM  [facility] @ [place], [place], [address] Phone: [phone]   Fax:  [phone]  Name: Tammy Fowler MRN: [id number] Date of Birth: 12-17-1950

## 2021-03-01 ENCOUNTER — Ambulatory Visit: Payer: Medicare Other

## 2021-03-05 ENCOUNTER — Encounter: Payer: Medicare Other | Admitting: Physical Therapy

## 2021-03-07 ENCOUNTER — Other Ambulatory Visit: Payer: Self-pay | Admitting: Pharmacist

## 2021-03-07 ENCOUNTER — Other Ambulatory Visit: Payer: Self-pay

## 2021-03-07 ENCOUNTER — Encounter: Payer: Self-pay | Admitting: Physical Therapy

## 2021-03-07 ENCOUNTER — Ambulatory Visit: Payer: Medicare Other | Admitting: Physical Therapy

## 2021-03-07 DIAGNOSIS — M25612 Stiffness of left shoulder, not elsewhere classified: Secondary | ICD-10-CM

## 2021-03-07 DIAGNOSIS — M62838 Other muscle spasm: Secondary | ICD-10-CM

## 2021-03-07 DIAGNOSIS — R293 Abnormal posture: Secondary | ICD-10-CM

## 2021-03-07 DIAGNOSIS — R262 Difficulty in walking, not elsewhere classified: Secondary | ICD-10-CM | POA: Diagnosis not present

## 2021-03-07 DIAGNOSIS — M6281 Muscle weakness (generalized): Secondary | ICD-10-CM

## 2021-03-07 DIAGNOSIS — M25611 Stiffness of right shoulder, not elsewhere classified: Secondary | ICD-10-CM

## 2021-03-07 DIAGNOSIS — Z483 Aftercare following surgery for neoplasm: Secondary | ICD-10-CM

## 2021-03-07 NOTE — Therapy (Signed)
[facility] @ [place], [place], [address] Phone: [phone]   Fax:  [phone]  Physical Therapy Treatment  Patient Details  Name: Tammy Fowler MRN: [id number] Date of Birth: 08/12/1950 Referring Provider (PT): Dr. Donne Hazel   Encounter Date: 03/07/2021   PT End of Session - 03/07/21 1453     Visit Number 5    Number of Visits 18    Date for PT Re-Evaluation 04/04/21    PT Start Time 8841    PT Stop Time 1453    PT Time Calculation (min) 48 min    Activity Tolerance Patient tolerated treatment well    Behavior During Therapy [facility] for tasks assessed/performed             Past Medical History:  Diagnosis Date   Cancer ([place])    Depression    Diabetes mellitus without complication ([place])    Hyperlipidemia    Hypomagnesemia 08/10/2020   Sleep apnea     Past Surgical History:  Procedure Laterality Date   ABDOMINAL HYSTERECTOMY     BREAST RECONSTRUCTION WITH PLACEMENT OF TISSUE EXPANDER AND FLEX HD (ACELLULAR HYDRATED DERMIS) Bilateral 12/11/2020   Procedure: BILATERAL BREAST RECONSTRUCTION WITH PLACEMENT OF TISSUE EXPANDER AND FLEX HD (ACELLULAR HYDRATED DERMIS);  Surgeon: [name], MD;  Location: [facility];  Service: Plastics;  Laterality: Bilateral;   BREAST SURGERY Left    HYSTERECTOMY ABDOMINAL WITH SALPINGO-OOPHORECTOMY  07/2003   due to fibroids   NIPPLE SPARING MASTECTOMY Bilateral 12/11/2020   Procedure: BILATERAL NIPPLE SPARING MASTECTOMY;  Surgeon: [name], MD;  Location: [facility];  Service: General;  Laterality: Bilateral;   PORTACATH PLACEMENT Right 06/26/2020   Procedure: INSERTION PORT-A-CATH;  Surgeon: [name], MD;  Location: [facility];  Service: General;  Laterality: Right;   TRIGGER FINGER RELEASE      There were no vitals filed for this visit.   Subjective Assessment - 03/07/21 1406      Subjective I feel like I am doing good. I can tell when I do them. I need to be diligent and do it every day.    Pertinent History bilateral mastectomy with expander placement in 12/11/20, pt had recurrence of previous breast cancer in Jan 2022, had drains removed 2 weeks ago, pt is currently undergoing chemo, 2004 L breast cancer stage IIIA with ALND (17 nodes removed some positive), completed chemo and radiation, had lat flap at that time, had lift on the R, total hysterectomy in 2005    Patient Stated Goals to improve ROM of bilateral shoulders and to get her knees stronger    Currently in Pain? Yes    Pain Score 2     Pain Location Knee    Pain Orientation Right;Left    Pain Descriptors / Indicators Aching    Pain Type Chronic pain    Pain Onset More than a month ago    Pain Frequency Constant    Aggravating Factors  walking    Pain Relieving Factors rest    Effect of Pain on Daily Activities limit ability to walk but wears braces on knees in order to complete ADLs                               [facility] Adult PT Treatment/Exercise - 03/07/21 0001       Lumbar Exercises: Aerobic   Nustep  10  minutes at level 3   no pain in knee     Knee/Hip Exercises: Stretches   Gastroc Stretch Both;1 rep;60 seconds    Other Knee/Hip Stretches instructed pt in how to manually stretch toes especially great toe on the left which has decreased mobility      Knee/Hip Exercises: Standing   Other Standing Knee Exercises with red theraband tied around leg of table: 4 way hip with red band x 10 reps in each direction using walker to stabilize                          PT Long Term Goals - 03/07/21 1408       PT LONG TERM GOAL #1   Title Pt will demonstrate 165 degrees of bilateral shoulder flexion to allow her to reach overhead.    Baseline R 144, L 142; 02/06/21- R 175, L 173    Time 4    Period Weeks    Status Achieved      PT LONG TERM GOAL #2   Title Pt will  demonstrate 165 degrees of bilateral shoulder abduction to allow her to reach out to the side.    Baseline R 126, L 110; 02/06/21- R 176, L 175    Time 4    Period Weeks    Status Achieved      PT LONG TERM GOAL #3   Title Pt will be independent in a home exercise program for continued strengthening and stretching.    Time 4    Period Weeks    Status On-going      PT LONG TERM GOAL #4   Title Pt will demonstrate 5/5 bilateral ankle dorsiflexion strength to decrease risk of falls    Baseline R 3+/5, L 4+/5; 03/07/21- 5/5    Time 4    Period Weeks    Status Achieved      PT LONG TERM GOAL #5   Title Pt will demonstrate 5/5 bilateral hip flexion strength to decrease risk of falls    Baseline R and L 3/5; 03/07/21- R 4/5, L 3+/5    Time 4    Period Weeks    Status On-going      PT LONG TERM GOAL #6   Title Pt will report decreased discomfort in bilateral trunk to palpation to allow improved comfort.    Baseline pt has discomfort and soreness in bilateral trunk in area of serratus; 03/07/21- that has completely improved    Time 4    Period Weeks    Status Achieved                   Plan - 03/07/21 1457     Clinical Impression Statement Assessed pt's progress towards goals in therapy and pt has made significant progress towards her goals. Pt has met her ankle dorsiflexion goal and now has 5/5 bilateral ankle dorsiflexor strength. She has progressed towards her hip flexion goal. She has met her shoulder ROM goals and her lateral trunk pain goal. Pt would still benefit from skilled PT services to continue to work on high level balance exercises, continue to improve hip flexor strength and continue to progress pt's home exercise program. Issued theraband for pt to perform 4 way hip exercises at home and also issued gastroc stretch and toe stretches since pt reports frequent toe and calf cramps.    PT Frequency 2x / week    PT Duration  4 weeks    PT Treatment/Interventions  ADLs/Self Care Home Management;Therapeutic activities;Therapeutic exercise;Balance training;Neuromuscular re-education;Patient/family education;Manual techniques;Manual lymph drainage;Scar mobilization;Passive range of motion;Taping    PT Next Visit Plan show pt toe seperator socks, ITB tightness? cont ankle and hip flexor strengthening, high level balance activities with eyes closed/open/uneven surfaces/ tandem walking etc, STM to bilateral serratus    PT Home Exercise Plan sit to stands, standing on 1 leg; Access Code: [email or web address],Access Code [email or web address]    Consulted and Agree with Plan of Care Patient             Patient will benefit from skilled therapeutic intervention in order to improve the following deficits and impairments:  Postural dysfunction, Impaired UE functional use, Impaired flexibility, Increased fascial restricitons, Decreased strength, Decreased activity tolerance, Decreased range of motion, Decreased scar mobility, Increased edema, Decreased balance, Decreased endurance, Decreased mobility, Difficulty walking, Increased muscle spasms  Visit Diagnosis: Difficulty in walking, not elsewhere classified  Muscle weakness (generalized)  Other muscle spasm  Stiffness of left shoulder, not elsewhere classified  Stiffness of right shoulder, not elsewhere classified  Aftercare following surgery for neoplasm  Abnormal posture     Problem List Patient Active Problem List   Diagnosis Date Noted   Nausea without vomiting 10/24/2020   Hypokalemia 08/23/2020   Dehydration 08/23/2020   Hypomagnesemia 08/10/2020   Local recurrence of cancer of left breast ([place]) 06/08/2020    Class: Diagnosis of   Osteoporosis 06/04/2020    Class: Chronic   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

## 2021-03-07 NOTE — Patient Instructions (Signed)
Access Code: [email or web address] URL: [email or web address] Date: 03/07/2021 Prepared by: Manus Gunning  Exercises Gastroc Stretch on Wall - 1 x daily - 7 x weekly - 1 sets - 3 reps - 60 sec hold Gastroc Stretch on Wall (Mirrored) - 1 x daily - 7 x weekly - 1 sets - 3 reps - 60 sec hold Toe Extension and Flexion Caregiver PROM - 1 x daily - 7 x weekly - 1 sets - 3 reps - 60 sec hold Seated Self Great Toe Stretch - 1 x daily - 7 x weekly - 1 sets - 3 reps - 60 sec hold

## 2021-03-12 ENCOUNTER — Encounter: Payer: Medicare Other | Admitting: Physical Therapy

## 2021-03-14 ENCOUNTER — Ambulatory Visit: Payer: Medicare Other | Admitting: Physical Therapy

## 2021-03-14 ENCOUNTER — Encounter: Payer: Self-pay | Admitting: Physical Therapy

## 2021-03-14 ENCOUNTER — Other Ambulatory Visit: Payer: Self-pay

## 2021-03-14 DIAGNOSIS — R262 Difficulty in walking, not elsewhere classified: Secondary | ICD-10-CM

## 2021-03-14 DIAGNOSIS — M6281 Muscle weakness (generalized): Secondary | ICD-10-CM

## 2021-03-14 DIAGNOSIS — M25611 Stiffness of right shoulder, not elsewhere classified: Secondary | ICD-10-CM

## 2021-03-14 DIAGNOSIS — Z483 Aftercare following surgery for neoplasm: Secondary | ICD-10-CM

## 2021-03-14 DIAGNOSIS — M62838 Other muscle spasm: Secondary | ICD-10-CM

## 2021-03-14 DIAGNOSIS — R293 Abnormal posture: Secondary | ICD-10-CM

## 2021-03-14 DIAGNOSIS — M25612 Stiffness of left shoulder, not elsewhere classified: Secondary | ICD-10-CM

## 2021-03-14 NOTE — Therapy (Signed)
[facility] @ [place], [place], [address] Phone: [phone]   Fax:  [phone]  Physical Therapy Treatment  Patient Details  Name: Tammy Fowler MRN: [id number] Date of Birth: 1951/02/14 Referring Provider (PT): Dr. Donne Hazel   Encounter Date: 03/14/2021   PT End of Session - 03/14/21 1458     Visit Number 6    Number of Visits 18    Date for PT Re-Evaluation 04/04/21    PT Start Time 1404    PT Stop Time 1454    PT Time Calculation (min) 50 min    Activity Tolerance Patient tolerated treatment well    Behavior During Therapy [facility] for tasks assessed/performed             Past Medical History:  Diagnosis Date   Cancer ([place])    Depression    Diabetes mellitus without complication ([place])    Hyperlipidemia    Hypomagnesemia 08/10/2020   Sleep apnea     Past Surgical History:  Procedure Laterality Date   ABDOMINAL HYSTERECTOMY     BREAST RECONSTRUCTION WITH PLACEMENT OF TISSUE EXPANDER AND FLEX HD (ACELLULAR HYDRATED DERMIS) Bilateral 12/11/2020   Procedure: BILATERAL BREAST RECONSTRUCTION WITH PLACEMENT OF TISSUE EXPANDER AND FLEX HD (ACELLULAR HYDRATED DERMIS);  Surgeon: [name], MD;  Location: [facility];  Service: Plastics;  Laterality: Bilateral;   BREAST SURGERY Left    HYSTERECTOMY ABDOMINAL WITH SALPINGO-OOPHORECTOMY  07/2003   due to fibroids   NIPPLE SPARING MASTECTOMY Bilateral 12/11/2020   Procedure: BILATERAL NIPPLE SPARING MASTECTOMY;  Surgeon: [name], MD;  Location: [facility];  Service: General;  Laterality: Bilateral;   PORTACATH PLACEMENT Right 06/26/2020   Procedure: INSERTION PORT-A-CATH;  Surgeon: [name], MD;  Location: [facility];  Service: General;  Laterality: Right;   TRIGGER FINGER RELEASE      There were no vitals filed for this visit.   Subjective Assessment - 03/14/21 1406     Subjective I  have not had one calf cramp since I did the stretches. I still occasionally have the toe cramps. My hips are sore from the red band exercises but I used the hot tub.    Pertinent History bilateral mastectomy with expander placement in 12/11/20, pt had recurrence of previous breast cancer in Jan 2022, had drains removed 2 weeks ago, pt is currently undergoing chemo, 2004 L breast cancer stage IIIA with ALND (17 nodes removed some positive), completed chemo and radiation, had lat flap at that time, had lift on the R, total hysterectomy in 2005    Patient Stated Goals to improve ROM of bilateral shoulders and to get her knees stronger    Currently in Pain? Yes    Pain Score 4     Pain Location Knee    Pain Orientation Right;Left    Pain Descriptors / Indicators Aching    Pain Type Chronic pain    Pain Onset More than a month ago    Pain Frequency Constant    Aggravating Factors  walking    Pain Relieving Factors rest    Effect of Pain on Daily Activities limit ability to walk but wears braces on knees in order to copmlete ADLs                               [facility] Adult PT Treatment/Exercise - 03/14/21 0001  Neuro Re-ed    Neuro Re-ed Details  in // bars: standing on airex pad with eyes closed x 30 sec x 2 with feet shoulder width apart with some increased sway, standing eyes closed on airex with feet close together x 30 sec x  with more increased sway, standing on 1 foot x 4 reps with pt able to maintain around 10 sec bilaterally, walking on airex beam x 4 reps      Lumbar Exercises: Machines for Strengthening   Other Lumbar Machine Exercise Dual cable cross: 3 lbs for hip flexion, hip extension, hip abduction x 10 reps bilaterally with v/c to keep knee straight      Knee/Hip Exercises: Aerobic   Nustep level 2 x 10 min with LEs only      Knee/Hip Exercises: Standing   Wall Squat 10 reps   with ball behind back   Other Standing Knee Exercises step ups on 1 riser x  10 reps bilaterally then lateral step ups x 10 reps bilaterally with increased difficulty when leading with the LLE      Knee/Hip Exercises: Seated   Long Arc Quad Strengthening;Both;10 reps;Weights;2 sets   with 5 sec holds   Long Arc Quad Weight 3 lbs.                          PT Long Term Goals - 03/07/21 1408       PT LONG TERM GOAL #1   Title Pt will demonstrate 165 degrees of bilateral shoulder flexion to allow her to reach overhead.    Baseline R 144, L 142; 02/06/21- R 175, L 173    Time 4    Period Weeks    Status Achieved      PT LONG TERM GOAL #2   Title Pt will demonstrate 165 degrees of bilateral shoulder abduction to allow her to reach out to the side.    Baseline R 126, L 110; 02/06/21- R 176, L 175    Time 4    Period Weeks    Status Achieved      PT LONG TERM GOAL #3   Title Pt will be independent in a home exercise program for continued strengthening and stretching.    Time 4    Period Weeks    Status On-going      PT LONG TERM GOAL #4   Title Pt will demonstrate 5/5 bilateral ankle dorsiflexion strength to decrease risk of falls    Baseline R 3+/5, L 4+/5; 03/07/21- 5/5    Time 4    Period Weeks    Status Achieved      PT LONG TERM GOAL #5   Title Pt will demonstrate 5/5 bilateral hip flexion strength to decrease risk of falls    Baseline R and L 3/5; 03/07/21- R 4/5, L 3+/5    Time 4    Period Weeks    Status On-going      PT LONG TERM GOAL #6   Title Pt will report decreased discomfort in bilateral trunk to palpation to allow improved comfort.    Baseline pt has discomfort and soreness in bilateral trunk in area of serratus; 03/07/21- that has completely improved    Time 4    Period Weeks    Status Achieved                   Plan - 03/14/21 1503     Clinical Impression Statement  Continued instructing pt in high level balance and LE strengthening exercises today. Pt demonstrated improved ability to tolerate exercises  today. She was able to do 10 reps of hip exercises on cable cross machine and demonstrated improved balance on the airex today. She is still challenged with eyes closed on unstable surface and demonstrates increased sway with this. Pt reports since she has been doing the gastroc stretch at home she has not been having calf cramps.    PT Frequency 2x / week    PT Duration 4 weeks    PT Treatment/Interventions ADLs/Self Care Home Management;Therapeutic activities;Therapeutic exercise;Balance training;Neuromuscular re-education;Patient/family education;Manual techniques;Manual lymph drainage;Scar mobilization;Passive range of motion;Taping    PT Next Visit Plan show pt toe seperator socks, ITB tightness? cont ankle and hip flexor strengthening, high level balance activities with eyes closed/open/uneven surfaces/ tandem walking etc, STM to bilateral serratus    PT Home Exercise Plan sit to stands, standing on 1 leg; Access Code: [email or web address],Access Code [email or web address]    Consulted and Agree with Plan of Care Patient             Patient will benefit from skilled therapeutic intervention in order to improve the following deficits and impairments:  Postural dysfunction, Impaired UE functional use, Impaired flexibility, Increased fascial restricitons, Decreased strength, Decreased activity tolerance, Decreased range of motion, Decreased scar mobility, Increased edema, Decreased balance, Decreased endurance, Decreased mobility, Difficulty walking, Increased muscle spasms  Visit Diagnosis: Difficulty in walking, not elsewhere classified  Muscle weakness (generalized)  Other muscle spasm  Stiffness of left shoulder, not elsewhere classified  Stiffness of right shoulder, not elsewhere classified  Aftercare following surgery for neoplasm  Abnormal posture     Problem List Patient Active Problem List   Diagnosis Date Noted   Nausea without vomiting 10/24/2020   Hypokalemia 08/23/2020   Dehydration  08/23/2020   Hypomagnesemia 08/10/2020   Local recurrence of cancer of left breast ([place]) 06/08/2020    Class: Diagnosis of   Osteoporosis 06/04/2020    Class: Chronic   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

## 2021-03-19 ENCOUNTER — Encounter: Payer: Self-pay | Admitting: Physical Therapy

## 2021-03-19 ENCOUNTER — Ambulatory Visit: Payer: Medicare Other | Attending: General Surgery | Admitting: Physical Therapy

## 2021-03-19 ENCOUNTER — Other Ambulatory Visit: Payer: Self-pay

## 2021-03-19 DIAGNOSIS — M25612 Stiffness of left shoulder, not elsewhere classified: Secondary | ICD-10-CM | POA: Diagnosis present

## 2021-03-19 DIAGNOSIS — Z483 Aftercare following surgery for neoplasm: Secondary | ICD-10-CM | POA: Diagnosis present

## 2021-03-19 DIAGNOSIS — M62838 Other muscle spasm: Secondary | ICD-10-CM | POA: Diagnosis present

## 2021-03-19 DIAGNOSIS — M25611 Stiffness of right shoulder, not elsewhere classified: Secondary | ICD-10-CM | POA: Diagnosis present

## 2021-03-19 DIAGNOSIS — R293 Abnormal posture: Secondary | ICD-10-CM | POA: Diagnosis present

## 2021-03-19 DIAGNOSIS — M6281 Muscle weakness (generalized): Secondary | ICD-10-CM | POA: Insufficient documentation

## 2021-03-19 DIAGNOSIS — R262 Difficulty in walking, not elsewhere classified: Secondary | ICD-10-CM | POA: Diagnosis present

## 2021-03-19 NOTE — Therapy (Addendum)
[facility] @ [place], [place], [address] Phone: [phone]   Fax:  [phone]  Physical Therapy Treatment  Patient Details  Name: Tammy Fowler MRN: [id number] Date of Birth: 1950/12/04 Referring Provider (PT): Dr. Donne Hazel   Encounter Date: 03/19/2021   PT End of Session - 03/19/21 1447     Visit Number 7    Number of Visits 18    Date for PT Re-Evaluation 04/04/21    PT Start Time 6712    PT Stop Time 1449    PT Time Calculation (min) 46 min    Activity Tolerance Patient tolerated treatment well    Behavior During Therapy [facility] for tasks assessed/performed             Past Medical History:  Diagnosis Date   Cancer ([place])    Depression    Diabetes mellitus without complication ([place])    Hyperlipidemia    Hypomagnesemia 08/10/2020   Sleep apnea     Past Surgical History:  Procedure Laterality Date   ABDOMINAL HYSTERECTOMY     BREAST RECONSTRUCTION WITH PLACEMENT OF TISSUE EXPANDER AND FLEX HD (ACELLULAR HYDRATED DERMIS) Bilateral 12/11/2020   Procedure: BILATERAL BREAST RECONSTRUCTION WITH PLACEMENT OF TISSUE EXPANDER AND FLEX HD (ACELLULAR HYDRATED DERMIS);  Surgeon: [name], MD;  Location: [facility];  Service: Plastics;  Laterality: Bilateral;   BREAST SURGERY Left    HYSTERECTOMY ABDOMINAL WITH SALPINGO-OOPHORECTOMY  07/2003   due to fibroids   NIPPLE SPARING MASTECTOMY Bilateral 12/11/2020   Procedure: BILATERAL NIPPLE SPARING MASTECTOMY;  Surgeon: [name], MD;  Location: [facility];  Service: General;  Laterality: Bilateral;   PORTACATH PLACEMENT Right 06/26/2020   Procedure: INSERTION PORT-A-CATH;  Surgeon: [name], MD;  Location: [facility];  Service: General;  Laterality: Right;   TRIGGER FINGER RELEASE      There were no vitals filed for this visit.   Subjective Assessment - 03/19/21 1403     Subjective I  was just a little bit sore after last time. Nothing major. Sometimes I think my knees get more aggrevated by the exercise.    Pertinent History bilateral mastectomy with expander placement in 12/11/20, pt had recurrence of previous breast cancer in Jan 2022, had drains removed 2 weeks ago, pt is currently undergoing chemo, 2004 L breast cancer stage IIIA with ALND (17 nodes removed some positive), completed chemo and radiation, had lat flap at that time, had lift on the R, total hysterectomy in 2005    Patient Stated Goals to improve ROM of bilateral shoulders and to get her knees stronger    Currently in Pain? Yes    Pain Score 3     Pain Location Knee    Pain Orientation Left;Right    Pain Descriptors / Indicators Aching    Pain Type Chronic pain    Pain Onset More than a month ago    Pain Frequency Constant    Aggravating Factors  walking    Pain Relieving Factors rest    Effect of Pain on Daily Activities limits abiity to walk                               [facility] Adult PT Treatment/Exercise - 03/19/21 0001       Neuro Re-ed    Neuro Re-ed Details  in // bars: standing on black foam pad with  eyes closed x 30 sec x 2 with feet shoulder width apart with some increased sway, standing eyes closed marching on foam with eyes open x 30 sec then eyes closed x 30 sec with pt having increased difficulty with this,  standing eyes closed on airex with feet close together x 30 sec x  with more increased sway, standing on foam doing 3 way hip x 10 reps in all directions with occasional losses of balance,  standing on 1 foot x 4 reps with pt able to maintain around 20 sec bilaterally, walking on airex beam x 4 reps, step ups x 10 reps with 1 riser bilaterally, lateral step ups x 10 reps with 1 riser      Lumbar Exercises: Machines for Strengthening   Other Lumbar Machine Exercise Dual cable cross: 7 lbs for hip flexion, hip extension, hip abduction x 10 reps bilaterally with v/c to keep  knee straight; squats x 10 reps with 7 lbs; rotations for core strength x 10 reps bilaterally with 7lbs      Knee/Hip Exercises: Aerobic   Nustep level 4 x 10 min with LEs only                          PT Long Term Goals - 03/07/21 1408       PT LONG TERM GOAL #1   Title Pt will demonstrate 165 degrees of bilateral shoulder flexion to allow her to reach overhead.    Baseline R 144, L 142; 02/06/21- R 175, L 173    Time 4    Period Weeks    Status Achieved      PT LONG TERM GOAL #2   Title Pt will demonstrate 165 degrees of bilateral shoulder abduction to allow her to reach out to the side.    Baseline R 126, L 110; 02/06/21- R 176, L 175    Time 4    Period Weeks    Status Achieved      PT LONG TERM GOAL #3   Title Pt will be independent in a home exercise program for continued strengthening and stretching.    Time 4    Period Weeks    Status On-going      PT LONG TERM GOAL #4   Title Pt will demonstrate 5/5 bilateral ankle dorsiflexion strength to decrease risk of falls    Baseline R 3+/5, L 4+/5; 03/07/21- 5/5    Time 4    Period Weeks    Status Achieved      PT LONG TERM GOAL #5   Title Pt will demonstrate 5/5 bilateral hip flexion strength to decrease risk of falls    Baseline R and L 3/5; 03/07/21- R 4/5, L 3+/5    Time 4    Period Weeks    Status On-going      PT LONG TERM GOAL #6   Title Pt will report decreased discomfort in bilateral trunk to palpation to allow improved comfort.    Baseline pt has discomfort and soreness in bilateral trunk in area of serratus; 03/07/21- that has completely improved    Time 4    Period Weeks    Status Achieved                   Plan - 03/19/21 1450     Clinical Impression Statement Pt was able to tolerate more resistance today with hip strengthening exercises. She fatigues quickly on the leg  she is standing on. Continued with high level balance exercises on foam with eyes open and closed. Pt has  increased difficulty with eyes closed but was able to maintain her balance longer today than last session so added standing marches with eyes closed and pt has slight loss of balance with this. She would benefit from continued LE strengthening and balance training.    PT Frequency 2x / week    PT Duration 4 weeks    PT Treatment/Interventions ADLs/Self Care Home Management;Therapeutic activities;Therapeutic exercise;Balance training;Neuromuscular re-education;Patient/family education;Manual techniques;Manual lymph drainage;Scar mobilization;Passive range of motion;Taping    PT Next Visit Plan assess progress towards goals, show pt toe seperator socks, ITB tightness? cont ankle and hip flexor strengthening, high level balance activities with eyes closed/open/uneven surfaces/ tandem walking etc, STM to bilateral serratus    PT Home Exercise Plan sit to stands, standing on 1 leg; Access Code: [email or web address],Access Code [email or web address]    Consulted and Agree with Plan of Care Patient             Patient will benefit from skilled therapeutic intervention in order to improve the following deficits and impairments:  Postural dysfunction, Impaired UE functional use, Impaired flexibility, Increased fascial restricitons, Decreased strength, Decreased activity tolerance, Decreased range of motion, Decreased scar mobility, Increased edema, Decreased balance, Decreased endurance, Decreased mobility, Difficulty walking, Increased muscle spasms  Visit Diagnosis: Difficulty in walking, not elsewhere classified  Muscle weakness (generalized)  Other muscle spasm  Stiffness of left shoulder, not elsewhere classified  Stiffness of right shoulder, not elsewhere classified  Aftercare following surgery for neoplasm  Abnormal posture     Problem List Patient Active Problem List   Diagnosis Date Noted   Nausea without vomiting 10/24/2020   Hypokalemia 08/23/2020   Dehydration 08/23/2020   Hypomagnesemia 08/10/2020    Local recurrence of cancer of left breast ([place]) 06/08/2020    Class: Diagnosis of   Osteoporosis 06/04/2020    Class: Chronic   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    Class: Chronic   Breast cancer in female [facility]) 11/03/2002    Class: History of    Allyson Sabal [place], PT 03/19/2021, 2:56 PM  [facility] @ [place], [place], [address] Phone: [phone]   Fax:  [phone]  Name: Tammy Fowler MRN: [id number] Date of Birth: 1951-05-15   Manus Gunning, PT 03/19/21 2:56 PM  PHYSICAL THERAPY DISCHARGE SUMMARY  Visits from Start of Care: 7  Current functional level related to goals / functional outcomes: See above   Remaining deficits: See above   Education / Equipment: HEP   Patient agrees to discharge. Patient goals were partially met. Patient is being discharged due to not returning since the last visit.  Allyson Sabal [place], [place] 09/02/21 11:53 AM

## 2021-03-21 ENCOUNTER — Encounter: Payer: Medicare Other | Admitting: Physical Therapy

## 2021-03-26 ENCOUNTER — Encounter: Payer: Self-pay | Admitting: Oncology

## 2021-04-08 ENCOUNTER — Encounter: Payer: Medicare Other | Admitting: Physical Therapy

## 2021-04-10 ENCOUNTER — Encounter: Payer: Self-pay | Admitting: Surgical

## 2021-04-10 ENCOUNTER — Ambulatory Visit (INDEPENDENT_AMBULATORY_CARE_PROVIDER_SITE_OTHER): Payer: Medicare Other | Admitting: Surgical

## 2021-04-10 ENCOUNTER — Other Ambulatory Visit: Payer: Self-pay

## 2021-04-10 VITALS — BP 131/74 | HR 92 | Ht 62.0 in | Wt 143.6 lb

## 2021-04-10 DIAGNOSIS — C50912 Malignant neoplasm of unspecified site of left female breast: Secondary | ICD-10-CM

## 2021-04-10 DIAGNOSIS — Z170 Estrogen receptor positive status [ER+]: Secondary | ICD-10-CM

## 2021-04-10 MED ORDER — HYDROCODONE-ACETAMINOPHEN 5-325 MG PO TABS: 1.0000 | ORAL_TABLET | Freq: Four times a day (QID) | ORAL | 0 refills | Status: AC | PRN

## 2021-04-10 MED ORDER — ONDANSETRON HCL 4 MG PO TABS: 4.0000 mg | ORAL_TABLET | Freq: Three times a day (TID) | ORAL | 0 refills | Status: DC | PRN

## 2021-04-10 NOTE — Progress Notes (Signed)
Patient ID: Tammy Fowler, female    DOB: 03-07-1951, 70 y.o.   MRN: [id number]  Chief Complaint  Patient presents with   Pre-op Exam      ICD-10-CM   1. Malignant neoplasm of left breast in female, estrogen receptor positive, unspecified site of breast [facility])  C50.912    Z17.0       History of Present Illness: Tammy Fowler is a 70 y.o.  female  with a history of bilateral nipple sparing mastectomies with subsequent bilateral breast reconstruction and placement of tissue expanders in July 2022.  She presents for preoperative evaluation for upcoming procedure, exchange of bilateral breast tissue expanders for bilateral breast implants, scheduled for 04/30/2021 with Dr. Claudia Desanctis.  The patient has not had problems with anesthesia. No history of DVT/PE.  No family history of DVT/PE.  No family or personal history of bleeding or clotting disorders.  Patient is not currently taking any blood thinners.  No history of CVA/MI.   Summary of Previous Visit: Currently has 320 cc in the right breast and 310 cc in the left breast.  She has a history of radiation to the left breast Of note she does have a history of a latissimus flap on the left side with a small skin paddle.  PMH Significant for: Diabetes mellitus, type II, most recent A1c 6.5, OSA -reports she no longer uses CPAP machine, reports that this provided her with a mouthguard which significantly helps.  She does not report any OSA symptoms at this time. She is currently on ASA 81 mg daily, she takes this for preventative reasons.  It is not prescribed.  She takes this because her brother had a history of heart disease. She does have a Port-A-Cath in place. No recent changes in her health.  She is currently on dual HER2 treatment with trastuzumab and pertuzumab.  She reports last treatment was approximately 6 weeks ago and she is going to wait a few weeks postoperatively to schedule her next dose.  She reports approximately 6 weeks  after surgery.  She has discussed this with oncology.  She is interested in shortening bilateral nipples, she would like to know if this is possible.  She would also like gummy bear textured silicone implants.  No recent fevers, chills, nausea, vomiting, chest pain or shortness of breath.   Past Medical History: Allergies: Allergies  Allergen Reactions   Oxycodone Itching   Codeine Other (See Comments)    Other reaction(s): Other (See Comments) Unknown Unknown    Lisinopril     Other reaction(s): Cough (ALLERGY/intolerance)    Current Medications:  Current Outpatient Medications:    ACCU-CHEK GUIDE test strip, daily. for testing as directed, Disp: , Rfl:    aspirin 81 MG chewable tablet, Chew by mouth., Disp: , Rfl:    benzonatate (TESSALON) 100 MG capsule, , Disp: , Rfl:    calcium-vitamin D 250-100 MG-UNIT tablet, Take by mouth., Disp: , Rfl:    diphenoxylate-atropine (LOMOTIL) 2.5-0.025 MG tablet, Take 2 tablets by mouth 4 (four) times daily as needed for diarrhea or loose stools., Disp: 60 tablet, Rfl: 3   Docusate Sodium (DSS) 100 MG CAPS, Take by mouth., Disp: , Rfl:    DULoxetine (CYMBALTA) 60 MG capsule, Take 1 capsule by mouth daily., Disp: , Rfl:    FLUoxetine (PROZAC) 40 MG capsule, Take by mouth., Disp: , Rfl:    gabapentin (NEURONTIN) 300 MG capsule, TAKE 1 CAPSULE BY MOUTH THREE TIMES A DAY,  Disp: 90 capsule, Rfl: 0   HYDROcodone-acetaminophen (NORCO) 7.5-325 MG tablet, TAKE 1 TABLET BY MOUTH EVERY 4 HOURS AS NEEDED FOR MODERATE PAIN, Disp: 120 tablet, Rfl: 0   HYDROmorphone (DILAUDID) 2 MG tablet, Take 0.5 tablets (1 mg total) by mouth every 4 (four) hours as needed for severe pain., Disp: 25 tablet, Rfl: 0   LORazepam (ATIVAN) 0.5 MG tablet, Take 1 tablet (0.5 mg total) by mouth every 6 (six) hours as needed for anxiety., Disp: 90 tablet, Rfl: 0   losartan (COZAAR) 50 MG tablet, daily., Disp: , Rfl:    magnesium oxide (MAG-OX) 400 (241.3 Mg) MG tablet, Take 1  tablet (400 mg total) by mouth 2 (two) times daily., Disp: 60 tablet, Rfl: 1   meclizine (ANTIVERT) 25 MG tablet, Take 1 tablet (25 mg total) by mouth 3 (three) times daily as needed for dizziness., Disp: 30 tablet, Rfl: 0   meloxicam (MOBIC) 15 MG tablet, Take 1 tablet (15 mg total) by mouth daily., Disp: 30 tablet, Rfl: 5   metFORMIN (GLUCOPHAGE) 500 MG tablet, 2 (two) times daily., Disp: , Rfl:    Multiple Vitamins-Minerals (THERA-M) TABS, Take by mouth., Disp: , Rfl:    prochlorperazine (COMPAZINE) 10 MG tablet, Take 1 tablet (10 mg total) by mouth every 6 (six) hours as needed (Nausea or vomiting)., Disp: 30 tablet, Rfl: 1   rosuvastatin (CRESTOR) 40 MG tablet, daily., Disp: , Rfl:    traMADol (ULTRAM) 50 MG tablet, Take 1-2 tablets (50-100 mg total) by mouth every 6 (six) hours as needed., Disp: 60 tablet, Rfl: 0   vitamin B-12 (CYANOCOBALAMIN) 500 MCG tablet, Take 500 mcg by mouth daily., Disp: , Rfl:  No current facility-administered medications for this visit.  Facility-Administered Medications Ordered in Other Visits:    acetaminophen (TYLENOL) tablet 650 mg, 650 mg, Oral, Once, [name], MD   diphenhydrAMINE (BENADRYL) capsule 25 mg, 25 mg, Oral, Once, [name], MD   sodium chloride flush (NS) 0.9 % injection 10 mL, 10 mL, Intracatheter, PRN, [name], MD, 10 mL at 01/14/21 1624  Past Medical Problems: Past Medical History:  Diagnosis Date   Cancer ([place])    Depression    Diabetes mellitus without complication ([place])    Hyperlipidemia    Hypomagnesemia 08/10/2020   Sleep apnea     Past Surgical History: Past Surgical History:  Procedure Laterality Date   ABDOMINAL HYSTERECTOMY     BREAST RECONSTRUCTION WITH PLACEMENT OF TISSUE EXPANDER AND FLEX HD (ACELLULAR HYDRATED DERMIS) Bilateral 12/11/2020   Procedure: BILATERAL BREAST RECONSTRUCTION WITH PLACEMENT OF TISSUE EXPANDER AND FLEX HD (ACELLULAR HYDRATED DERMIS);  Surgeon: [name],  MD;  Location: [facility];  Service: Plastics;  Laterality: Bilateral;   BREAST SURGERY Left    HYSTERECTOMY ABDOMINAL WITH SALPINGO-OOPHORECTOMY  07/2003   due to fibroids   NIPPLE SPARING MASTECTOMY Bilateral 12/11/2020   Procedure: BILATERAL NIPPLE SPARING MASTECTOMY;  Surgeon: [name], MD;  Location: [place];  Service: General;  Laterality: Bilateral;   PORTACATH PLACEMENT Right 06/26/2020   Procedure: INSERTION PORT-A-CATH;  Surgeon: [name], MD;  Location: [place];  Service: General;  Laterality: Right;   TRIGGER FINGER RELEASE      Social History: Social History   Socioeconomic History   Marital status: Married    Spouse name: Not on file   Number of children: 2   Years of education: Not on file   Highest education level: Not on  file  Occupational History   Not on file  Tobacco Use   Smoking status: Never   Smokeless tobacco: Never  Substance and Sexual Activity   Alcohol use: Yes    Comment: wine   Drug use: Not on file   Sexual activity: Not on file  Other Topics Concern   Not on file  Social History Narrative   Not on file   Social Determinants of Health   Financial Resource Strain: Not on file  Food Insecurity: Not on file  Transportation Needs: Not on file  Physical Activity: Not on file  Stress: Not on file  Social Connections: Not on file  Intimate Partner Violence: Not on file    Family History: Family History  Problem Relation Age of Onset   Breast cancer Mother 70   Breast cancer Maternal Aunt 25   Ovarian cancer Maternal Grandmother 40   Bone cancer Maternal Grandfather        50s   Cancer Maternal Aunt 55    Review of Systems: Review of Systems  Constitutional: Negative.   Respiratory: Negative.    Cardiovascular: Negative.   Gastrointestinal: Negative.   Neurological: Negative.    Physical Exam: Vital Signs BP 131/74 (BP Location: Right Arm, Patient Position:  Sitting, Cuff Size: Normal)   Pulse 92   Ht 5' 2"  (1.575 m)   Wt 143 lb 9.6 oz (65.1 kg)   SpO2 99%   BMI 26.26 kg/m   Physical Exam  Constitutional:      General: Not in acute distress.    Appearance: Normal appearance. Not ill-appearing.  HENT:     Head: Normocephalic and atraumatic.  Eyes:     Pupils: Pupils are equal, round Neck:     Musculoskeletal: Normal range of motion.  Cardiovascular:     Rate and Rhythm: Normal rate    Pulses: Normal pulses.  Pulmonary:     Effort: Pulmonary effort is normal. No respiratory distress.  Abdominal:     General: Abdomen is flat. There is no distension.  Musculoskeletal: Normal range of motion.  Skin:    General: Skin is warm and dry.     Findings: No erythema or rash.  Neurological:     General: No focal deficit present.     Mental Status: Alert and oriented to person, place, and time. Mental status is at baseline.     Motor: No weakness.  Psychiatric:        Mood and Affect: Mood normal.        Behavior: Behavior normal.    Assessment/Plan: The patient is scheduled for exchange of bilateral breast tissue expanders for bilateral breast implants with Dr. Claudia Desanctis.  Risks, benefits, and alternatives of procedure discussed, questions answered and consent obtained.    Smoking Status: Non-smoker; Counseling Given?  N/A  Caprini Score: 9, highest; Risk Factors include: Age, BMI greater than 25, history of cancer, Port-A-Cath in place and length of planned surgery. Recommendation for mechanical prophylaxis. Encourage early ambulation.  Will discuss possible need for postop Lovenox with Dr. Claudia Desanctis.  Pictures obtained: 02/06/2021.  Post-op Rx sent to pharmacy: Norco, Zofran  Patient was provided with the General Surgical Risk consent document and Pain Medication Agreement prior to their appointment.  They had adequate time to read through the risk consent documents and Pain Medication Agreement. We also discussed them in person together during  this preop appointment. All of their questions were answered to their satisfaction.  Recommended calling if they have any further  questions.  Risk consent form and Pain Medication Agreement to be scanned into patient's chart.  Patient was provided with the Mentor implant patient decision checklist and this was completed during today's preoperative evaluation. Patient had time to read through the information and any questions were answered to their content. Form will be scanned into patient's chart.  The risks that can be encountered with and after placement of a breast implant were discussed and include the following but not limited to these: bleeding, infection, delayed healing, anesthesia risks, skin sensation changes, injury to structures including nerves, blood vessels, and muscles which may be temporary or permanent, allergies to tape, suture materials and glues, blood products, topical preparations or injected agents, skin contour irregularities, skin discoloration and swelling, deep vein thrombosis, cardiac and pulmonary complications, pain, which may persist, fluid accumulation, wrinkling of the skin over the implanmt, changes in nipple or breast sensation, implant leakage or rupture, faulty position of the implant, persistent pain, formation of tight scar tissue around the implant (capsular contracture).  Discussed with patient that I would discuss possibility of shortening nipple of bilateral NAC's with Dr. Claudia Desanctis.  I also specifically discussed with patient that she is at an increased risk of postoperative complications with her history of diabetes mellitus and her history of radiation to the left breast.  Patient was understanding of this.   Electronically signed by: Carola Rhine Hyden Soley, PA-C 04/10/2021 2:26 PM

## 2021-04-10 NOTE — H&P (View-Only) (Signed)
Patient ID: Tammy Fowler, female    DOB: [birth year], 70 y.o.   MRN: [id number]  Chief Complaint  Patient presents with   Pre-op Exam      ICD-10-CM   1. Malignant neoplasm of left breast in female, estrogen receptor positive, unspecified site of breast [facility])  C50.912    Z17.0       History of Present Illness: Tammy Fowler is a 70 y.o.  female  with a history of bilateral nipple sparing mastectomies with subsequent bilateral breast reconstruction and placement of tissue expanders in July 2022.  She presents for preoperative evaluation for upcoming procedure, exchange of bilateral breast tissue expanders for bilateral breast implants, scheduled for 04/30/2021 with Dr. Claudia Desanctis.  The patient has not had problems with anesthesia. No history of DVT/PE.  No family history of DVT/PE.  No family or personal history of bleeding or clotting disorders.  Patient is not currently taking any blood thinners.  No history of CVA/MI.   Summary of Previous Visit: Currently has 320 cc in the right breast and 310 cc in the left breast.  She has a history of radiation to the left breast Of note she does have a history of a latissimus flap on the left side with a small skin paddle.  PMH Significant for: Diabetes mellitus, type II, most recent A1c 6.5, OSA -reports she no longer uses CPAP machine, reports that this provided her with a mouthguard which significantly helps.  She does not report any OSA symptoms at this time. She is currently on ASA 81 mg daily, she takes this for preventative reasons.  It is not prescribed.  She takes this because her brother had a history of heart disease. She does have a Port-A-Cath in place. No recent changes in her health.  She is currently on dual HER2 treatment with trastuzumab and pertuzumab.  She reports last treatment was approximately 6 weeks ago and she is going to wait a few weeks postoperatively to schedule her next dose.  She reports approximately 6 weeks  after surgery.  She has discussed this with oncology.  She is interested in shortening bilateral nipples, she would like to know if this is possible.  She would also like gummy bear textured silicone implants.  No recent fevers, chills, nausea, vomiting, chest pain or shortness of breath.   Past Medical History: Allergies: Allergies  Allergen Reactions   Oxycodone Itching   Codeine Other (See Comments)    Other reaction(s): Other (See Comments) Unknown Unknown    Lisinopril     Other reaction(s): Cough (ALLERGY/intolerance)    Current Medications:  Current Outpatient Medications:    ACCU-CHEK GUIDE test strip, daily. for testing as directed, Disp: , Rfl:    aspirin 81 MG chewable tablet, Chew by mouth., Disp: , Rfl:    benzonatate (TESSALON) 100 MG capsule, , Disp: , Rfl:    calcium-vitamin D 250-100 MG-UNIT tablet, Take by mouth., Disp: , Rfl:    diphenoxylate-atropine (LOMOTIL) 2.5-0.025 MG tablet, Take 2 tablets by mouth 4 (four) times daily as needed for diarrhea or loose stools., Disp: 60 tablet, Rfl: 3   Docusate Sodium (DSS) 100 MG CAPS, Take by mouth., Disp: , Rfl:    DULoxetine (CYMBALTA) 60 MG capsule, Take 1 capsule by mouth daily., Disp: , Rfl:    FLUoxetine (PROZAC) 40 MG capsule, Take by mouth., Disp: , Rfl:    gabapentin (NEURONTIN) 300 MG capsule, TAKE 1 CAPSULE BY MOUTH THREE TIMES A DAY,  Disp: 90 capsule, Rfl: 0   HYDROcodone-acetaminophen (NORCO) 7.5-325 MG tablet, TAKE 1 TABLET BY MOUTH EVERY 4 HOURS AS NEEDED FOR MODERATE PAIN, Disp: 120 tablet, Rfl: 0   HYDROmorphone (DILAUDID) 2 MG tablet, Take 0.5 tablets (1 mg total) by mouth every 4 (four) hours as needed for severe pain., Disp: 25 tablet, Rfl: 0   LORazepam (ATIVAN) 0.5 MG tablet, Take 1 tablet (0.5 mg total) by mouth every 6 (six) hours as needed for anxiety., Disp: 90 tablet, Rfl: 0   losartan (COZAAR) 50 MG tablet, daily., Disp: , Rfl:    magnesium oxide (MAG-OX) 400 (241.3 Mg) MG tablet, Take 1  tablet (400 mg total) by mouth 2 (two) times daily., Disp: 60 tablet, Rfl: 1   meclizine (ANTIVERT) 25 MG tablet, Take 1 tablet (25 mg total) by mouth 3 (three) times daily as needed for dizziness., Disp: 30 tablet, Rfl: 0   meloxicam (MOBIC) 15 MG tablet, Take 1 tablet (15 mg total) by mouth daily., Disp: 30 tablet, Rfl: 5   metFORMIN (GLUCOPHAGE) 500 MG tablet, 2 (two) times daily., Disp: , Rfl:    Multiple Vitamins-Minerals (THERA-M) TABS, Take by mouth., Disp: , Rfl:    prochlorperazine (COMPAZINE) 10 MG tablet, Take 1 tablet (10 mg total) by mouth every 6 (six) hours as needed (Nausea or vomiting)., Disp: 30 tablet, Rfl: 1   rosuvastatin (CRESTOR) 40 MG tablet, daily., Disp: , Rfl:    traMADol (ULTRAM) 50 MG tablet, Take 1-2 tablets (50-100 mg total) by mouth every 6 (six) hours as needed., Disp: 60 tablet, Rfl: 0   vitamin B-12 (CYANOCOBALAMIN) 500 MCG tablet, Take 500 mcg by mouth daily., Disp: , Rfl:  No current facility-administered medications for this visit.  Facility-Administered Medications Ordered in Other Visits:    acetaminophen (TYLENOL) tablet 650 mg, 650 mg, Oral, Once, [name], MD   diphenhydrAMINE (BENADRYL) capsule 25 mg, 25 mg, Oral, Once, [name], MD   sodium chloride flush (NS) 0.9 % injection 10 mL, 10 mL, Intracatheter, PRN, [name], MD, 10 mL at 01/14/21 1624  Past Medical Problems: Past Medical History:  Diagnosis Date   Cancer ([place])    Depression    Diabetes mellitus without complication ([place])    Hyperlipidemia    Hypomagnesemia 08/10/2020   Sleep apnea     Past Surgical History: Past Surgical History:  Procedure Laterality Date   ABDOMINAL HYSTERECTOMY     BREAST RECONSTRUCTION WITH PLACEMENT OF TISSUE EXPANDER AND FLEX HD (ACELLULAR HYDRATED DERMIS) Bilateral 12/11/2020   Procedure: BILATERAL BREAST RECONSTRUCTION WITH PLACEMENT OF TISSUE EXPANDER AND FLEX HD (ACELLULAR HYDRATED DERMIS);  Surgeon: [name],  MD;  Location: [facility];  Service: Plastics;  Laterality: Bilateral;   BREAST SURGERY Left    HYSTERECTOMY ABDOMINAL WITH SALPINGO-OOPHORECTOMY  07/2003   due to fibroids   NIPPLE SPARING MASTECTOMY Bilateral 12/11/2020   Procedure: BILATERAL NIPPLE SPARING MASTECTOMY;  Surgeon: [name], MD;  Location: [facility];  Service: General;  Laterality: Bilateral;   PORTACATH PLACEMENT Right 06/26/2020   Procedure: INSERTION PORT-A-CATH;  Surgeon: [name], MD;  Location: [facility];  Service: General;  Laterality: Right;   TRIGGER FINGER RELEASE      Social History: Social History   Socioeconomic History   Marital status: Married    Spouse name: Not on file   Number of children: 2   Years of education: Not on file   Highest education level: Not on  file  Occupational History   Not on file  Tobacco Use   Smoking status: Never   Smokeless tobacco: Never  Substance and Sexual Activity   Alcohol use: Yes    Comment: wine   Drug use: Not on file   Sexual activity: Not on file  Other Topics Concern   Not on file  Social History Narrative   Not on file   Social Determinants of Health   Financial Resource Strain: Not on file  Food Insecurity: Not on file  Transportation Needs: Not on file  Physical Activity: Not on file  Stress: Not on file  Social Connections: Not on file  Intimate Partner Violence: Not on file    Family History: Family History  Problem Relation Age of Onset   Breast cancer Mother 40   Breast cancer Maternal Aunt 25   Ovarian cancer Maternal Grandmother 64   Bone cancer Maternal Grandfather        70s   Cancer Maternal Aunt 55    Review of Systems: Review of Systems  Constitutional: Negative.   Respiratory: Negative.    Cardiovascular: Negative.   Gastrointestinal: Negative.   Neurological: Negative.    Physical Exam: Vital Signs BP 131/74 (BP Location: Right Arm, Patient Position:  Sitting, Cuff Size: Normal)    Pulse 92    Ht 5' 2"  (1.575 m)    Wt 143 lb 9.6 oz (65.1 kg)    SpO2 99%    BMI 26.26 kg/m   Physical Exam  Constitutional:      General: Not in acute distress.    Appearance: Normal appearance. Not ill-appearing.  HENT:     Head: Normocephalic and atraumatic.  Eyes:     Pupils: Pupils are equal, round Neck:     Musculoskeletal: Normal range of motion.  Cardiovascular:     Rate and Rhythm: Normal rate    Pulses: Normal pulses.  Pulmonary:     Effort: Pulmonary effort is normal. No respiratory distress.  Abdominal:     General: Abdomen is flat. There is no distension.  Musculoskeletal: Normal range of motion.  Skin:    General: Skin is warm and dry.     Findings: No erythema or rash.  Neurological:     General: No focal deficit present.     Mental Status: Alert and oriented to person, place, and time. Mental status is at baseline.     Motor: No weakness.  Psychiatric:        Mood and Affect: Mood normal.        Behavior: Behavior normal.    Assessment/Plan: The patient is scheduled for exchange of bilateral breast tissue expanders for bilateral breast implants with Dr. Claudia Desanctis.  Risks, benefits, and alternatives of procedure discussed, questions answered and consent obtained.    Smoking Status: Non-smoker; Counseling Given?  N/A  Caprini Score: 9, highest; Risk Factors include: Age, BMI greater than 25, history of cancer, Port-A-Cath in place and length of planned surgery. Recommendation for mechanical prophylaxis. Encourage early ambulation.  Will discuss possible need for postop Lovenox with Dr. Claudia Desanctis.  Pictures obtained: 02/06/2021.  Post-op Rx sent to pharmacy: Norco, Zofran  Patient was provided with the General Surgical Risk consent document and Pain Medication Agreement prior to their appointment.  They had adequate time to read through the risk consent documents and Pain Medication Agreement. We also discussed them in person together during  this preop appointment. All of their questions were answered to their satisfaction.  Recommended calling  if they have any further questions.  Risk consent form and Pain Medication Agreement to be scanned into patient's chart.  Patient was provided with the Mentor implant patient decision checklist and this was completed during today's preoperative evaluation. Patient had time to read through the information and any questions were answered to their content. Form will be scanned into patient's chart.  The risks that can be encountered with and after placement of a breast implant were discussed and include the following but not limited to these: bleeding, infection, delayed healing, anesthesia risks, skin sensation changes, injury to structures including nerves, blood vessels, and muscles which may be temporary or permanent, allergies to tape, suture materials and glues, blood products, topical preparations or injected agents, skin contour irregularities, skin discoloration and swelling, deep vein thrombosis, cardiac and pulmonary complications, pain, which may persist, fluid accumulation, wrinkling of the skin over the implanmt, changes in nipple or breast sensation, implant leakage or rupture, faulty position of the implant, persistent pain, formation of tight scar tissue around the implant (capsular contracture).  Discussed with patient that I would discuss possibility of shortening nipple of bilateral NAC's with Dr. Claudia Desanctis.  I also specifically discussed with patient that she is at an increased risk of postoperative complications with her history of diabetes mellitus and her history of radiation to the left breast.  Patient was understanding of this.   Electronically signed by: Carola Rhine Yussuf Sawyers, PA-C 04/10/2021 2:26 PM

## 2021-04-17 ENCOUNTER — Encounter: Payer: Medicare Other | Admitting: Physical Therapy

## 2021-04-22 ENCOUNTER — Other Ambulatory Visit: Payer: Self-pay

## 2021-04-22 ENCOUNTER — Encounter (HOSPITAL_BASED_OUTPATIENT_CLINIC_OR_DEPARTMENT_OTHER): Payer: Self-pay | Admitting: Plastic Surgery

## 2021-04-22 ENCOUNTER — Encounter: Payer: Medicare Other | Admitting: Physical Therapy

## 2021-04-25 ENCOUNTER — Encounter (HOSPITAL_BASED_OUTPATIENT_CLINIC_OR_DEPARTMENT_OTHER)
Admission: RE | Admit: 2021-04-25 | Discharge: 2021-04-25 | Disposition: A | Discharge status: Home / Self Care | Payer: Medicare Other | Source: Ambulatory Visit | Attending: Plastic Surgery | Admitting: Plastic Surgery

## 2021-04-25 DIAGNOSIS — E119 Type 2 diabetes mellitus without complications: Secondary | ICD-10-CM | POA: Insufficient documentation

## 2021-04-25 DIAGNOSIS — Z01812 Encounter for preprocedural laboratory examination: Secondary | ICD-10-CM | POA: Diagnosis not present

## 2021-04-25 LAB — BASIC METABOLIC PANEL
Anion gap: 9 (ref 5–15)
BUN: 21 mg/dL (ref 8–23)
CO2: 29 mmol/L (ref 22–32)
Calcium: 10.2 mg/dL (ref 8.9–10.3)
Chloride: 101 mmol/L (ref 98–111)
Creatinine, Ser: 0.69 mg/dL (ref 0.44–1.00)
GFR, Estimated: >60 mL/min (ref >60)
Glucose, Bld: 199 mg/dL — ABNORMAL HIGH (ref 70–99)
Potassium: 5.6 mmol/L — ABNORMAL HIGH (ref 3.5–5.1)
Sodium: 139 mmol/L (ref 135–145)

## 2021-04-25 NOTE — Progress Notes (Signed)
Surgical soap given with instructions, pt verbalized understanding.  

## 2021-04-26 NOTE — Progress Notes (Signed)
K+5.6, reviewed with Dr. Christella Hartigan, will proceed with surgery as scheduled.

## 2021-04-29 NOTE — Anesthesia Preprocedure Evaluation (Addendum)
Anesthesia Evaluation  Patient identified by MRN, date of birth, ID band Patient awake    Reviewed: Allergy & Precautions, NPO status , Patient's Chart, lab work & pertinent test results  History of Anesthesia Complications Negative for: history of anesthetic complications  Airway Mallampati: II  TM Distance: >3 FB Neck ROM: Full    Dental no notable dental hx.    Pulmonary sleep apnea ,    Pulmonary exam normal        Cardiovascular negative cardio ROS Normal cardiovascular exam     Neuro/Psych Depression negative neurological ROS     GI/Hepatic negative GI ROS, Neg liver ROS,   Endo/Other  diabetes, Type 2, Oral Hypoglycemic Agents  Renal/GU negative Renal ROS  negative genitourinary   Musculoskeletal negative musculoskeletal ROS (+)   Abdominal   Peds  Hematology negative hematology ROS (+)   Anesthesia Other Findings Day of surgery medications reviewed with patient.  Reproductive/Obstetrics negative OB ROS                            Anesthesia Physical Anesthesia Plan  ASA: 2  Anesthesia Plan: General   Post-op Pain Management: Tylenol PO (pre-op)   Induction: Intravenous  PONV Risk Score and Plan: 3 and Treatment may vary due to age or medical condition, Ondansetron and Dexamethasone  Airway Management Planned: Oral ETT  Additional Equipment: None  Intra-op Plan:   Post-operative Plan: Extubation in OR  Informed Consent: I have reviewed the patients History and Physical, chart, labs and discussed the procedure including the risks, benefits and alternatives for the proposed anesthesia with the patient or authorized representative who has indicated his/her understanding and acceptance.     Dental advisory given  Plan Discussed with: CRNA  Anesthesia Plan Comments:        Anesthesia Quick Evaluation

## 2021-04-30 ENCOUNTER — Ambulatory Visit (HOSPITAL_BASED_OUTPATIENT_CLINIC_OR_DEPARTMENT_OTHER): Payer: Medicare Other | Admitting: Anesthesiology

## 2021-04-30 ENCOUNTER — Encounter (HOSPITAL_BASED_OUTPATIENT_CLINIC_OR_DEPARTMENT_OTHER)
Admission: RE | Disposition: A | Discharge status: Home / Self Care | Payer: Self-pay | Source: Ambulatory Visit | Attending: Plastic Surgery

## 2021-04-30 ENCOUNTER — Other Ambulatory Visit: Payer: Self-pay

## 2021-04-30 ENCOUNTER — Ambulatory Visit (HOSPITAL_BASED_OUTPATIENT_CLINIC_OR_DEPARTMENT_OTHER)
Admission: RE | Admit: 2021-04-30 | Discharge: 2021-04-30 | Disposition: A | Discharge status: Home / Self Care | Payer: Medicare Other | Source: Ambulatory Visit | Attending: Plastic Surgery | Admitting: Plastic Surgery

## 2021-04-30 ENCOUNTER — Encounter (HOSPITAL_BASED_OUTPATIENT_CLINIC_OR_DEPARTMENT_OTHER): Payer: Self-pay | Admitting: Plastic Surgery

## 2021-04-30 ENCOUNTER — Other Ambulatory Visit: Payer: Self-pay | Admitting: Surgical

## 2021-04-30 ENCOUNTER — Telehealth: Payer: Self-pay

## 2021-04-30 DIAGNOSIS — Z923 Personal history of irradiation: Secondary | ICD-10-CM | POA: Insufficient documentation

## 2021-04-30 DIAGNOSIS — Z853 Personal history of malignant neoplasm of breast: Secondary | ICD-10-CM | POA: Insufficient documentation

## 2021-04-30 DIAGNOSIS — Z170 Estrogen receptor positive status [ER+]: Secondary | ICD-10-CM

## 2021-04-30 DIAGNOSIS — C50912 Malignant neoplasm of unspecified site of left female breast: Secondary | ICD-10-CM

## 2021-04-30 DIAGNOSIS — Z7984 Long term (current) use of oral hypoglycemic drugs: Secondary | ICD-10-CM | POA: Insufficient documentation

## 2021-04-30 DIAGNOSIS — E119 Type 2 diabetes mellitus without complications: Secondary | ICD-10-CM | POA: Diagnosis not present

## 2021-04-30 DIAGNOSIS — Z421 Encounter for breast reconstruction following mastectomy: Secondary | ICD-10-CM | POA: Diagnosis present

## 2021-04-30 HISTORY — PX: REMOVAL OF BILATERAL TISSUE EXPANDERS WITH PLACEMENT OF BILATERAL BREAST IMPLANTS: SHX6431

## 2021-04-30 LAB — GLUCOSE, CAPILLARY
Glucose-Capillary: 129 mg/dL — ABNORMAL HIGH (ref 70–99)
Glucose-Capillary: 158 mg/dL — ABNORMAL HIGH (ref 70–99)
Glucose-Capillary: 150 mg/dL — ABNORMAL HIGH (ref 70–99)

## 2021-04-30 SURGERY — REMOVAL, TISSUE EXPANDER, BREAST, BILATERAL, WITH BILATERAL IMPLANT IMPLANT INSERTION
Anesthesia: General | Site: Breast | Laterality: Bilateral

## 2021-04-30 MED ORDER — FENTANYL CITRATE (PF) 100 MCG/2ML IJ SOLN
INTRAMUSCULAR | Status: AC
  Filled 2021-04-30: qty 2

## 2021-04-30 MED ORDER — CEFAZOLIN SODIUM-DEXTROSE 2-4 GM/100ML-% IV SOLN
INTRAVENOUS | Status: AC
  Filled 2021-04-30: qty 100

## 2021-04-30 MED ORDER — OXYCODONE HCL 5 MG PO TABS
ORAL_TABLET | ORAL | Status: AC
  Filled 2021-04-30: qty 1

## 2021-04-30 MED ORDER — CEFAZOLIN SODIUM-DEXTROSE 2-4 GM/100ML-% IV SOLN
2.0000 g | INTRAVENOUS | Status: AC
  Administered 2021-04-30: 2 g via INTRAVENOUS

## 2021-04-30 MED ORDER — FENTANYL CITRATE (PF) 100 MCG/2ML IJ SOLN
INTRAMUSCULAR | Status: DC | PRN
  Administered 2021-04-30 (×3): 50 ug via INTRAVENOUS

## 2021-04-30 MED ORDER — OXYCODONE HCL 5 MG PO TABS
5.0000 mg | ORAL_TABLET | ORAL | Status: DC | PRN
  Administered 2021-04-30: 5 mg via ORAL

## 2021-04-30 MED ORDER — CHLORHEXIDINE GLUCONATE CLOTH 2 % EX PADS: 6.0000 | MEDICATED_PAD | Freq: Once | CUTANEOUS | Status: DC

## 2021-04-30 MED ORDER — ROCURONIUM BROMIDE 100 MG/10ML IV SOLN
INTRAVENOUS | Status: DC | PRN
Start: 2021-04-30 — End: 2021-04-30
  Administered 2021-04-30: 40 mg via INTRAVENOUS

## 2021-04-30 MED ORDER — DEXAMETHASONE SODIUM PHOSPHATE 4 MG/ML IJ SOLN
INTRAMUSCULAR | Status: DC | PRN
Start: 2021-04-30 — End: 2021-04-30
  Administered 2021-04-30: 10 mg via INTRAVENOUS

## 2021-04-30 MED ORDER — ONDANSETRON HCL 4 MG/2ML IJ SOLN
INTRAMUSCULAR | Status: DC | PRN
  Administered 2021-04-30: 4 mg via INTRAVENOUS

## 2021-04-30 MED ORDER — SODIUM CHLORIDE 0.9 % IV SOLN
INTRAVENOUS | Status: AC
  Filled 2021-04-30 (×2): qty 10

## 2021-04-30 MED ORDER — BUPIVACAINE-EPINEPHRINE 0.25% -1:200000 IJ SOLN
INTRAMUSCULAR | Status: DC | PRN
  Administered 2021-04-30: 10 mL

## 2021-04-30 MED ORDER — PHENYLEPHRINE HCL (PRESSORS) 10 MG/ML IV SOLN
INTRAVENOUS | Status: DC | PRN
  Administered 2021-04-30: 80 ug via INTRAVENOUS
  Administered 2021-04-30 (×2): 40 ug via INTRAVENOUS

## 2021-04-30 MED ORDER — ACETAMINOPHEN 500 MG PO TABS
ORAL_TABLET | ORAL | Status: AC
  Filled 2021-04-30: qty 2

## 2021-04-30 MED ORDER — FENTANYL CITRATE (PF) 100 MCG/2ML IJ SOLN
25.0000 ug | INTRAMUSCULAR | Status: DC | PRN
  Administered 2021-04-30 (×2): 25 ug via INTRAVENOUS
  Administered 2021-04-30 (×2): 50 ug via INTRAVENOUS

## 2021-04-30 MED ORDER — LIDOCAINE HCL (CARDIAC) PF 100 MG/5ML IV SOSY
PREFILLED_SYRINGE | INTRAVENOUS | Status: DC | PRN
  Administered 2021-04-30: 60 mg via INTRAVENOUS

## 2021-04-30 MED ORDER — DIPHENHYDRAMINE HCL 25 MG PO CAPS
ORAL_CAPSULE | ORAL | Status: AC
  Filled 2021-04-30: qty 1

## 2021-04-30 MED ORDER — LACTATED RINGERS IV SOLN: INTRAVENOUS | Status: DC

## 2021-04-30 MED ORDER — SODIUM CHLORIDE 0.9 % IV SOLN
INTRAVENOUS | Status: DC | PRN
  Administered 2021-04-30: 500 mL

## 2021-04-30 MED ORDER — ACETAMINOPHEN 500 MG PO TABS
1000.0000 mg | ORAL_TABLET | Freq: Once | ORAL | Status: AC
  Administered 2021-04-30: 1000 mg via ORAL

## 2021-04-30 MED ORDER — SUGAMMADEX SODIUM 200 MG/2ML IV SOLN
INTRAVENOUS | Status: DC | PRN
  Administered 2021-04-30: 200 mg via INTRAVENOUS

## 2021-04-30 MED ORDER — MIDAZOLAM HCL 5 MG/5ML IJ SOLN
INTRAMUSCULAR | Status: DC | PRN
Start: 2021-04-30 — End: 2021-04-30
  Administered 2021-04-30: 2 mg via INTRAVENOUS

## 2021-04-30 MED ORDER — PROPOFOL 10 MG/ML IV BOLUS
INTRAVENOUS | Status: DC | PRN
  Administered 2021-04-30: 120 mg via INTRAVENOUS

## 2021-04-30 MED ORDER — DIPHENHYDRAMINE HCL 25 MG PO CAPS
25.0000 mg | ORAL_CAPSULE | Freq: Four times a day (QID) | ORAL | Status: DC | PRN
  Administered 2021-04-30: 25 mg via ORAL

## 2021-04-30 MED ORDER — HYDROCODONE-ACETAMINOPHEN 7.5-325 MG PO TABS
1.0000 | ORAL_TABLET | Freq: Four times a day (QID) | ORAL | 0 refills | Status: AC | PRN
Start: 2021-04-30 — End: 2021-05-05

## 2021-04-30 MED ORDER — MIDAZOLAM HCL 2 MG/2ML IJ SOLN
INTRAMUSCULAR | Status: AC
  Filled 2021-04-30: qty 2

## 2021-04-30 SURGICAL SUPPLY — 50 items
APL PRP STRL LF DISP 70% ISPRP (MISCELLANEOUS) ×2
BAG DECANTER FOR FLEXI CONT (MISCELLANEOUS) ×3 IMPLANT
BLADE SURG 15 STRL LF DISP TIS (BLADE) ×1 IMPLANT
BLADE SURG 15 STRL SS (BLADE) ×3
BNDG CMPR MED 10X6 ELC LF (GAUZE/BANDAGES/DRESSINGS) ×1
BNDG ELASTIC 6X10 VLCR STRL LF (GAUZE/BANDAGES/DRESSINGS) ×3 IMPLANT
CANISTER SUCT 1200ML W/VALVE (MISCELLANEOUS) ×3 IMPLANT
CHLORAPREP W/TINT 26 (MISCELLANEOUS) ×5 IMPLANT
COVER BACK TABLE 60X90IN (DRAPES) ×3 IMPLANT
COVER MAYO STAND STRL (DRAPES) ×3 IMPLANT
DRAIN CHANNEL 15F RND FF W/TCR (WOUND CARE) IMPLANT
DRAPE LAPAROSCOPIC ABDOMINAL (DRAPES) ×3 IMPLANT
DRAPE UTILITY XL STRL (DRAPES) ×3 IMPLANT
DRSG PAD ABDOMINAL 8X10 ST (GAUZE/BANDAGES/DRESSINGS) ×6 IMPLANT
ELECT BLADE 4.0 EZ CLEAN MEGAD (MISCELLANEOUS) ×3
ELECT REM PT RETURN 9FT ADLT (ELECTROSURGICAL) ×3
ELECTRODE BLDE 4.0 EZ CLN MEGD (MISCELLANEOUS) IMPLANT
ELECTRODE REM PT RTRN 9FT ADLT (ELECTROSURGICAL) ×1 IMPLANT
EVACUATOR SILICONE 100CC (DRAIN) IMPLANT
FUNNEL KELLER 2 DISP (MISCELLANEOUS) ×3 IMPLANT
GAUZE SPONGE 4X4 12PLY STRL (GAUZE/BANDAGES/DRESSINGS) ×3 IMPLANT
GLOVE SURG ENC TEXT LTX SZ7.5 (GLOVE) ×5 IMPLANT
GOWN STRL REUS W/ TWL LRG LVL3 (GOWN DISPOSABLE) ×2 IMPLANT
GOWN STRL REUS W/TWL LRG LVL3 (GOWN DISPOSABLE) ×6
IMPL BREAST GEL 380CC (Breast) IMPLANT
IMPLANT BREAST GEL 380CC (Breast) ×6 IMPLANT
NDL HYPO 25X1 1.5 SAFETY (NEEDLE) ×1 IMPLANT
NEEDLE HYPO 25X1 1.5 SAFETY (NEEDLE) ×3 IMPLANT
PACK BASIN DAY SURGERY FS (CUSTOM PROCEDURE TRAY) ×3 IMPLANT
PENCIL SMOKE EVACUATOR (MISCELLANEOUS) ×3 IMPLANT
PIN SAFETY STERILE (MISCELLANEOUS) IMPLANT
RETRACTOR ONETRAX LX 135X30 (MISCELLANEOUS) ×2 IMPLANT
SIZER BREAST REUSE 355CC (SIZER) ×3
SIZER BREAST REUSE 380CC (SIZER) ×6
SIZER BRST REUSE 355CC (SIZER) IMPLANT
SIZER BRST REUSE 380CC (SIZER) IMPLANT
SLEEVE SCD COMPRESS KNEE MED (STOCKING) ×3 IMPLANT
SPONGE T-LAP 18X18 ~~LOC~~+RFID (SPONGE) ×5 IMPLANT
STAPLER VISISTAT 35W (STAPLE) ×3 IMPLANT
STRIP SUTURE WOUND CLOSURE 1/2 (MISCELLANEOUS) ×6 IMPLANT
SUT PDS 3-0 CT2 (SUTURE) ×6
SUT PDS II 3-0 CT2 27 ABS (SUTURE) ×2 IMPLANT
SUT VLOC 90 P-14 23 (SUTURE) ×5 IMPLANT
SYR BULB IRRIG 60ML STRL (SYRINGE) ×3 IMPLANT
SYR CONTROL 10ML LL (SYRINGE) ×3 IMPLANT
TOWEL GREEN STERILE FF (TOWEL DISPOSABLE) ×5 IMPLANT
TUBE CONNECTING 20'X1/4 (TUBING) ×1
TUBE CONNECTING 20X1/4 (TUBING) ×2 IMPLANT
UNDERPAD 30X36 HEAVY ABSORB (UNDERPADS AND DIAPERS) ×6 IMPLANT
YANKAUER SUCT BULB TIP NO VENT (SUCTIONS) ×3 IMPLANT

## 2021-04-30 NOTE — Transfer of Care (Signed)
Immediate Anesthesia Transfer of Care Note  Patient: Tammy Fowler  Procedure(s) Performed: REMOVAL OF BILATERAL TISSUE EXPANDERS WITH PLACEMENT OF BILATERAL BREAST IMPLANTS (Bilateral: Breast)  Patient Location: PACU  Anesthesia Type:General  Level of Consciousness: awake  Airway & Oxygen Therapy: Patient Spontanous Breathing and Patient connected to face mask oxygen  Post-op Assessment: Report given to RN and Post -op Vital signs reviewed and stable  Post vital signs: Reviewed and stable  Last Vitals:  Vitals Value Taken Time  BP    Temp    Pulse    Resp    SpO2      Last Pain:  Vitals:   04/30/21 0829  TempSrc: Oral  PainSc: 0-No pain         Complications: No notable events documented.

## 2021-04-30 NOTE — Telephone Encounter (Signed)
Returned patients call. Advised prescriptions were sent in on 04/10/2021 to correct pharmacy. If medication has been waiting to be picked up over time, the pharmacy will put back in stock.

## 2021-04-30 NOTE — Interval H&P Note (Signed)
History and Physical Interval Note:  04/30/2021 9:55 AM  [name]  has presented today for surgery, with the diagnosis of Malignant neoplasm of left breast in female, estrogen receptor positive.  The various methods of treatment have been discussed with the patient and family. After consideration of risks, benefits and other options for treatment, the patient has consented to  Procedure(s): REMOVAL OF BILATERAL TISSUE EXPANDERS WITH PLACEMENT OF BILATERAL BREAST IMPLANTS (Bilateral) as a surgical intervention.  The patient's history has been reviewed, patient examined, no change in status, stable for surgery.  I have reviewed the patient's chart and labs.  Questions were answered to the patient's satisfaction.     Tammy Fowler

## 2021-04-30 NOTE — Anesthesia Postprocedure Evaluation (Signed)
Anesthesia Post Note  Patient: Tammy Fowler  Procedure(s) Performed: REMOVAL OF BILATERAL TISSUE EXPANDERS WITH PLACEMENT OF BILATERAL BREAST IMPLANTS (Bilateral: Breast)     Patient location during evaluation: PACU Anesthesia Type: General Level of consciousness: awake and alert and oriented Pain management: pain level controlled Vital Signs Assessment: post-procedure vital signs reviewed and stable Respiratory status: spontaneous breathing, nonlabored ventilation and respiratory function stable Cardiovascular status: blood pressure returned to baseline Postop Assessment: no apparent nausea or vomiting Anesthetic complications: no   No notable events documented.  Last Vitals:  Vitals:   04/30/21 1309 04/30/21 1340  BP:  (!) 124/58  Pulse: 84 79  Resp: (!) 22 16  Temp:  36.7 C  SpO2: 94% 98%    Last Pain:  Vitals:   04/30/21 1330  TempSrc:   PainSc: Tammy Fowler

## 2021-04-30 NOTE — Op Note (Addendum)
Operative Note   DATE OF OPERATION: 04/30/2021  SURGICAL DEPARTMENT: Plastic Surgery  PREOPERATIVE DIAGNOSES: Bilateral breast reconstruction  POSTOPERATIVE DIAGNOSES:  same  PROCEDURE: 1.  Exchange bilateral breast tissue expanders for gel implants 2.  Left breast capsulotomy  SURGEON: Talmadge Coventry, MD  ASSISTANT: Lennice Sites, MD and [facility] Above assisted throughout the case.  They were essential in retraction and counter traction when needed to make the case progress smoothly.  This retraction and assistance made it possible to see the tissue planes for the procedure.  The assistance was needed for hemostasis, tissue re-approximation and closure of the incision site.   ANESTHESIA:  General.   COMPLICATIONS: None.   INDICATIONS FOR PROCEDURE:  The patient, Tammy Fowler is a 70 y.o. female born on 11-27-1950, is here for treatment of bilateral breast reconstruction MRN: [id number]  CONSENT:  Informed consent was obtained directly from the patient. Risks, benefits and alternatives were fully discussed. Specific risks including but not limited to bleeding, infection, hematoma, seroma, scarring, pain, contracture, asymmetry, wound healing problems, and need for further surgery were all discussed. The patient did have an ample opportunity to have questions answered to satisfaction.   DESCRIPTION OF PROCEDURE:  The patient was taken to the operating room. SCDs were placed and antibiotics were given.  General anesthesia was administered.  The patient's operative site was prepped and draped in a sterile fashion. A time out was performed and all information was confirmed to be correct.  Started by marking out her previous inframammary incision.  Marcaine with epinephrine was injected in this area.  I then incised along both incisions with a 15 blade and dissected down to the capsule which was entered with cautery.  Fluid was evacuated from the expanders and they were both  removed.  380 cc sizers were then placed which seem to be appropriate size for.  Inferior capsulotomy was done on the left side to try to lower the pocket in addition to a superolateral capsulotomy to help with some tethering.  Additionally a small amount of inferior and lateral skin was excised on the right side to help with shape and contour match.  This seemed to give a nice result and she was set up to check for appropriate positioning which was the case.  Both sizers were then removed.  Both pockets were irrigated with triple antibiotic solution.  I changed my gloves.  Implants were then brought onto the field.  These were [employer]or high-profile extra implants with 380 cc of volume.  Serial number on the left side [id number].  Serial number on the right side [id number].  These were placed with a Keller funnel.  The ADM was closed with 3-0 PDS sutures.  Skin was closed with interrupted buried 3-0 PDS sutures and buried in sorb staples followed by 3 oh V-Loc.  Steri-Strips were applied followed by soft compressive dressing.  The patient tolerated the procedure well.  There were no complications. The patient was allowed to wake from anesthesia, extubated and taken to the recovery room in satisfactory condition.

## 2021-04-30 NOTE — Anesthesia Procedure Notes (Signed)
Procedure Name: Intubation Date/Time: 04/30/2021 10:45 AM Performed by: Tawni Millers, CRNA Pre-anesthesia Checklist: Patient identified, Emergency Drugs available, Suction available and Patient being monitored Patient Re-evaluated:Patient Re-evaluated prior to induction Oxygen Delivery Method: Circle system utilized Preoxygenation: Pre-oxygenation with 100% oxygen Induction Type: IV induction Ventilation: Mask ventilation without difficulty Laryngoscope Size: Mac and 3 Grade View: Grade I Tube type: Oral Tube size: 7.0 mm Number of attempts: 1 Airway Equipment and Method: Stylet and Oral airway Placement Confirmation: ETT inserted through vocal cords under direct vision, positive ETCO2 and breath sounds checked- equal and bilateral Tube secured with: Tape Dental Injury: Teeth and Oropharynx as per pre-operative assessment

## 2021-04-30 NOTE — Discharge Instructions (Addendum)
Activity As tolerated: NO showers for 3 days. Keep ACE wrap on breasts until then. After showering, put ACE wrap back on, this is important for compression. NO driving while in pain, taking pain medication or if you are unable to safely react to traffic. No heavy activities Take Pain medication (Norco) as needed for severe pain. Otherwise, you can use ibuprofen or tylenol as needed. Avoid more than 3,000 mg of tylenol in 24 hours. Norco has 325mg  of tylenol per dose.  Diet: Regular. Drink plenty of fluids and eat healthy, high protein, low carbs.  Wound Care: Keep dressing clean & dry. You may change bandages after showering if you continue to notice some drainage. You can reuse bandages if they are not dirty/soiled.  Special Instructions: Call Doctor if any unusual problems occur such as pain, excessive Bleeding, unrelieved Nausea/vomiting, Fever &/or chills  Follow-up appointment: Scheduled for next week.   Post Anesthesia Home Care Instructions  Activity: Get plenty of rest for the remainder of the day. A responsible individual must stay with you for 24 hours following the procedure.  For the next 24 hours, DO NOT: -Drive a car -Paediatric nurse -Drink alcoholic beverages -Take any medication unless instructed by your physician -Make any legal decisions or sign important papers.  Meals: Start with liquid foods such as gelatin or soup. Progress to regular foods as tolerated. Avoid greasy, spicy, heavy foods. If nausea and/or vomiting occur, drink only clear liquids until the nausea and/or vomiting subsides. Call your physician if vomiting continues.  Special Instructions/Symptoms: Your throat may feel dry or sore from the anesthesia or the breathing tube placed in your throat during surgery. If this causes discomfort, gargle with warm salt water. The discomfort should disappear within 24 hours.  If you had a scopolamine patch placed behind your ear for the management of post-  operative nausea and/or vomiting:  1. The medication in the patch is effective for 72 hours, after which it should be removed.  Wrap patch in a tissue and discard in the trash. Wash hands thoroughly with soap and water. 2. You may remove the patch earlier than 72 hours if you experience unpleasant side effects which may include dry mouth, dizziness or visual disturbances. 3. Avoid touching the patch. Wash your hands with soap and water after contact with the patch.    You had Oxycodone 5mg  and Benadryl 25mg  at 1:30pm

## 2021-04-30 NOTE — Telephone Encounter (Signed)
Patient had surgery today with Dr. Claudia Desanctis and we need to call in her pain medication.  Please call.  *Patient's preferred pharmacy is [facility] in [place], [place].

## 2021-04-30 NOTE — Progress Notes (Signed)
Post-op pain meds, previously not picked up. PDMP reviewed.

## 2021-05-01 ENCOUNTER — Encounter (HOSPITAL_BASED_OUTPATIENT_CLINIC_OR_DEPARTMENT_OTHER): Payer: Self-pay | Admitting: Plastic Surgery

## 2021-05-01 LAB — SURGICAL PATHOLOGY

## 2021-05-02 ENCOUNTER — Other Ambulatory Visit: Payer: Self-pay | Admitting: Oncology

## 2021-05-08 ENCOUNTER — Ambulatory Visit (INDEPENDENT_AMBULATORY_CARE_PROVIDER_SITE_OTHER): Payer: Medicare Other | Admitting: Plastic Surgery

## 2021-05-08 ENCOUNTER — Other Ambulatory Visit: Payer: Self-pay

## 2021-05-08 DIAGNOSIS — C50912 Malignant neoplasm of unspecified site of left female breast: Secondary | ICD-10-CM

## 2021-05-08 DIAGNOSIS — Z170 Estrogen receptor positive status [ER+]: Secondary | ICD-10-CM

## 2021-05-08 NOTE — Progress Notes (Signed)
Patient presents postop after exchange of her tissue expanders for gel implants.  She is overall very happy.  On exam everything looks like it is healing fine.  She has reasonable shape size and symmetry and no signs of any incision problems.  We will plan to have her continue to avoid strenuous activity and see her again in a few weeks.  All of her questions were answered.

## 2021-05-23 ENCOUNTER — Encounter: Payer: Self-pay | Admitting: Oncology

## 2021-05-30 ENCOUNTER — Other Ambulatory Visit: Payer: Self-pay

## 2021-05-30 ENCOUNTER — Ambulatory Visit (INDEPENDENT_AMBULATORY_CARE_PROVIDER_SITE_OTHER): Payer: Medicare PPO | Admitting: Plastic Surgery

## 2021-05-30 DIAGNOSIS — Z719 Counseling, unspecified: Secondary | ICD-10-CM

## 2021-05-30 DIAGNOSIS — C50912 Malignant neoplasm of unspecified site of left female breast: Secondary | ICD-10-CM

## 2021-05-30 DIAGNOSIS — Z170 Estrogen receptor positive status [ER+]: Secondary | ICD-10-CM

## 2021-05-30 NOTE — Progress Notes (Signed)
Patient presents over a month out from exchange of bilateral breast tissue expanders for gel implants.  She is overall very happy.  She does report a little tenderness along the lateral aspect of her incision on the right side.  On exam this looks to be doing well overall.  I believe she has a little bit of inflammation around the buried stitch along the inframammary fold incision on the right.  I was unable to see or grasp any spitting sutures are in sorb staple.  I encouraged her to watch this and if things do change to let us know.  Otherwise there is no erythema or signs of any complication.  She is very happy with her result thus far and I will see her again in a few months to check her progress.  She is interested in laser resurfacing of her face and will see if we can arrange getting her some of that information.

## 2021-06-06 NOTE — Progress Notes (Signed)
[facility]  [address] [place],  [place]  [address] [phone]  Clinic Day:  06/07/2021  Referring physician: [name], FNP  This document serves as a record of services personally performed by Tammy Poisson, MD. It was created on their behalf by Curry,Lauren E, a trained medical scribe. The creation of this record is based on the scribe's personal observations and the provider's statements to them.  CHIEF COMPLAINT:  CC:  History of hormone and HER2/neu receptor positive breast cancer   Current Treatment:   Adjuvant HER2 targeted therapy with trastuzumab/pertuzumab  HISTORY OF PRESENT ILLNESS:  Tammy Fowler is a 71 y.o. 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 Excisional biopsy revealed a 2 cm, grade 2, infiltrating ductal carcinoma in the left lower quadrant.  Estrogen and progesterone receptors were positive and her 2 Neu negative.  Tumor was present at the margins, so she underwent re-excision and axillary dissection.  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  Four of sixteen lymph nodes were positive for metastasis.  All surgical margins were free of tumor involvement.  She received adjuvant chemotherapy with with dose dense Adriamycin and Cytoxan for 4 cycles, followed by dose dense Taxol for 4 cycles, follwed by adjuvant radiation of the left breast.  She was placed on anastrozole 1 mg daily in April 2005 and completed 10 years of therapy in April 2015.  She underwent left latissimus dorsi muscle reconstruction of the left breast and right breast reduction.  She underwent total abdominal hysterectomy and bilateral salpingo-oophorectomy prior to starting anastrozole.  She had excision of a benign mass from the left breast in 2009.  She underwent testing for BRCA mutations, including BART, in April 2013,  which was negative, however, we did not have the results.  She has a history of osteoporosis, previously treated with zolendronic acid every 6 months, which was discontinued when she completed anastrozole.     We began seeing her in July 2014, when she relocated to this area.  Bone density scan in December 2015 was normal.  Her personal and family history wass suggestive of a hereditary cancer syndrome, so we recommended additional genetic testing with the [employer] myRisk Update Panel test to look for a mutation in other genes known to increase the risk for breast cancer and as well as other cancers.  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  These results were reviewed with her in detail and she was scheduled for an breast MRI which did not reveal any evidence of malignancy. Bone density in January 2019 was normal.  Breast MRI in August 2019 did not reveal any evidence of malignancy  She developed diabetes and had an MRI abdomen since pancreatic cancer is increased with the ATM mutation, which was negative.  She  had colonoscopy in early 2020 by Dr. Marcell Anger in [place] and had 2 polyps removed. Repeat colonoscopy in 5 years was recommended. Bilateral screening mammogram in March 2020 and April 2021 did not reveal any evidence of malignancy.   She was seen in January 2022 with a new palpable mass in the left breast.  Diagnostic left mammogram revealed a suspicious mass measuring 2.0 cm at 10  o'clock. Diagnostic right mammogram did not reveal any evidence of malignancy.  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  Estrogen receptor was 30% positive and progesterone receptor negative and HER2 receptor positive.  Ki67 was 25%.   However, there was no breast tissue in the sample, so this was felt to be more likely a recurrence rather than a new breast primary, even though it has been many years. But her previous cancer was HER2 negative and ER positive.    She started treatment with [facility] on February 15th.  She experienced decreased appetite, taste changes, mouth sores, and diarrhea despite imodium, and was treated with supportive care. She did have transient weight loss and hypomagnesemia. MRI brain did not reveal any intracranial metastasis.  She was given IV fluids and placed on lorazepam 0.5 mg every 6 hours as needed for nausea, vomiting and anxiety.  She was also placed on dronabinol 5 mg at bedtime for nausea, vomiting and appetite.  At her visit on May 6th, her side effects were fairly well controlled and she had gained 5 lb  She proceeded with a 5th cycle of TCHP at 25% dose reduction on May 11th.  She completed 6 cycles by July. She underwent bilateral mastectomy and reconstruction on July 26th with Dr. Donne Hazel and Dr. Claudia Desanctis.  Final pathology revealed residual carcinoma of the left breast status post neoadjuvant therapy, measuring 1.5 cm.  Margins uninvolved by carcinoma.  In the right breast, no malignancy was identified. After her last dose of Herceptin/Perjeta she had severe bilateral knee pain which persisted and she was given a prescription for Mobic.  INTERVAL HISTORY:  Tammy Fowler is here for routine follow up after undergoing bilateral breast reconstruction on December 13th. She received 3 cycle of maintenance trastuzumab/pertuzumab prior to her procedure. She complains that she had severe pain of the knees limiting her ability to ambulate. She states that she is doing well since her procedure. She continues to have arthralgias of the bilateral knees, and she has received prior cortisone injections. Hemoglobin has decreased from 12.4 to 11.6, and white count and platelets are normal. Chemistries are unremarkable except for a  BUN of 28 and a fasting blood glucose of 174. Her  appetite is good, and her weight is stable since her last visit.  She denies fever, chills or other signs of infection.  She denies nausea, vomiting, bowel issues, or abdominal pain.  She denies sore throat, cough, dyspnea, or chest pain.  REVIEW OF SYSTEMS:  Review of Systems  Constitutional: Negative.  Negative for appetite change, chills, fatigue, fever and unexpected weight change.  HENT:  Negative.    Eyes: Negative.   Respiratory: Negative.  Negative for chest tightness, cough, hemoptysis, shortness of breath and wheezing.   Cardiovascular: Negative.  Negative for chest pain, leg swelling and palpitations.  Gastrointestinal: Negative.  Negative for abdominal distention, abdominal pain, blood in stool, constipation, diarrhea, nausea and vomiting.  Endocrine: Negative.   Genitourinary: Negative.  Negative for difficulty urinating, dysuria, frequency and hematuria.   Musculoskeletal:  Positive for arthralgias (of the bilateral knees). Negative for back pain, flank pain, gait problem and myalgias.  Skin: Negative.   Neurological: Negative.  Negative for dizziness, extremity weakness, gait problem, headaches, light-headedness, numbness, seizures and speech difficulty.  Hematological: Negative.   Psychiatric/Behavioral: Negative.  Negative for depression and sleep disturbance. The patient is not nervous/anxious.     VITALS:  Blood  pressure (!) 154/72, pulse 70, temperature 98.1 F (36.7 C), temperature source Oral, resp. rate 16, height 5' 2"  (1.575 m), weight 151 lb 12.8 oz (68.9 kg), SpO2 98 %.    Wt Readings from Last 3 Encounters:  06/07/21 151 lb 12.8 oz (68.9 kg)  04/30/21 146 lb 9.7 oz (66.5 kg)  04/10/21 143 lb 9.6 oz (65.1 kg)    Body mass index is 27.76 kg/m.  Performance status (ECOG): 1 - Symptomatic but completely ambulatory  PHYSICAL EXAM:  Physical Exam Constitutional:      General: She is not in acute distress.     Appearance: Normal appearance. She is normal weight.  HENT:     Head: Normocephalic and atraumatic.  Eyes:     General: No scleral icterus.    Extraocular Movements: Extraocular movements intact.     Conjunctiva/sclera: Conjunctivae normal.     Pupils: Pupils are equal, round, and reactive to light.  Cardiovascular:     Rate and Rhythm: Normal rate and regular rhythm.     Pulses: Normal pulses.     Heart sounds: Normal heart sounds. No murmur heard.   No friction rub. No gallop.  Pulmonary:     Effort: Pulmonary effort is normal. No respiratory distress.     Breath sounds: Normal breath sounds.  Chest:     Comments: Bilateral reconstructions are negative with no signs of problems and are healing well. Abdominal:     General: Bowel sounds are normal. There is no distension.     Palpations: Abdomen is soft. There is no hepatomegaly, splenomegaly or mass.     Tenderness: There is no abdominal tenderness.  Musculoskeletal:        General: Normal range of motion.     Cervical back: Normal range of motion and neck supple.     Right lower leg: No edema.     Left lower leg: No edema.  Lymphadenopathy:     Cervical: No cervical adenopathy.  Skin:    General: Skin is warm and dry.  Neurological:     General: No focal deficit present.     Mental Status: She is alert and oriented to person, place, and time. Mental status is at baseline.  Psychiatric:        Mood and Affect: Mood normal.        Behavior: Behavior normal.        Thought Content: Thought content normal.        Judgment: Judgment normal.   LABS:   CBC Latest Ref Rng & Units 06/07/2021 02/26/2021 02/05/2021  WBC - 5.1 7.4 4.4  Hemoglobin 12.0 - 16.0 11.6(A) 12.4 11.5(A)  Hematocrit 36 - 46 36 37 35(A)  Platelets 150 - 399 224 261 230   CMP Latest Ref Rng & Units 06/07/2021 04/25/2021 02/26/2021  Glucose 70 - 99 mg/dL - 199(H) -  BUN 4 - 21 28(A) 21 31(A)  Creatinine 0.5 - 1.1 1.0 0.69 1.0  Sodium 137 - 147 142 139 138   Potassium 3.4 - 5.3 4.5 5.6(H) 5.7(A)  Chloride 99 - 108 109(A) 101 103  CO2 13 - 22 27(A) 29 27(A)  Calcium 8.7 - 10.7 10.1 10.2 9.8  Alkaline Phos 25 - 125 80 - 75  AST 13 - 35 30 - 30  ALT 7 - 35 27 - 25    STUDIES:  No results found.    HISTORY:   Allergies:  Allergies  Allergen Reactions   Oxycodone Itching   Codeine  Other (See Comments)    Other reaction(s): Other (See Comments) Unknown Unknown    Lisinopril     Other reaction(s): Cough (ALLERGY/intolerance)    Current Medications: Current Outpatient Medications  Medication Sig Dispense Refill   ACCU-CHEK GUIDE test strip daily. for testing as directed     aspirin 81 MG chewable tablet Chew by mouth.     benzonatate (TESSALON) 100 MG capsule      calcium-vitamin D 250-100 MG-UNIT tablet Take by mouth.     diphenoxylate-atropine (LOMOTIL) 2.5-0.025 MG tablet Take 2 tablets by mouth 4 (four) times daily as needed for diarrhea or loose stools. 60 tablet 3   Docusate Sodium (DSS) 100 MG CAPS Take by mouth.     DULoxetine (CYMBALTA) 60 MG capsule Take 1 capsule by mouth daily.     FLUoxetine (PROZAC) 40 MG capsule Take by mouth.     gabapentin (NEURONTIN) 300 MG capsule TAKE 1 CAPSULE BY MOUTH THREE TIMES A DAY 90 capsule 0   HYDROcodone-acetaminophen (NORCO) 7.5-325 MG tablet TAKE 1 TABLET BY MOUTH EVERY 4 HOURS AS NEEDED FOR MODERATE PAIN 120 tablet 0   LORazepam (ATIVAN) 0.5 MG tablet Take 1 tablet (0.5 mg total) by mouth every 6 (six) hours as needed for anxiety. 90 tablet 0   losartan (COZAAR) 50 MG tablet daily.     magnesium oxide (MAG-OX) 400 (241.3 Mg) MG tablet Take 1 tablet (400 mg total) by mouth 2 (two) times daily. 60 tablet 1   meclizine (ANTIVERT) 25 MG tablet Take 1 tablet (25 mg total) by mouth 3 (three) times daily as needed for dizziness. 30 tablet 0   meloxicam (MOBIC) 15 MG tablet Take 1 tablet (15 mg total) by mouth daily. 30 tablet 5   metFORMIN (GLUCOPHAGE) 500 MG tablet 2 (two) times daily.      Multiple Vitamins-Minerals (THERA-M) TABS Take by mouth.     ondansetron (ZOFRAN) 4 MG tablet Take 1 tablet (4 mg total) by mouth every 8 (eight) hours as needed for nausea or vomiting. 20 tablet 0   prochlorperazine (COMPAZINE) 10 MG tablet Take 1 tablet (10 mg total) by mouth every 6 (six) hours as needed (Nausea or vomiting). 30 tablet 1   rosuvastatin (CRESTOR) 40 MG tablet daily.     traMADol (ULTRAM) 50 MG tablet Take 1-2 tablets (50-100 mg total) by mouth every 6 (six) hours as needed. 60 tablet 0   vitamin B-12 (CYANOCOBALAMIN) 500 MCG tablet Take 500 mcg by mouth daily.     Vitamin D, Ergocalciferol, (DRISDOL) 1.25 MG (50000 UNIT) CAPS capsule Take 50,000 Units by mouth 2 (two) times a week.     No current facility-administered medications for this visit.   Facility-Administered Medications Ordered in Other Visits  Medication Dose Route Frequency Provider Last Rate Last Admin   acetaminophen (TYLENOL) tablet 650 mg  650 mg Oral Once [name], MD       diphenhydrAMINE (BENADRYL) capsule 25 mg  25 mg Oral Once [name], MD       sodium chloride flush (NS) 0.9 % injection 10 mL  10 mL Intracatheter PRN [name], MD   10 mL at 01/14/21 1624     ASSESSMENT & PLAN:   Assessment:  1.  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 This is felt to  most likely represent a recurrence rather than a 2nd breast primary.  However, this is now HER 2 positive in addition to ER positive.  She completed 6 cycles of neoadjuvant TCHP in July.  After 4 cycles, the doses were reduced by 25%, but she still was experiencing adverse effects. She completed chemotherapy. She received three cycles of HER2 targeted therapy prior to reconstructive surgery. She is due for 11 postop  doses and is willing to resume the Herceptin/Perjeta.    2.  Significant arthralgias, especially of the knees.  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 This is better controlled with cortisone injections and Mobic 15 mg daily.    3.  Mild anemia, most likely due to surgical blood loss.  4.  ATM gene mutation.  She has had bilateral mastectomies.  She is also at increased risk for pancreatic cancer but there are no standardized screening methods.  Plan:     She is doing well following her bilateral breast reconstruction and has been cleared to resume maintenance HER2 targeted therapy. We will schedule her for a 4th cycle of maintenance trastuzumab/pertuzumab on Monday. We will see her back in 3 weeks with CBC and CMP prior to a 5th cycle.  We will repeat ECHO in March/April. She verbalizes understanding of and agreement to the plans discussed today. She knows to call the office should any new questions or concerns arise.    I, Rita Ohara, am acting as scribe for [name], MD  I have reviewed this report as typed by the medical scribe, and it is complete and accurate.  Tammy Poisson MD [place] [facility]

## 2021-06-07 ENCOUNTER — Other Ambulatory Visit: Payer: Self-pay | Admitting: Oncology

## 2021-06-07 ENCOUNTER — Inpatient Hospital Stay: Payer: Medicare PPO | Attending: Oncology | Admitting: Oncology

## 2021-06-07 ENCOUNTER — Inpatient Hospital Stay: Payer: Medicare PPO

## 2021-06-07 ENCOUNTER — Other Ambulatory Visit: Payer: Self-pay

## 2021-06-07 ENCOUNTER — Encounter: Payer: Self-pay | Admitting: Oncology

## 2021-06-07 ENCOUNTER — Other Ambulatory Visit: Payer: Self-pay | Admitting: Hematology and Oncology

## 2021-06-07 VITALS — BP 154/72 | HR 70 | Temp 98.1°F | Resp 16 | Ht 62.0 in | Wt 151.8 lb

## 2021-06-07 DIAGNOSIS — C50919 Malignant neoplasm of unspecified site of unspecified female breast: Secondary | ICD-10-CM

## 2021-06-07 DIAGNOSIS — C50912 Malignant neoplasm of unspecified site of left female breast: Secondary | ICD-10-CM

## 2021-06-07 DIAGNOSIS — D649 Anemia, unspecified: Secondary | ICD-10-CM | POA: Diagnosis not present

## 2021-06-07 DIAGNOSIS — C50212 Malignant neoplasm of upper-inner quadrant of left female breast: Secondary | ICD-10-CM | POA: Diagnosis not present

## 2021-06-07 DIAGNOSIS — M25561 Pain in right knee: Secondary | ICD-10-CM | POA: Diagnosis not present

## 2021-06-07 DIAGNOSIS — M810 Age-related osteoporosis without current pathological fracture: Secondary | ICD-10-CM | POA: Diagnosis not present

## 2021-06-07 DIAGNOSIS — M25562 Pain in left knee: Secondary | ICD-10-CM

## 2021-06-07 DIAGNOSIS — C50012 Malignant neoplasm of nipple and areola, left female breast: Secondary | ICD-10-CM

## 2021-06-07 DIAGNOSIS — C50011 Malignant neoplasm of nipple and areola, right female breast: Secondary | ICD-10-CM

## 2021-06-07 DIAGNOSIS — Z170 Estrogen receptor positive status [ER+]: Secondary | ICD-10-CM | POA: Insufficient documentation

## 2021-06-07 DIAGNOSIS — Z5112 Encounter for antineoplastic immunotherapy: Secondary | ICD-10-CM | POA: Insufficient documentation

## 2021-06-07 DIAGNOSIS — Z1501 Genetic susceptibility to malignant neoplasm of breast: Secondary | ICD-10-CM

## 2021-06-07 LAB — BASIC METABOLIC PANEL
BUN: 28 — AB (ref 4–21)
CO2: 27 — AB (ref 13–22)
Chloride: 109 — AB (ref 99–108)
Creatinine: 1 (ref 0.5–1.1)
Glucose: 174
Potassium: 4.5 (ref 3.4–5.3)
Sodium: 142 (ref 137–147)

## 2021-06-07 LAB — CBC
MCV: 101 — AB (ref 81–99)
RBC: 3.57 — AB (ref 3.87–5.11)

## 2021-06-07 LAB — HEPATIC FUNCTION PANEL
ALT: 27 (ref 7–35)
AST: 30 (ref 13–35)
Alkaline Phosphatase: 80 (ref 25–125)
Bilirubin, Total: 0.7

## 2021-06-07 LAB — CBC AND DIFFERENTIAL
HCT: 36 (ref 36–46)
Hemoglobin: 11.6 — AB (ref 12.0–16.0)
Neutrophils Absolute: 3.67
Platelets: 224 (ref 150–399)
WBC: 5.1

## 2021-06-07 LAB — COMPREHENSIVE METABOLIC PANEL
Albumin: 4.4 (ref 3.5–5.0)
Calcium: 10.1 (ref 8.7–10.7)

## 2021-06-07 NOTE — Progress Notes (Signed)
Updated trastuzumab dose with current weight. Continue 6mg /kg, no reload on 06/10/21.  Raul [place], [place], BCPS, BCOP 06/07/2021 2:11 PM

## 2021-06-10 ENCOUNTER — Inpatient Hospital Stay: Payer: Medicare PPO

## 2021-06-10 ENCOUNTER — Other Ambulatory Visit: Payer: Self-pay

## 2021-06-10 VITALS — BP 146/94 | HR 90 | Temp 98.1°F | Resp 18 | Ht 62.0 in | Wt 149.0 lb

## 2021-06-10 DIAGNOSIS — C50912 Malignant neoplasm of unspecified site of left female breast: Secondary | ICD-10-CM

## 2021-06-10 DIAGNOSIS — C50212 Malignant neoplasm of upper-inner quadrant of left female breast: Secondary | ICD-10-CM | POA: Diagnosis present

## 2021-06-10 DIAGNOSIS — Z5112 Encounter for antineoplastic immunotherapy: Secondary | ICD-10-CM | POA: Diagnosis present

## 2021-06-10 DIAGNOSIS — Z170 Estrogen receptor positive status [ER+]: Secondary | ICD-10-CM | POA: Diagnosis not present

## 2021-06-10 MED ORDER — SODIUM CHLORIDE 0.9 % IV SOLN: Freq: Once | INTRAVENOUS | Status: AC

## 2021-06-10 MED ORDER — TRASTUZUMAB-ANNS CHEMO 150 MG IV SOLR
6.0000 mg/kg | Freq: Once | INTRAVENOUS | Status: AC
  Administered 2021-06-10: 420 mg via INTRAVENOUS
  Filled 2021-06-10: qty 20

## 2021-06-10 MED ORDER — SODIUM CHLORIDE 0.9 % IV SOLN
420.0000 mg | Freq: Once | INTRAVENOUS | Status: AC
  Administered 2021-06-10: 420 mg via INTRAVENOUS
  Filled 2021-06-10: qty 14

## 2021-06-10 MED ORDER — DIPHENHYDRAMINE HCL 25 MG PO CAPS
25.0000 mg | ORAL_CAPSULE | Freq: Once | ORAL | Status: AC
  Administered 2021-06-10: 25 mg via ORAL
  Filled 2021-06-10: qty 1

## 2021-06-10 MED ORDER — ACETAMINOPHEN 325 MG PO TABS
650.0000 mg | ORAL_TABLET | Freq: Once | ORAL | Status: AC
  Administered 2021-06-10: 650 mg via ORAL
  Filled 2021-06-10: qty 2

## 2021-06-10 MED ORDER — SODIUM CHLORIDE 0.9% FLUSH
10.0000 mL | INTRAVENOUS | Status: DC | PRN
  Administered 2021-06-10: 10 mL

## 2021-06-10 MED ORDER — HEPARIN SOD (PORK) LOCK FLUSH 100 UNIT/ML IV SOLN
500.0000 [IU] | Freq: Once | INTRAVENOUS | Status: AC | PRN
  Administered 2021-06-10: 500 [IU]

## 2021-06-10 NOTE — Patient Instructions (Signed)
Pertuzumab injection What is this medication? PERTUZUMAB (per TOOZ ue mab) is a monoclonal antibody. It is used to treat breast cancer. This medicine may be used for other purposes; ask your health care provider or pharmacist if you have questions. COMMON BRAND NAME(S): PERJETA What should I tell my care team before I take this medication? They need to know if you have any of these conditions: heart disease heart failure high blood pressure history of irregular heart beat recent or ongoing radiation therapy an unusual or allergic reaction to pertuzumab, other medicines, foods, dyes, or preservatives pregnant or trying to get pregnant breast-feeding How should I use this medication? This medicine is for infusion into a vein. It is given by a health care professional in a hospital or clinic setting. Talk to your pediatrician regarding the use of this medicine in children. Special care may be needed. Overdosage: If you think you have taken too much of this medicine contact a poison control center or emergency room at once. NOTE: This medicine is only for you. Do not share this medicine with others. What if I miss a dose? It is important not to miss your dose. Call your doctor or health care professional if you are unable to keep an appointment. What may interact with this medication? Interactions are not expected. Give your health care provider a list of all the medicines, herbs, non-prescription drugs, or dietary supplements you use. Also tell them if you smoke, drink alcohol, or use illegal drugs. Some items may interact with your medicine. This list may not describe all possible interactions. Give your health care provider a list of all the medicines, herbs, non-prescription drugs, or dietary supplements you use. Also tell them if you smoke, drink alcohol, or use illegal drugs. Some items may interact with your medicine. What should I watch for while using this medication? Your condition  will be monitored carefully while you are receiving this medicine. Report any side effects. Continue your course of treatment even though you feel ill unless your doctor tells you to stop. Do not become pregnant while taking this medicine or for 7 months after stopping it. Women should inform their doctor if they wish to become pregnant or think they might be pregnant. Women of child-bearing potential will need to have a negative pregnancy test before starting this medicine. There is a potential for serious side effects to an unborn child. Talk to your health care professional or pharmacist for more information. Do not breast-feed an infant while taking this medicine or for 7 months after stopping it. Women must use effective birth control with this medicine. Call your doctor or health care professional for advice if you get a fever, chills or sore throat, or other symptoms of a cold or flu. Do not treat yourself. Try to avoid being around people who are sick. You may experience fever, chills, and headache during the infusion. Report any side effects during the infusion to your health care professional. What side effects may I notice from receiving this medication? Side effects that you should report to your doctor or health care professional as soon as possible: breathing problems chest pain or palpitations dizziness feeling faint or lightheaded fever or chills skin rash, itching or hives sore throat swelling of the face, lips, or tongue swelling of the legs or ankles unusually weak or tired Side effects that usually do not require medical attention (report to your doctor or health care professional if they continue or are bothersome): diarrhea hair loss  nausea, vomiting tiredness This list may not describe all possible side effects. Call your doctor for medical advice about side effects. You may report side effects to FDA at 1-800-FDA-1088. Where should I keep my medication? This drug is  given in a hospital or clinic and will not be stored at home. NOTE: This sheet is a summary. It may not cover all possible information. If you have questions about this medicine, talk to your doctor, pharmacist, or health care provider.  2022 Elsevier/Gold Standard (2015-06-07 00:00:00) Trastuzumab injection for infusion What is this medication? TRASTUZUMAB (tras TOO zoo mab) is a monoclonal antibody. It is used to treat breast cancer and stomach cancer. This medicine may be used for other purposes; ask your health care provider or pharmacist if you have questions. COMMON BRAND NAME(S): Herceptin, Janae Bridgeman, Ontruzant, Trazimera What should I tell my care team before I take this medication? They need to know if you have any of these conditions: heart disease heart failure lung or breathing disease, like asthma an unusual or allergic reaction to trastuzumab, benzyl alcohol, or other medications, foods, dyes, or preservatives pregnant or trying to get pregnant breast-feeding How should I use this medication? This drug is given as an infusion into a vein. It is administered in a hospital or clinic by a specially trained health care professional. Talk to your pediatrician regarding the use of this medicine in children. This medicine is not approved for use in children. Overdosage: If you think you have taken too much of this medicine contact a poison control center or emergency room at once. NOTE: This medicine is only for you. Do not share this medicine with others. What if I miss a dose? It is important not to miss a dose. Call your doctor or health care professional if you are unable to keep an appointment. What may interact with this medication? This medicine may interact with the following medications: certain types of chemotherapy, such as daunorubicin, doxorubicin, epirubicin, and idarubicin This list may not describe all possible interactions. Give your health care  provider a list of all the medicines, herbs, non-prescription drugs, or dietary supplements you use. Also tell them if you smoke, drink alcohol, or use illegal drugs. Some items may interact with your medicine. What should I watch for while using this medication? Visit your doctor for checks on your progress. Report any side effects. Continue your course of treatment even though you feel ill unless your doctor tells you to stop. Call your doctor or health care professional for advice if you get a fever, chills or sore throat, or other symptoms of a cold or flu. Do not treat yourself. Try to avoid being around people who are sick. You may experience fever, chills and shaking during your first infusion. These effects are usually mild and can be treated with other medicines. Report any side effects during the infusion to your health care professional. Fever and chills usually do not happen with later infusions. Do not become pregnant while taking this medicine or for 7 months after stopping it. Women should inform their doctor if they wish to become pregnant or think they might be pregnant. Women of child-bearing potential will need to have a negative pregnancy test before starting this medicine. There is a potential for serious side effects to an unborn child. Talk to your health care professional or pharmacist for more information. Do not breast-feed an infant while taking this medicine or for 7 months after stopping it. Women must use  effective birth control with this medicine. What side effects may I notice from receiving this medication? Side effects that you should report to your doctor or health care professional as soon as possible: allergic reactions like skin rash, itching or hives, swelling of the face, lips, or tongue chest pain or palpitations cough dizziness feeling faint or lightheaded, falls fever general ill feeling or flu-like symptoms signs of worsening heart failure like breathing  problems; swelling in your legs and feet unusually weak or tired Side effects that usually do not require medical attention (report to your doctor or health care professional if they continue or are bothersome): bone pain changes in taste diarrhea joint pain nausea/vomiting weight loss This list may not describe all possible side effects. Call your doctor for medical advice about side effects. You may report side effects to FDA at 1-800-FDA-1088. Where should I keep my medication? This drug is given in a hospital or clinic and will not be stored at home. NOTE: This sheet is a summary. It may not cover all possible information. If you have questions about this medicine, talk to your doctor, pharmacist, or health care provider.  2022 Elsevier/Gold Standard (2016-05-20 00:00:00)

## 2021-06-14 ENCOUNTER — Encounter: Payer: Self-pay | Admitting: Oncology

## 2021-06-19 ENCOUNTER — Encounter: Payer: Self-pay | Admitting: Oncology

## 2021-06-19 ENCOUNTER — Other Ambulatory Visit: Payer: Self-pay | Admitting: Oncology

## 2021-06-24 NOTE — Progress Notes (Incomplete)
[facility]  [address] [place],    [address] [phone]  Clinic Day:  06/24/2021  Referring physician: [name], FNP  This document serves as a record of services personally performed by Tammy Poisson, MD. It was created on their behalf by Curry,Lauren E, a trained medical scribe. The creation of this record is based on the scribe's personal observations and the provider's statements to them.  CHIEF COMPLAINT:  CC:  History of hormone and HER2/neu receptor positive breast cancer   Current Treatment:   Adjuvant HER2 targeted therapy with trastuzumab/pertuzumab  HISTORY OF PRESENT ILLNESS:  Tammy Fowler is a 71 y.o. 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 Excisional biopsy revealed a 2 cm, grade 2, infiltrating ductal carcinoma in the left lower quadrant.  Estrogen and progesterone receptors were positive and her 2 Neu negative.  Tumor was present at the margins, so she underwent re-excision and axillary dissection.  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  Four of sixteen lymph nodes were positive for metastasis.  All surgical margins were free of tumor involvement.  She received adjuvant chemotherapy with with dose dense Adriamycin and Cytoxan for 4 cycles, followed by dose dense Taxol for 4 cycles, follwed by adjuvant radiation of the left breast.  She was placed on anastrozole 1 mg daily in April 2005 and completed 10 years of therapy in April 2015.  She underwent left latissimus dorsi muscle reconstruction of the left breast and right breast reduction.  She underwent total abdominal hysterectomy and bilateral salpingo-oophorectomy prior to starting anastrozole.  She had excision of a benign mass from the left breast in 2009.  She underwent testing for BRCA mutations, including BART, in April 2013,  which was negative, however, we did not have the results.  She has a history of osteoporosis, previously treated with zolendronic acid every 6 months, which was discontinued when she completed anastrozole.     We began seeing her in July 2014, when she relocated to this area.  Bone density scan in December 2015 was normal.  Her personal and family history wass suggestive of a hereditary cancer syndrome, so we recommended additional genetic testing with the [employer] myRisk Update Panel test to look for a mutation in other genes known to increase the risk for breast cancer and as well as other cancers.  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  These results were reviewed with her in detail and she was scheduled for an breast MRI which did not reveal any evidence of malignancy. Bone density in January 2019 was normal.  Breast MRI in August 2019 did not reveal any evidence of malignancy  She developed diabetes and had an MRI abdomen since pancreatic cancer is increased with the ATM mutation, which was negative.  She  had colonoscopy in early 2020 by Dr. Marcell Anger in [place] and had 2 polyps removed. Repeat colonoscopy in 5 years was recommended. Bilateral screening mammogram in March 2020 and April 2021 did not reveal any evidence of malignancy.   She was seen in January 2022 with a new palpable mass in the left breast.  Diagnostic left mammogram revealed a suspicious mass measuring 2.0 cm at 10  o'clock. Diagnostic right mammogram did not reveal any evidence of malignancy.  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  Estrogen receptor was 30% positive and progesterone receptor negative and HER2 receptor positive.  Ki67 was 25%.   However, there was no breast tissue in the sample, so this was felt to be more likely a recurrence rather than a new breast primary, even though it has been many years. But her previous cancer was HER2 negative and ER positive.    She started treatment with [facility] on February 15th.  She experienced decreased appetite, taste changes, mouth sores, and diarrhea despite imodium, and was treated with supportive care. She did have transient weight loss and hypomagnesemia. MRI brain did not reveal any intracranial metastasis.  She was given IV fluids and placed on lorazepam 0.5 mg every 6 hours as needed for nausea, vomiting and anxiety.  She was also placed on dronabinol 5 mg at bedtime for nausea, vomiting and appetite.  At her visit on May 6th, her side effects were fairly well controlled and she had gained 5 lb  She proceeded with a 5th cycle of TCHP at 25% dose reduction on May 11th.  She completed 6 cycles by July. She underwent bilateral mastectomy and reconstruction on July 26th with Dr. Donne Hazel and Dr. Claudia Desanctis.  Final pathology revealed residual carcinoma of the left breast status post neoadjuvant therapy, measuring 1.5 cm.  Margins uninvolved by carcinoma.  In the right breast, no malignancy was identified. After her last dose of Herceptin/Perjeta she had severe bilateral knee pain which persisted and she was given a prescription for Mobic.  INTERVAL HISTORY:  Tammy Fowler is here for routine follow up after undergoing bilateral breast reconstruction on December 13th. She received 3 cycle of maintenance trastuzumab/pertuzumab prior to her procedure. She complains that she had severe pain of the knees limiting her ability to ambulate. She states that she is doing well since her procedure. She continues to have arthralgias of the bilateral knees, and she has received prior cortisone injections. Hemoglobin has decreased from 12.4 to 11.6, and white count and platelets are normal. Chemistries are unremarkable except for a  BUN of 28 and a fasting blood glucose of 174. Her  appetite is good, and her weight is stable since her last visit.  She denies fever, chills or other signs of infection.  She denies nausea, vomiting, bowel issues, or abdominal pain.  She denies sore throat, cough, dyspnea, or chest pain.  Miu is here for routine follow up prior to a 5th cycle of trastuzumab/pertuzumab.   Her  appetite is good, and she has gained/lost _ pounds since her last visit.  She denies fever, chills or other signs of infection.  She denies nausea, vomiting, bowel issues, or abdominal pain.  She denies sore throat, cough, dyspnea, or chest pain.  REVIEW OF SYSTEMS:  Review of Systems  Constitutional: Negative.  Negative for appetite change, chills, fatigue, fever and unexpected weight change.  HENT:  Negative.    Eyes: Negative.   Respiratory: Negative.  Negative for chest tightness, cough, hemoptysis, shortness of breath and wheezing.   Cardiovascular: Negative.  Negative for chest pain, leg swelling and palpitations.  Gastrointestinal: Negative.  Negative for abdominal distention, abdominal pain, blood in stool, constipation, diarrhea, nausea and vomiting.  Endocrine: Negative.   Genitourinary: Negative.  Negative for difficulty urinating, dysuria, frequency and hematuria.   Musculoskeletal:  Positive for arthralgias (  of the bilateral knees). Negative for back pain, flank pain, gait problem and myalgias.  Skin: Negative.   Neurological: Negative.  Negative for dizziness, extremity weakness, gait problem, headaches, light-headedness, numbness, seizures and speech difficulty.  Hematological: Negative.   Psychiatric/Behavioral: Negative.  Negative for depression and sleep disturbance. The patient is not nervous/anxious.     VITALS:  There were no vitals taken for this visit.    Wt Readings from Last 3 Encounters:  06/10/21 149 lb (67.6 kg)  06/07/21 151 lb 12.8 oz (68.9 kg)  04/30/21 146 lb 9.7 oz (66.5 kg)     There is no height or weight on file to calculate BMI.  Performance status (ECOG): 1 - Symptomatic but completely ambulatory  PHYSICAL EXAM:  Physical Exam Constitutional:      General: She is not in acute distress.    Appearance: Normal appearance. She is normal weight.  HENT:     Head: Normocephalic and atraumatic.  Eyes:     General: No scleral icterus.    Extraocular Movements: Extraocular movements intact.     Conjunctiva/sclera: Conjunctivae normal.     Pupils: Pupils are equal, round, and reactive to light.  Cardiovascular:     Rate and Rhythm: Normal rate and regular rhythm.     Pulses: Normal pulses.     Heart sounds: Normal heart sounds. No murmur heard.   No friction rub. No gallop.  Pulmonary:     Effort: Pulmonary effort is normal. No respiratory distress.     Breath sounds: Normal breath sounds.  Chest:     Comments: Bilateral reconstructions are negative with no signs of problems and are healing well. Abdominal:     General: Bowel sounds are normal. There is no distension.     Palpations: Abdomen is soft. There is no hepatomegaly, splenomegaly or mass.     Tenderness: There is no abdominal tenderness.  Musculoskeletal:        General: Normal range of motion.     Cervical back: Normal range of motion and neck supple.     Right lower leg: No edema.     Left lower leg: No edema.  Lymphadenopathy:     Cervical: No cervical adenopathy.  Skin:    General: Skin is warm and dry.  Neurological:     General: No focal deficit present.     Mental Status: She is alert and oriented to person, place, and time. Mental status is at baseline.  Psychiatric:        Mood and Affect: Mood normal.        Behavior: Behavior normal.        Thought Content: Thought content normal.        Judgment: Judgment normal.   LABS:   CBC Latest Ref Rng & Units 06/07/2021 02/26/2021 02/05/2021  WBC - 5.1 7.4 4.4  Hemoglobin 12.0 - 16.0 11.6(A) 12.4 11.5(A)  Hematocrit 36 - 46 36 37  35(A)  Platelets 150 - 399 224 261 230   CMP Latest Ref Rng & Units 06/07/2021 04/25/2021 02/26/2021  Glucose 70 - 99 mg/dL - 199(H) -  BUN 4 - 21 28(A) 21 31(A)  Creatinine 0.5 - 1.1 1.0 0.69 1.0  Sodium 137 - 147 142 139 138  Potassium 3.4 - 5.3 4.5 5.6(H) 5.7(A)  Chloride 99 - 108 109(A) 101 103  CO2 13 - 22 27(A) 29 27(A)  Calcium 8.7 - 10.7 10.1 10.2 9.8  Alkaline Phos 25 - 125 80 - 75  AST  13 - 35 30 - 30  ALT 7 - 35 27 - 25    STUDIES:  No results found.    HISTORY:   Allergies:  Allergies  Allergen Reactions   Oxycodone Itching   Codeine Other (See Comments)    Other reaction(s): Other (See Comments) Unknown Unknown    Lisinopril     Other reaction(s): Cough (ALLERGY/intolerance)    Current Medications: Current Outpatient Medications  Medication Sig Dispense Refill   ACCU-CHEK GUIDE test strip daily. for testing as directed     aspirin 81 MG chewable tablet Chew by mouth.     benzonatate (TESSALON) 100 MG capsule      calcium-vitamin D 250-100 MG-UNIT tablet Take by mouth.     diphenoxylate-atropine (LOMOTIL) 2.5-0.025 MG tablet Take 2 tablets by mouth 4 (four) times daily as needed for diarrhea or loose stools. 60 tablet 3   Docusate Sodium (DSS) 100 MG CAPS Take by mouth.     DULoxetine (CYMBALTA) 60 MG capsule Take 1 capsule by mouth daily.     FLUoxetine (PROZAC) 40 MG capsule Take by mouth.     gabapentin (NEURONTIN) 300 MG capsule TAKE 1 CAPSULE BY MOUTH THREE TIMES A DAY 90 capsule 0   HYDROcodone-acetaminophen (NORCO) 7.5-325 MG tablet TAKE 1 TABLET BY MOUTH EVERY 4 HOURS AS NEEDED FOR MODERATE PAIN 120 tablet 0   LORazepam (ATIVAN) 0.5 MG tablet Take 1 tablet (0.5 mg total) by mouth every 6 (six) hours as needed for anxiety. 90 tablet 0   losartan (COZAAR) 50 MG tablet daily.     magnesium oxide (MAG-OX) 400 (241.3 Mg) MG tablet Take 1 tablet (400 mg total) by mouth 2 (two) times daily. 60 tablet 1   meclizine (ANTIVERT) 25 MG tablet Take 1 tablet  (25 mg total) by mouth 3 (three) times daily as needed for dizziness. 30 tablet 0   meloxicam (MOBIC) 15 MG tablet Take 1 tablet (15 mg total) by mouth daily. 30 tablet 5   metFORMIN (GLUCOPHAGE) 500 MG tablet 2 (two) times daily.     Multiple Vitamins-Minerals (THERA-M) TABS Take by mouth.     ondansetron (ZOFRAN) 4 MG tablet Take 1 tablet (4 mg total) by mouth every 8 (eight) hours as needed for nausea or vomiting. 20 tablet 0   prochlorperazine (COMPAZINE) 10 MG tablet Take 1 tablet (10 mg total) by mouth every 6 (six) hours as needed (Nausea or vomiting). 30 tablet 1   rosuvastatin (CRESTOR) 40 MG tablet daily.     traMADol (ULTRAM) 50 MG tablet Take 1-2 tablets (50-100 mg total) by mouth every 6 (six) hours as needed. 60 tablet 0   vitamin B-12 (CYANOCOBALAMIN) 500 MCG tablet Take 500 mcg by mouth daily.     Vitamin D, Ergocalciferol, (DRISDOL) 1.25 MG (50000 UNIT) CAPS capsule Take 50,000 Units by mouth 2 (two) times a week.     No current facility-administered medications for this visit.   Facility-Administered Medications Ordered in Other Visits  Medication Dose Route Frequency Provider Last Rate Last Admin   acetaminophen (TYLENOL) tablet 650 mg  650 mg Oral Once [name], MD       diphenhydrAMINE (BENADRYL) capsule 25 mg  25 mg Oral Once [name], MD       sodium chloride flush (NS) 0.9 % injection 10 mL  10 mL Intracatheter PRN [name], MD   10 mL at 01/14/21 1624     ASSESSMENT & PLAN:   Assessment:  1.  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 This is felt to most likely represent a recurrence rather than a 2nd breast primary.  However, this is now HER 2 positive in addition to ER positive.  She completed 6 cycles of neoadjuvant TCHP in  July.  After 4 cycles, the doses were reduced by 25%, but she still was experiencing adverse effects. She completed chemotherapy. She received three cycles of HER2 targeted therapy prior to reconstructive surgery. She is due for 11 postop doses and is willing to resume the Herceptin/Perjeta.    2.  Significant arthralgias, especially of the knees.  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 This is better controlled with cortisone injections and Mobic 15 mg daily.    3.  Mild anemia, most likely due to surgical blood loss.  4.  ATM gene mutation.  She has had bilateral mastectomies.  She is also at increased risk for pancreatic cancer but there are no standardized screening methods.  Plan:     She will proceed with a 5th cycle of maintenance trastuzumab/pertuzumab on Monday. We will see her back in 3 weeks with CBC and CMP prior to a 6th cycle.  We will repeat ECHO in March/April. She verbalizes understanding of and agreement to the plans discussed today. She knows to call the office should any new questions or concerns arise.    I, Rita Ohara, am acting as scribe for [name], MD  I have reviewed this report as typed by the medical scribe, and it is complete and accurate.  Tammy Poisson MD [facility]

## 2021-06-25 DIAGNOSIS — E785 Hyperlipidemia, unspecified: Secondary | ICD-10-CM | POA: Insufficient documentation

## 2021-06-25 DIAGNOSIS — F32A Depression, unspecified: Secondary | ICD-10-CM | POA: Insufficient documentation

## 2021-06-25 DIAGNOSIS — G4733 Obstructive sleep apnea (adult) (pediatric): Secondary | ICD-10-CM | POA: Insufficient documentation

## 2021-06-25 DIAGNOSIS — H547 Unspecified visual loss: Secondary | ICD-10-CM | POA: Insufficient documentation

## 2021-06-25 DIAGNOSIS — Z9989 Dependence on other enabling machines and devices: Secondary | ICD-10-CM | POA: Insufficient documentation

## 2021-06-28 ENCOUNTER — Other Ambulatory Visit: Payer: Self-pay

## 2021-06-28 ENCOUNTER — Encounter: Payer: Self-pay | Admitting: Oncology

## 2021-06-28 ENCOUNTER — Inpatient Hospital Stay (INDEPENDENT_AMBULATORY_CARE_PROVIDER_SITE_OTHER): Payer: Medicare PPO | Admitting: Oncology

## 2021-06-28 ENCOUNTER — Inpatient Hospital Stay: Payer: Medicare PPO | Attending: Oncology

## 2021-06-28 ENCOUNTER — Other Ambulatory Visit: Payer: Medicare PPO

## 2021-06-28 ENCOUNTER — Other Ambulatory Visit: Payer: Self-pay | Admitting: Hematology and Oncology

## 2021-06-28 ENCOUNTER — Ambulatory Visit: Payer: Medicare PPO | Admitting: Oncology

## 2021-06-28 VITALS — BP 131/67 | HR 78 | Temp 98.3°F | Resp 18 | Ht 62.0 in | Wt 148.7 lb

## 2021-06-28 DIAGNOSIS — E875 Hyperkalemia: Secondary | ICD-10-CM | POA: Insufficient documentation

## 2021-06-28 DIAGNOSIS — Z170 Estrogen receptor positive status [ER+]: Secondary | ICD-10-CM | POA: Diagnosis not present

## 2021-06-28 DIAGNOSIS — Z1509 Genetic susceptibility to other malignant neoplasm: Secondary | ICD-10-CM | POA: Insufficient documentation

## 2021-06-28 DIAGNOSIS — Z9079 Acquired absence of other genital organ(s): Secondary | ICD-10-CM | POA: Insufficient documentation

## 2021-06-28 DIAGNOSIS — M810 Age-related osteoporosis without current pathological fracture: Secondary | ICD-10-CM | POA: Diagnosis not present

## 2021-06-28 DIAGNOSIS — C50012 Malignant neoplasm of nipple and areola, left female breast: Secondary | ICD-10-CM | POA: Diagnosis not present

## 2021-06-28 DIAGNOSIS — Z9013 Acquired absence of bilateral breasts and nipples: Secondary | ICD-10-CM | POA: Insufficient documentation

## 2021-06-28 DIAGNOSIS — D649 Anemia, unspecified: Secondary | ICD-10-CM | POA: Insufficient documentation

## 2021-06-28 DIAGNOSIS — N6019 Diffuse cystic mastopathy of unspecified breast: Secondary | ICD-10-CM | POA: Insufficient documentation

## 2021-06-28 DIAGNOSIS — C50919 Malignant neoplasm of unspecified site of unspecified female breast: Secondary | ICD-10-CM

## 2021-06-28 DIAGNOSIS — C50011 Malignant neoplasm of nipple and areola, right female breast: Secondary | ICD-10-CM | POA: Diagnosis not present

## 2021-06-28 DIAGNOSIS — M1990 Unspecified osteoarthritis, unspecified site: Secondary | ICD-10-CM | POA: Insufficient documentation

## 2021-06-28 DIAGNOSIS — Z01818 Encounter for other preprocedural examination: Secondary | ICD-10-CM | POA: Insufficient documentation

## 2021-06-28 DIAGNOSIS — C50212 Malignant neoplasm of upper-inner quadrant of left female breast: Secondary | ICD-10-CM | POA: Insufficient documentation

## 2021-06-28 DIAGNOSIS — N920 Excessive and frequent menstruation with regular cycle: Secondary | ICD-10-CM | POA: Insufficient documentation

## 2021-06-28 DIAGNOSIS — Z79899 Other long term (current) drug therapy: Secondary | ICD-10-CM | POA: Insufficient documentation

## 2021-06-28 DIAGNOSIS — R21 Rash and other nonspecific skin eruption: Secondary | ICD-10-CM | POA: Insufficient documentation

## 2021-06-28 DIAGNOSIS — R69 Illness, unspecified: Secondary | ICD-10-CM | POA: Insufficient documentation

## 2021-06-28 DIAGNOSIS — Z7984 Long term (current) use of oral hypoglycemic drugs: Secondary | ICD-10-CM | POA: Insufficient documentation

## 2021-06-28 DIAGNOSIS — Z79811 Long term (current) use of aromatase inhibitors: Secondary | ICD-10-CM | POA: Insufficient documentation

## 2021-06-28 DIAGNOSIS — Z1501 Genetic susceptibility to malignant neoplasm of breast: Secondary | ICD-10-CM | POA: Insufficient documentation

## 2021-06-28 DIAGNOSIS — G629 Polyneuropathy, unspecified: Secondary | ICD-10-CM | POA: Insufficient documentation

## 2021-06-28 DIAGNOSIS — Z5112 Encounter for antineoplastic immunotherapy: Secondary | ICD-10-CM | POA: Insufficient documentation

## 2021-06-28 DIAGNOSIS — Z7189 Other specified counseling: Secondary | ICD-10-CM | POA: Insufficient documentation

## 2021-06-28 LAB — COMPREHENSIVE METABOLIC PANEL
Albumin: 4.4 (ref 3.5–5.0)
Calcium: 9.9 (ref 8.7–10.7)

## 2021-06-28 LAB — BASIC METABOLIC PANEL
BUN: 24 — AB (ref 4–21)
CO2: 29 — AB (ref 13–22)
Chloride: 105 (ref 99–108)
Creatinine: 0.7 (ref 0.5–1.1)
Glucose: 139
Potassium: 5.2 (ref 3.4–5.3)
Sodium: 139 (ref 137–147)

## 2021-06-28 LAB — CBC AND DIFFERENTIAL
HCT: 35 — AB (ref 36–46)
Hemoglobin: 11.7 — AB (ref 12.0–16.0)
Neutrophils Absolute: 4.23
Platelets: 230 (ref 150–399)
WBC: 5.8

## 2021-06-28 LAB — HEPATIC FUNCTION PANEL
ALT: 36 — AB (ref 7–35)
AST: 32 (ref 13–35)
Alkaline Phosphatase: 72 (ref 25–125)
Bilirubin, Total: 0.5

## 2021-06-28 LAB — CBC: RBC: 3.52 — AB (ref 3.87–5.11)

## 2021-06-28 MED FILL — Trastuzumab-anns For IV Soln 150 MG: INTRAVENOUS | Qty: 20 | Status: AC

## 2021-06-28 MED FILL — Pertuzumab Soln for IV Infusion 420 MG/14ML (30 MG/ML): INTRAVENOUS | Qty: 14 | Status: AC

## 2021-06-28 NOTE — Progress Notes (Signed)
[facility]  [address] [place],  [place]  [address] [phone]  Clinic Day:  06/28/2021  Referring physician: [name], FNP  This document serves as a record of services personally performed by [name], MD. It was created on their behalf by Curry,Lauren E, a trained medical scribe. The creation of this record is based on the scribe's personal observations and the provider's statements to them.  CHIEF COMPLAINT:  CC:  History of hormone and HER2/neu receptor positive breast cancer   Current Treatment:   Adjuvant HER2 targeted therapy with trastuzumab/pertuzumab  HISTORY OF PRESENT ILLNESS:  Tammy Fowler is a 71 y.o. 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 Excisional biopsy revealed a 2 cm, grade 2, infiltrating ductal carcinoma in the left lower quadrant.  Estrogen and progesterone receptors were positive and her 2 Neu negative.  Tumor was present at the margins, so she underwent re-excision and axillary dissection.  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  Four of sixteen lymph nodes were positive for metastasis.  All surgical margins were free of tumor involvement.  She received adjuvant chemotherapy with with dose dense Adriamycin and Cytoxan for 4 cycles, followed by dose dense Taxol for 4 cycles, follwed by adjuvant radiation of the left breast.  She was placed on anastrozole 1 mg daily in April 2005 and completed 10 years of therapy in April 2015.  She underwent left latissimus dorsi muscle reconstruction of the left breast and right breast reduction.  She underwent total abdominal hysterectomy and bilateral salpingo-oophorectomy prior to starting anastrozole.  She had excision of a benign mass from the left breast in 2009.  She underwent testing for BRCA mutations, including BART, in April 2013,  which was negative, however, we did not have the results.  She has a history of osteoporosis, previously treated with zolendronic acid every 6 months, which was discontinued when she completed anastrozole.     We began seeing her in July 2014, when she relocated to this area.  Bone density scan in December 2015 was normal.  Her personal and family history wass suggestive of a hereditary cancer syndrome, so we recommended additional genetic testing with the [employer] myRisk Update Panel test to look for a mutation in other genes known to increase the risk for breast cancer and as well as other cancers.  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  These results were reviewed with her in detail and she was scheduled for an breast MRI which did not reveal any evidence of malignancy. Bone density in January 2019 was normal.  Breast MRI in August 2019 did not reveal any evidence of malignancy  She developed diabetes and had an MRI abdomen since pancreatic cancer is increased with the ATM mutation, which was negative.  She  had colonoscopy in early 2020 by Dr. Marcell Anger in [place] and had 2 polyps removed. Repeat colonoscopy in 5 years was recommended. Bilateral screening mammogram in March 2020 and April 2021 did not reveal any evidence of malignancy.   She was seen in January 2022 with a new palpable mass in the left breast.  Diagnostic left mammogram revealed a suspicious mass measuring 2.0 cm at 10  o'clock. Diagnostic right mammogram did not reveal any evidence of malignancy.  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  Estrogen receptor was 30% positive and progesterone receptor negative and HER2 receptor positive.  Ki67 was 25%.   However, there was no breast tissue in the sample, so this was felt to be more likely a recurrence rather than a new breast primary, even though it has been many years. But her previous cancer was HER2 negative and ER positive.    She started treatment with [facility] on February 15th.  She experienced decreased appetite, taste changes, mouth sores, and diarrhea despite imodium, and was treated with supportive care. She did have transient weight loss and hypomagnesemia. MRI brain did not reveal any intracranial metastasis.  She was given IV fluids and placed on lorazepam 0.5 mg every 6 hours as needed for nausea, vomiting and anxiety.  She was also placed on dronabinol 5 mg at bedtime for nausea, vomiting and appetite.  At her visit on May 6th, her side effects were fairly well controlled and she had gained 5 lb  She proceeded with a 5th cycle of TCHP at 25% dose reduction on May 11th.  She completed 6 cycles by July. She underwent bilateral mastectomy and reconstruction on July 26th with Dr. Donne Hazel and Dr. Claudia Desanctis.  Final pathology revealed residual carcinoma of the left breast status post neoadjuvant therapy, measuring 1.5 cm.  Margins uninvolved by carcinoma.  In the right breast, no malignancy was identified. After her last dose of Herceptin/Perjeta she had severe bilateral knee pain which persisted and she was given a prescription for Mobic. She underwent bilateral breast reconstruction on December 13th.  INTERVAL HISTORY:  Tammy Fowler is here for routine follow up prior to a 5th cycle of trastuzumab. She recently had cortisone injections of both knees through her orthopedist, with improvement in her pain. She asks about pursuing physical therapy, and we will make the appropriate referral. I did advise that she postpone her dental cleaning until she has completed therapy. Hemoglobin is stable at 11.7, and white count and platelets are normal. Chemistries are unremarkable except for a BUN of 24 and a potassium of 5.2.  She has been including oranges in her diet, and I advised that she cut back. Her  appetite is good, and she has lost 3 pounds since her last visit.  She denies fever, chills or other signs of infection.  She denies nausea, vomiting, bowel issues, or abdominal pain.  She denies sore throat, cough, dyspnea, or chest pain.  REVIEW OF SYSTEMS:  Review of Systems  Constitutional: Negative.  Negative for appetite change, chills, fatigue, fever and unexpected weight change.  HENT:  Negative.    Eyes: Negative.   Respiratory: Negative.  Negative for chest tightness, cough, hemoptysis, shortness of breath and wheezing.   Cardiovascular: Negative.  Negative for chest pain, leg swelling and palpitations.  Gastrointestinal: Negative.  Negative for abdominal distention, abdominal pain, blood in stool, constipation, diarrhea, nausea and vomiting.  Endocrine: Negative.   Genitourinary: Negative.  Negative for difficulty urinating, dysuria, frequency and hematuria.   Musculoskeletal:  Positive for arthralgias (of the bilateral knees). Negative for back pain, flank pain, gait problem and myalgias.  Skin: Negative.   Neurological: Negative.  Negative for dizziness, extremity weakness, gait problem, headaches, light-headedness, numbness, seizures and speech difficulty.  Hematological: Negative.   Psychiatric/Behavioral: Negative.  Negative for depression and sleep disturbance. The patient  is not nervous/anxious.     VITALS:  Blood pressure 131/67, pulse 78, temperature 98.3 F (36.8 C), temperature source Oral, resp. rate 18, height 5' 2"  (1.575 m), weight 148 lb 11.2 oz (67.4 kg), SpO2 99 %.    Wt Readings from Last 3 Encounters:  06/28/21 148 lb 11.2 oz (67.4 kg)  06/10/21 149 lb (67.6 kg)  06/07/21 151 lb 12.8 oz (68.9 kg)    Body mass index is 27.2 kg/m.  Performance status (ECOG): 1 - Symptomatic but completely ambulatory  PHYSICAL EXAM:  Physical Exam Constitutional:      General: She is not in  acute distress.    Appearance: Normal appearance. She is normal weight.  HENT:     Head: Normocephalic and atraumatic.  Eyes:     General: No scleral icterus.    Extraocular Movements: Extraocular movements intact.     Conjunctiva/sclera: Conjunctivae normal.     Pupils: Pupils are equal, round, and reactive to light.  Cardiovascular:     Rate and Rhythm: Normal rate and regular rhythm.     Pulses: Normal pulses.     Heart sounds: Normal heart sounds. No murmur heard.   No friction rub. No gallop.  Pulmonary:     Effort: Pulmonary effort is normal. No respiratory distress.     Breath sounds: Normal breath sounds.  Chest:     Comments: Bilateral reconstructions are negative with no signs of problems and are healing well. Abdominal:     General: Bowel sounds are normal. There is no distension.     Palpations: Abdomen is soft. There is no hepatomegaly, splenomegaly or mass.     Tenderness: There is no abdominal tenderness.  Musculoskeletal:        General: Normal range of motion.     Cervical back: Normal range of motion and neck supple.     Right lower leg: No edema.     Left lower leg: No edema.  Lymphadenopathy:     Cervical: No cervical adenopathy.  Skin:    General: Skin is warm and dry.     Comments: Small ulcerations of the right shoulder/neck and one healing in the right lower back area.  Neurological:     General: No focal deficit present.     Mental Status: She is alert and oriented to person, place, and time. Mental status is at baseline.  Psychiatric:        Mood and Affect: Mood normal.        Behavior: Behavior normal.        Thought Content: Thought content normal.        Judgment: Judgment normal.   LABS:   CBC Latest Ref Rng & Units 06/28/2021 06/07/2021 02/26/2021  WBC - 5.8 5.1 7.4  Hemoglobin 12.0 - 16.0 11.7(A) 11.6(A) 12.4  Hematocrit 36 - 46 35(A) 36 37  Platelets 150 - 399 230 224 261   CMP Latest Ref Rng & Units 06/28/2021 06/07/2021 04/25/2021   Glucose 70 - 99 mg/dL - - 199(H)  BUN 4 - 21 24(A) 28(A) 21  Creatinine 0.5 - 1.1 0.7 1.0 0.69  Sodium 137 - 147 139 142 139  Potassium 3.4 - 5.3 5.2 4.5 5.6(H)  Chloride 99 - 108 105 109(A) 101  CO2 13 - 22 29(A) 27(A) 29  Calcium 8.7 - 10.7 9.9 10.1 10.2  Alkaline Phos 25 - 125 72 80 -  AST 13 - 35 32 30 -  ALT 7 - 35 36(A) 27 -  STUDIES:  No results found.    HISTORY:   Allergies:  Allergies  Allergen Reactions   Oxycodone Itching    Other reaction(s): Itching   Codeine Other (See Comments)    Other reaction(s): Other (See Comments) Unknown Unknown  Other reaction(s): Other (See Comments), Other (See Comments), Other (See Comments) Unknown Unknown Other reaction(s): Other (See Comments) Unknown Unknown   Lisinopril     Other reaction(s): Cough (ALLERGY/intolerance) Other reaction(s): Cough Other reaction(s): Cough (ALLERGY/intolerance)    Current Medications: Current Outpatient Medications  Medication Sig Dispense Refill   celecoxib (CELEBREX) 100 MG capsule Take by mouth.     ACCU-CHEK GUIDE test strip daily. for testing as directed     aspirin 81 MG chewable tablet Chew by mouth.     benzonatate (TESSALON) 100 MG capsule      calcium-vitamin D 250-100 MG-UNIT tablet Take by mouth.     diphenoxylate-atropine (LOMOTIL) 2.5-0.025 MG tablet Take 2 tablets by mouth 4 (four) times daily as needed for diarrhea or loose stools. 60 tablet 3   Docusate Sodium (DSS) 100 MG CAPS Take by mouth.     DULoxetine (CYMBALTA) 60 MG capsule Take 1 capsule by mouth daily.     FLUoxetine (PROZAC) 40 MG capsule Take by mouth.     gabapentin (NEURONTIN) 300 MG capsule TAKE 1 CAPSULE BY MOUTH THREE TIMES A DAY 90 capsule 0   HYDROcodone-acetaminophen (NORCO) 7.5-325 MG tablet TAKE 1 TABLET BY MOUTH EVERY 4 HOURS AS NEEDED FOR MODERATE PAIN 120 tablet 0   LORazepam (ATIVAN) 0.5 MG tablet Take 1 tablet (0.5 mg total) by mouth every 6 (six) hours as needed for anxiety. 90 tablet 0    losartan (COZAAR) 50 MG tablet daily.     magnesium oxide (MAG-OX) 400 (241.3 Mg) MG tablet Take 1 tablet (400 mg total) by mouth 2 (two) times daily. 60 tablet 1   meclizine (ANTIVERT) 25 MG tablet Take 1 tablet (25 mg total) by mouth 3 (three) times daily as needed for dizziness. 30 tablet 0   meloxicam (MOBIC) 15 MG tablet Take 1 tablet (15 mg total) by mouth daily. 30 tablet 5   metFORMIN (GLUCOPHAGE) 500 MG tablet 2 (two) times daily.     methocarbamol (ROBAXIN) 500 MG tablet Take 500 mg by mouth 3 (three) times daily.     Multiple Vitamins-Minerals (THERA-M) TABS Take by mouth.     ondansetron (ZOFRAN) 4 MG tablet Take 1 tablet (4 mg total) by mouth every 8 (eight) hours as needed for nausea or vomiting. 20 tablet 0   prochlorperazine (COMPAZINE) 10 MG tablet Take 1 tablet (10 mg total) by mouth every 6 (six) hours as needed (Nausea or vomiting). 30 tablet 1   rosuvastatin (CRESTOR) 40 MG tablet daily.     traMADol (ULTRAM) 50 MG tablet Take 1-2 tablets (50-100 mg total) by mouth every 6 (six) hours as needed. 60 tablet 0   vitamin B-12 (CYANOCOBALAMIN) 500 MCG tablet Take 500 mcg by mouth daily.     Vitamin D, Ergocalciferol, (DRISDOL) 1.25 MG (50000 UNIT) CAPS capsule Take 50,000 Units by mouth 2 (two) times a week.     No current facility-administered medications for this visit.   Facility-Administered Medications Ordered in Other Visits  Medication Dose Route Frequency Provider Last Rate Last Admin   acetaminophen (TYLENOL) tablet 650 mg  650 mg Oral Once [name], MD       diphenhydrAMINE (BENADRYL) capsule 25 mg  25 mg Oral Once [place],  Chinita Pester, MD       sodium chloride flush (NS) 0.9 % injection 10 mL  10 mL Intracatheter PRN [name], MD   10 mL at 01/14/21 1624     ASSESSMENT & PLAN:   Assessment:  1.  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 This is felt to most likely represent a recurrence rather than a 2nd breast primary.  However, this is now HER 2 positive in addition to ER positive.  She completed 6 cycles of neoadjuvant TCHP in July.  After 4 cycles, the doses were reduced by 25%, but she still was experiencing adverse effects. She completed chemotherapy. She received three cycles of HER2 targeted therapy prior to reconstructive surgery. She is due for 11 postop doses and has resumed the Herceptin/Perjeta.    2.  Significant arthralgias, especially of the knees.  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 This is better controlled with cortisone injections and Mobic 15 mg daily. She asks about referral to physical therapy and we gave her a list of facilities to choose from. The orthopedic surgeon has explained to her that this this not caused by her current treatments.  3.  Mild anemia, we will continue to monitor.  4.  ATM gene mutation.  She has had bilateral mastectomies.  She is also at increased risk for pancreatic cancer but there are no standardized screening methods.  5.  Hyperkalemia, mild. I did advise that she cut back on oranges as she has been eating many lately.  Plan:     She will proceed with a 5th cycle of maintenance trastuzumab/pertuzumab on Monday. We will see her back in 3 weeks with CBC and CMP prior to a 6th cycle.  We will repeat ECHO in March/April. She verbalizes understanding of and agreement to the plans discussed today. She knows to call the office should any new questions or concerns arise.    I, Rita Ohara, am acting as scribe for [name], MD  I have reviewed this report as typed by the medical scribe, and it is complete and accurate.  [name] MD [facility]

## 2021-07-01 ENCOUNTER — Other Ambulatory Visit: Payer: Self-pay

## 2021-07-01 ENCOUNTER — Encounter: Payer: Self-pay | Admitting: Oncology

## 2021-07-01 ENCOUNTER — Inpatient Hospital Stay: Payer: Medicare PPO

## 2021-07-01 VITALS — BP 133/65 | HR 97 | Temp 96.1°F | Resp 20 | Wt 147.0 lb

## 2021-07-01 DIAGNOSIS — Z79811 Long term (current) use of aromatase inhibitors: Secondary | ICD-10-CM | POA: Diagnosis not present

## 2021-07-01 DIAGNOSIS — Z170 Estrogen receptor positive status [ER+]: Secondary | ICD-10-CM | POA: Diagnosis not present

## 2021-07-01 DIAGNOSIS — Z1501 Genetic susceptibility to malignant neoplasm of breast: Secondary | ICD-10-CM | POA: Diagnosis not present

## 2021-07-01 DIAGNOSIS — Z7984 Long term (current) use of oral hypoglycemic drugs: Secondary | ICD-10-CM | POA: Diagnosis not present

## 2021-07-01 DIAGNOSIS — Z1509 Genetic susceptibility to other malignant neoplasm: Secondary | ICD-10-CM | POA: Diagnosis not present

## 2021-07-01 DIAGNOSIS — Z9013 Acquired absence of bilateral breasts and nipples: Secondary | ICD-10-CM | POA: Diagnosis not present

## 2021-07-01 DIAGNOSIS — C50912 Malignant neoplasm of unspecified site of left female breast: Secondary | ICD-10-CM

## 2021-07-01 DIAGNOSIS — D649 Anemia, unspecified: Secondary | ICD-10-CM | POA: Diagnosis not present

## 2021-07-01 DIAGNOSIS — C50212 Malignant neoplasm of upper-inner quadrant of left female breast: Secondary | ICD-10-CM | POA: Diagnosis present

## 2021-07-01 DIAGNOSIS — Z79899 Other long term (current) drug therapy: Secondary | ICD-10-CM | POA: Diagnosis not present

## 2021-07-01 DIAGNOSIS — E875 Hyperkalemia: Secondary | ICD-10-CM | POA: Diagnosis not present

## 2021-07-01 DIAGNOSIS — M1990 Unspecified osteoarthritis, unspecified site: Secondary | ICD-10-CM | POA: Diagnosis not present

## 2021-07-01 DIAGNOSIS — Z5112 Encounter for antineoplastic immunotherapy: Secondary | ICD-10-CM | POA: Diagnosis not present

## 2021-07-01 MED ORDER — HEPARIN SOD (PORK) LOCK FLUSH 100 UNIT/ML IV SOLN
500.0000 [IU] | Freq: Once | INTRAVENOUS | Status: AC | PRN
  Administered 2021-07-01: 500 [IU]

## 2021-07-01 MED ORDER — DIPHENHYDRAMINE HCL 25 MG PO CAPS
25.0000 mg | ORAL_CAPSULE | Freq: Once | ORAL | Status: AC
  Administered 2021-07-01: 25 mg via ORAL
  Filled 2021-07-01: qty 1

## 2021-07-01 MED ORDER — SODIUM CHLORIDE 0.9% FLUSH
10.0000 mL | INTRAVENOUS | Status: DC | PRN
  Administered 2021-07-01: 10 mL

## 2021-07-01 MED ORDER — ACETAMINOPHEN 325 MG PO TABS
650.0000 mg | ORAL_TABLET | Freq: Once | ORAL | Status: AC
  Administered 2021-07-01: 650 mg via ORAL
  Filled 2021-07-01: qty 2

## 2021-07-01 MED ORDER — SODIUM CHLORIDE 0.9 % IV SOLN: Freq: Once | INTRAVENOUS | Status: AC

## 2021-07-01 MED ORDER — SODIUM CHLORIDE 0.9 % IV SOLN
420.0000 mg | Freq: Once | INTRAVENOUS | Status: AC
  Administered 2021-07-01: 420 mg via INTRAVENOUS
  Filled 2021-07-01: qty 14

## 2021-07-01 MED ORDER — TRASTUZUMAB-ANNS CHEMO 150 MG IV SOLR
6.0000 mg/kg | Freq: Once | INTRAVENOUS | Status: AC
  Administered 2021-07-01: 420 mg via INTRAVENOUS
  Filled 2021-07-01: qty 20

## 2021-07-01 NOTE — Patient Instructions (Addendum)
[place]  Discharge Instructions: Thank you for choosing [facility] to provide your oncology and hematology care.  If you have a lab appointment with the [facility], please go directly to the [facility] and check in at the registration area.   Wear comfortable clothing and clothing appropriate for easy access to any Portacath or PICC line.   We strive to give you quality time with your provider. You may need to reschedule your appointment if you arrive late (15 or more minutes).  Arriving late affects you and other patients whose appointments are after yours.  Also, if you miss three or more appointments without notifying the office, you may be dismissed from the clinic at the providers discretion.      For prescription refill requests, have your pharmacy contact our office and allow 72 hours for refills to be completed.    Today you received the following chemotherapy and/or immunotherapy agents: Herceptin, Perjeta      To help prevent nausea and vomiting after your treatment, we encourage you to take your nausea medication as directed.  BELOW ARE SYMPTOMS THAT SHOULD BE REPORTED IMMEDIATELY: *FEVER GREATER THAN 100.4 F (38 C) OR HIGHER *CHILLS OR SWEATING *NAUSEA AND VOMITING THAT IS NOT CONTROLLED WITH YOUR NAUSEA MEDICATION *UNUSUAL SHORTNESS OF BREATH *UNUSUAL BRUISING OR BLEEDING *URINARY PROBLEMS (pain or burning when urinating, or frequent urination) *BOWEL PROBLEMS (unusual diarrhea, constipation, pain near the anus) TENDERNESS IN MOUTH AND THROAT WITH OR WITHOUT PRESENCE OF ULCERS (sore throat, sores in mouth, or a toothache) UNUSUAL RASH, SWELLING OR PAIN  UNUSUAL VAGINAL DISCHARGE OR ITCHING   Items with * indicate a potential emergency and should be followed up as soon as possible or go to the Emergency Department if any problems should occur.  Please show the CHEMOTHERAPY ALERT CARD or IMMUNOTHERAPY ALERT CARD at check-in  to the Emergency Department and triage nurse.  Should you have questions after your visit or need to cancel or reschedule your appointment, please contact [facility]  Dept: [phone]  and follow the prompts.  Office hours are 8:00 a.m. to 4:30 p.m. Monday - Friday. Please note that voicemails left after 4:00 p.m. may not be returned until the following business day.  We are closed weekends and major holidays. You have access to a nurse at all times for urgent questions. Please call the main number to the clinic Dept: [phone] and follow the prompts.  For any non-urgent questions, you may also contact your provider using MyChart. We now offer e-Visits for anyone 50 and older to request care online for non-urgent symptoms. For details visit [email or web address].   Also download the MyChart app! Go to the app store, search "MyChart", open the app, select [facility], and log in with your MyChart username and password.  Due to Covid, a mask is required upon entering the hospital/clinic. If you do not have a mask, one will be given to you upon arrival. For doctor visits, patients may have 1 support person aged 74 or older with them. For treatment visits, patients cannot have anyone with them due to current Covid guidelines and our immunocompromised population.   Pertuzumab injection What is this medication? PERTUZUMAB (per TOOZ ue mab) is a monoclonal antibody. It is used to treat breast cancer. This medicine may be used for other purposes; ask your health care provider or pharmacist if you have questions. COMMON BRAND NAME(S): PERJETA What should I tell my  care team before I take this medication? They need to know if you have any of these conditions: heart disease heart failure high blood pressure history of irregular heart beat recent or ongoing radiation therapy an unusual or allergic reaction to pertuzumab, other medicines, foods, dyes, or  preservatives pregnant or trying to get pregnant breast-feeding How should I use this medication? This medicine is for infusion into a vein. It is given by a health care professional in a hospital or clinic setting. Talk to your pediatrician regarding the use of this medicine in children. Special care may be needed. Overdosage: If you think you have taken too much of this medicine contact a poison control center or emergency room at once. NOTE: This medicine is only for you. Do not share this medicine with others. What if I miss a dose? It is important not to miss your dose. Call your doctor or health care professional if you are unable to keep an appointment. What may interact with this medication? Interactions are not expected. Give your health care provider a list of all the medicines, herbs, non-prescription drugs, or dietary supplements you use. Also tell them if you smoke, drink alcohol, or use illegal drugs. Some items may interact with your medicine. This list may not describe all possible interactions. Give your health care provider a list of all the medicines, herbs, non-prescription drugs, or dietary supplements you use. Also tell them if you smoke, drink alcohol, or use illegal drugs. Some items may interact with your medicine. What should I watch for while using this medication? Your condition will be monitored carefully while you are receiving this medicine. Report any side effects. Continue your course of treatment even though you feel ill unless your doctor tells you to stop. Do not become pregnant while taking this medicine or for 7 months after stopping it. Women should inform their doctor if they wish to become pregnant or think they might be pregnant. Women of child-bearing potential will need to have a negative pregnancy test before starting this medicine. There is a potential for serious side effects to an unborn child. Talk to your health care professional or pharmacist for  more information. Do not breast-feed an infant while taking this medicine or for 7 months after stopping it. Women must use effective birth control with this medicine. Call your doctor or health care professional for advice if you get a fever, chills or sore throat, or other symptoms of a cold or flu. Do not treat yourself. Try to avoid being around people who are sick. You may experience fever, chills, and headache during the infusion. Report any side effects during the infusion to your health care professional. What side effects may I notice from receiving this medication? Side effects that you should report to your doctor or health care professional as soon as possible: breathing problems chest pain or palpitations dizziness feeling faint or lightheaded fever or chills skin rash, itching or hives sore throat swelling of the face, lips, or tongue swelling of the legs or ankles unusually weak or tired Side effects that usually do not require medical attention (report to your doctor or health care professional if they continue or are bothersome): diarrhea hair loss nausea, vomiting tiredness This list may not describe all possible side effects. Call your doctor for medical advice about side effects. You may report side effects to FDA at 1-800-FDA-1088. Where should I keep my medication? This drug is given in a hospital or clinic and will not be  stored at home. NOTE: This sheet is a summary. It may not cover all possible information. If you have questions about this medicine, talk to your doctor, pharmacist, or health care provider.  2022 Elsevier/Gold Standard (2015-06-07 00:00:00) Trastuzumab injection for infusion What is this medication? TRASTUZUMAB (tras TOO zoo mab) is a monoclonal antibody. It is used to treat breast cancer and stomach cancer. This medicine may be used for other purposes; ask your health care provider or pharmacist if you have questions. COMMON BRAND NAME(S):  Herceptin, Janae Bridgeman, Ontruzant, Trazimera What should I tell my care team before I take this medication? They need to know if you have any of these conditions: heart disease heart failure lung or breathing disease, like asthma an unusual or allergic reaction to trastuzumab, benzyl alcohol, or other medications, foods, dyes, or preservatives pregnant or trying to get pregnant breast-feeding How should I use this medication? This drug is given as an infusion into a vein. It is administered in a hospital or clinic by a specially trained health care professional. Talk to your pediatrician regarding the use of this medicine in children. This medicine is not approved for use in children. Overdosage: If you think you have taken too much of this medicine contact a poison control center or emergency room at once. NOTE: This medicine is only for you. Do not share this medicine with others. What if I miss a dose? It is important not to miss a dose. Call your doctor or health care professional if you are unable to keep an appointment. What may interact with this medication? This medicine may interact with the following medications: certain types of chemotherapy, such as daunorubicin, doxorubicin, epirubicin, and idarubicin This list may not describe all possible interactions. Give your health care provider a list of all the medicines, herbs, non-prescription drugs, or dietary supplements you use. Also tell them if you smoke, drink alcohol, or use illegal drugs. Some items may interact with your medicine. What should I watch for while using this medication? Visit your doctor for checks on your progress. Report any side effects. Continue your course of treatment even though you feel ill unless your doctor tells you to stop. Call your doctor or health care professional for advice if you get a fever, chills or sore throat, or other symptoms of a cold or flu. Do not treat yourself. Try to avoid  being around people who are sick. You may experience fever, chills and shaking during your first infusion. These effects are usually mild and can be treated with other medicines. Report any side effects during the infusion to your health care professional. Fever and chills usually do not happen with later infusions. Do not become pregnant while taking this medicine or for 7 months after stopping it. Women should inform their doctor if they wish to become pregnant or think they might be pregnant. Women of child-bearing potential will need to have a negative pregnancy test before starting this medicine. There is a potential for serious side effects to an unborn child. Talk to your health care professional or pharmacist for more information. Do not breast-feed an infant while taking this medicine or for 7 months after stopping it. Women must use effective birth control with this medicine. What side effects may I notice from receiving this medication? Side effects that you should report to your doctor or health care professional as soon as possible: allergic reactions like skin rash, itching or hives, swelling of the face, lips, or tongue chest  pain or palpitations cough dizziness feeling faint or lightheaded, falls fever general ill feeling or flu-like symptoms signs of worsening heart failure like breathing problems; swelling in your legs and feet unusually weak or tired Side effects that usually do not require medical attention (report to your doctor or health care professional if they continue or are bothersome): bone pain changes in taste diarrhea joint pain nausea/vomiting weight loss This list may not describe all possible side effects. Call your doctor for medical advice about side effects. You may report side effects to FDA at 1-800-FDA-1088. Where should I keep my medication? This drug is given in a hospital or clinic and will not be stored at home. NOTE: This sheet is a summary. It  may not cover all possible information. If you have questions about this medicine, talk to your doctor, pharmacist, or health care provider.  2022 Elsevier/Gold Standard (2016-05-20 00:00:00)

## 2021-07-05 ENCOUNTER — Encounter: Payer: Self-pay | Admitting: Oncology

## 2021-07-15 NOTE — Progress Notes (Signed)
[facility]  [address] [place],  [place]  [id number] [phone]  Clinic Day:  07/19/2021  Referring physician: [name], FNP  This document serves as a record of services personally performed by [name], MD. It was created on their behalf by Curry,Lauren E, a trained medical scribe. The creation of this record is based on the scribe's personal observations and the provider's statements to them.  CHIEF COMPLAINT:  CC:  History of hormone and HER2/neu receptor positive breast cancer   Current Treatment:   Adjuvant HER2 targeted therapy with trastuzumab/pertuzumab  HISTORY OF PRESENT ILLNESS:  Tammy Fowler is a 71 y.o. 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 Excisional biopsy revealed a 2 cm, grade 2, infiltrating ductal carcinoma in the left lower quadrant.  Estrogen and progesterone receptors were positive and her 2 Neu negative.  Tumor was present at the margins, so she underwent re-excision and axillary dissection.  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  Four of sixteen lymph nodes were positive for metastasis.  All surgical margins were free of tumor involvement.  She received adjuvant chemotherapy with with dose dense Adriamycin and Cytoxan for 4 cycles, followed by dose dense Taxol for 4 cycles, follwed by adjuvant radiation of the left breast.  She was placed on anastrozole 1 mg daily in April 2005 and completed 10 years of therapy in April 2015.  She underwent left latissimus dorsi muscle reconstruction of the left breast and right breast reduction.  She underwent total abdominal hysterectomy and bilateral salpingo-oophorectomy prior to starting anastrozole.  She had excision of a benign mass from the left breast in 2009.  She underwent testing for BRCA mutations, including BART, in April 2013,  which was negative, however, we did not have the results.  She has a history of osteoporosis, previously treated with zolendronic acid every 6 months, which was discontinued when she completed anastrozole.     We began seeing her in July 2014, when she relocated to this area.  Bone density scan in December 2015 was normal.  Her personal and family history wass suggestive of a hereditary cancer syndrome, so we recommended additional genetic testing with the [employer] myRisk Update Panel test to look for a mutation in other genes known to increase the risk for breast cancer and as well as other cancers.  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  These results were reviewed with her in detail and she was scheduled for an breast MRI which did not reveal any evidence of malignancy. Bone density in January 2019 was normal.  Breast MRI in August 2019 did not reveal any evidence of malignancy  She developed diabetes and had an MRI abdomen since pancreatic cancer is increased with the ATM mutation, which was negative.  She  had colonoscopy in early 2020 by Dr. Marcell Anger in [place] and had 2 polyps removed. Repeat colonoscopy in 5 years was recommended. Bilateral screening mammogram in March 2020 and April 2021 did not reveal any evidence of malignancy.   She was seen in January 2022 with a new palpable mass in the left breast.  Diagnostic left mammogram revealed a suspicious mass measuring 2.0 cm at 10  o'clock. Diagnostic right mammogram did not reveal any evidence of malignancy.  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  Estrogen receptor was 30% positive and progesterone receptor negative and HER2 receptor positive.  Ki67 was 25%.   However, there was no breast tissue in the sample, so this was felt to be more likely a recurrence rather than a new breast primary, even though it has been many years. But her previous cancer was HER2 negative and ER positive.    She started treatment with [facility] on February 15th.  She experienced decreased appetite, taste changes, mouth sores, and diarrhea despite imodium, and was treated with supportive care. She did have transient weight loss and hypomagnesemia. MRI brain did not reveal any intracranial metastasis.  She was given IV fluids and placed on lorazepam 0.5 mg every 6 hours as needed for nausea, vomiting and anxiety.  She was also placed on dronabinol 5 mg at bedtime for nausea, vomiting and appetite.  At her visit on May 6th, her side effects were fairly well controlled and she had gained 5 lb  She proceeded with a 5th cycle of TCHP at 25% dose reduction on May 11th.  She completed 6 cycles by July. She underwent bilateral mastectomy and reconstruction on July 26th with Dr. Donne Hazel and Dr. Claudia Desanctis.  Final pathology revealed residual carcinoma of the left breast status post neoadjuvant therapy, measuring 1.5 cm.  Margins uninvolved by carcinoma.  In the right breast, no malignancy was identified. After her last dose of Herceptin/Perjeta she had severe bilateral knee pain which persisted and she was given a prescription for Mobic. She underwent bilateral breast reconstruction on December 13th.  INTERVAL HISTORY:  Tammy Fowler is here for routine follow up prior to a 6th cycle of trastuzumab/pertuzumab. She states that she has been well and denies complaints today. Blood counts are unremarkable including a hemoglobin of 12.0. Her  appetite is good, and she has lost 1 and 1/2 pounds since her last visit.  She denies fever, chills or other signs of infection.  She denies nausea, vomiting, bowel issues, or abdominal pain.  She denies sore throat, cough, dyspnea, or chest pain.  REVIEW OF SYSTEMS:  Review of  Systems  Constitutional: Negative.  Negative for appetite change, chills, fatigue, fever and unexpected weight change.  HENT:  Negative.    Eyes: Negative.   Respiratory: Negative.  Negative for chest tightness, cough, hemoptysis, shortness of breath and wheezing.   Cardiovascular: Negative.  Negative for chest pain, leg swelling and palpitations.  Gastrointestinal: Negative.  Negative for abdominal distention, abdominal pain, blood in stool, constipation, diarrhea, nausea and vomiting.  Endocrine: Negative.   Genitourinary: Negative.  Negative for difficulty urinating, dysuria, frequency and hematuria.   Musculoskeletal: Negative.  Negative for arthralgias, back pain, flank pain, gait problem and myalgias.  Skin: Negative.   Neurological: Negative.  Negative for dizziness, extremity weakness, gait problem, headaches, light-headedness, numbness, seizures and speech difficulty.  Hematological: Negative.   Psychiatric/Behavioral: Negative.  Negative for depression and sleep disturbance. The patient is not nervous/anxious.     VITALS:  Pulse 91, temperature 98 F (36.7 C), temperature source Oral, resp. rate 16, height 5' 2"  (1.575 m), weight 147 lb 3.2 oz (66.8 kg), SpO2 98 %.    Wt Readings from Last 3 Encounters:  07/19/21 147 lb 3.2 oz (66.8 kg)  07/01/21 147 lb (66.7 kg)  06/28/21 148 lb 11.2 oz (67.4  kg)    Body mass index is 26.92 kg/m.  Performance status (ECOG): 0 - Asymptomatic  PHYSICAL EXAM:  Physical Exam Constitutional:      General: She is not in acute distress.    Appearance: Normal appearance. She is normal weight.  HENT:     Head: Normocephalic and atraumatic.  Eyes:     General: No scleral icterus.    Extraocular Movements: Extraocular movements intact.     Conjunctiva/sclera: Conjunctivae normal.     Pupils: Pupils are equal, round, and reactive to light.  Cardiovascular:     Rate and Rhythm: Normal rate and regular rhythm.     Pulses: Normal pulses.      Heart sounds: Normal heart sounds. No murmur heard.   No friction rub. No gallop.  Pulmonary:     Effort: Pulmonary effort is normal. No respiratory distress.     Breath sounds: Normal breath sounds.  Chest:     Comments: Bilateral reconstruction are negative. She has a tiny nodule in the skin on the right lower breast at the inframammary fold. Abdominal:     General: Bowel sounds are normal. There is no distension.     Palpations: Abdomen is soft. There is no hepatomegaly, splenomegaly or mass.     Tenderness: There is no abdominal tenderness.  Musculoskeletal:        General: Normal range of motion.     Cervical back: Normal range of motion and neck supple.     Right lower leg: No edema.     Left lower leg: No edema.  Lymphadenopathy:     Cervical: No cervical adenopathy.  Skin:    General: Skin is warm and dry.  Neurological:     General: No focal deficit present.     Mental Status: She is alert and oriented to person, place, and time. Mental status is at baseline.  Psychiatric:        Mood and Affect: Mood normal.        Behavior: Behavior normal.        Thought Content: Thought content normal.        Judgment: Judgment normal.   LABS:   CBC Latest Ref Rng & Units 07/19/2021 06/28/2021 06/07/2021  WBC - 5.2 5.8 5.1  Hemoglobin 12.0 - 16.0 12.0 11.7(A) 11.6(A)  Hematocrit 36 - 46 36 35(A) 36  Platelets 150 - 399 235 230 224   CMP Latest Ref Rng & Units 07/19/2021 06/28/2021 06/07/2021  Glucose 70 - 99 mg/dL - - -  BUN 4 - 21 39(A) 24(A) 28(A)  Creatinine 0.5 - 1.1 0.7 0.7 1.0  Sodium 137 - 147 139 139 142  Potassium 3.4 - 5.3 4.5 5.2 4.5  Chloride 99 - 108 105 105 109(A)  CO2 13 - 22 26(A) 29(A) 27(A)  Calcium 8.7 - 10.7 10.2 9.9 10.1  Alkaline Phos 25 - 125 71 72 80  AST 13 - 35 31 32 30  ALT 7 - 35 47(A) 36(A) 27    STUDIES:  No results found.    HISTORY:   Allergies:  Allergies  Allergen Reactions   Oxycodone Itching    Other reaction(s): Itching   Codeine  Other (See Comments)    Other reaction(s): Other (See Comments) Unknown Unknown  Other reaction(s): Other (See Comments), Other (See Comments), Other (See Comments) Unknown Unknown Other reaction(s): Other (See Comments) Unknown Unknown   Lisinopril     Other reaction(s): Cough (ALLERGY/intolerance) Other reaction(s): Cough Other reaction(s): Cough (ALLERGY/intolerance)  Current Medications: Current Outpatient Medications  Medication Sig Dispense Refill   ACCU-CHEK GUIDE test strip daily. for testing as directed     aspirin 81 MG chewable tablet Chew by mouth.     benzonatate (TESSALON) 100 MG capsule      calcium-vitamin D 250-100 MG-UNIT tablet Take by mouth.     celecoxib (CELEBREX) 100 MG capsule Take by mouth.     diphenoxylate-atropine (LOMOTIL) 2.5-0.025 MG tablet Take 2 tablets by mouth 4 (four) times daily as needed for diarrhea or loose stools. 60 tablet 3   Docusate Sodium (DSS) 100 MG CAPS Take by mouth.     DULoxetine (CYMBALTA) 60 MG capsule Take 1 capsule by mouth daily.     FLUoxetine (PROZAC) 40 MG capsule Take by mouth.     gabapentin (NEURONTIN) 300 MG capsule TAKE 1 CAPSULE BY MOUTH THREE TIMES A DAY 90 capsule 0   HYDROcodone-acetaminophen (NORCO) 7.5-325 MG tablet TAKE 1 TABLET BY MOUTH EVERY 4 HOURS AS NEEDED FOR MODERATE PAIN 120 tablet 0   LORazepam (ATIVAN) 0.5 MG tablet Take 1 tablet (0.5 mg total) by mouth every 6 (six) hours as needed for anxiety. 90 tablet 0   losartan (COZAAR) 50 MG tablet daily.     magnesium oxide (MAG-OX) 400 (241.3 Mg) MG tablet Take 1 tablet (400 mg total) by mouth 2 (two) times daily. 60 tablet 1   meclizine (ANTIVERT) 25 MG tablet Take 1 tablet (25 mg total) by mouth 3 (three) times daily as needed for dizziness. 30 tablet 0   meloxicam (MOBIC) 15 MG tablet Take 1 tablet (15 mg total) by mouth daily. 30 tablet 5   metFORMIN (GLUCOPHAGE) 500 MG tablet 2 (two) times daily.     methocarbamol (ROBAXIN) 500 MG tablet Take 500 mg  by mouth 3 (three) times daily.     Multiple Vitamins-Minerals (THERA-M) TABS Take by mouth.     ondansetron (ZOFRAN) 4 MG tablet Take 1 tablet (4 mg total) by mouth every 8 (eight) hours as needed for nausea or vomiting. 20 tablet 0   prochlorperazine (COMPAZINE) 10 MG tablet Take 1 tablet (10 mg total) by mouth every 6 (six) hours as needed (Nausea or vomiting). 30 tablet 1   rosuvastatin (CRESTOR) 40 MG tablet daily.     traMADol (ULTRAM) 50 MG tablet Take 1-2 tablets (50-100 mg total) by mouth every 6 (six) hours as needed. 60 tablet 0   vitamin B-12 (CYANOCOBALAMIN) 500 MCG tablet Take 500 mcg by mouth daily.     Vitamin D, Ergocalciferol, (DRISDOL) 1.25 MG (50000 UNIT) CAPS capsule Take 50,000 Units by mouth 2 (two) times a week.     No current facility-administered medications for this visit.   Facility-Administered Medications Ordered in Other Visits  Medication Dose Route Frequency Provider Last Rate Last Admin   acetaminophen (TYLENOL) tablet 650 mg  650 mg Oral Once [name], MD       diphenhydrAMINE (BENADRYL) capsule 25 mg  25 mg Oral Once [name], MD       sodium chloride flush (NS) 0.9 % injection 10 mL  10 mL Intracatheter PRN [name], MD   10 mL at 01/14/21 1624     ASSESSMENT & PLAN:   Assessment:  1.  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 This is  felt to most likely represent a recurrence rather than a 2nd breast primary.  However, this is now HER 2 positive in addition to ER positive.  She completed 6 cycles of neoadjuvant TCHP in July.  After 4 cycles, the doses were reduced by 25%, but she still was experiencing adverse effects. She completed chemotherapy. She received three cycles of HER2 targeted therapy prior to reconstructive  surgery. She is due for 11 postop doses and has resumed the Herceptin/Perjeta.    2.  Significant arthralgias, especially of the knees.  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 This is better controlled with cortisone injections and Mobic 15 mg daily. She is on physical therapy. The orthopedic surgeon has explained to her that this this not caused by her current treatments.  3.  ATM gene mutation.  She has had bilateral mastectomies.  She is also at increased risk for pancreatic cancer but there are no standardized screening methods.  4.  Dehydration based on increased BUN of 39, even higher than her usual.  Plan:     She will proceed with a 6th cycle of maintenance trastuzumab/pertuzumab on Monday. We will see her back in 3 weeks with CBC, CMP and ECHO prior to a 7th cycle. She verbalizes understanding of and agreement to the plans discussed today. She knows to call the office should any new questions or concerns arise.    I, Rita Ohara, am acting as scribe for [name], MD  I have reviewed this report as typed by the medical scribe, and it is complete and accurate.  [name] MD [facility]

## 2021-07-17 ENCOUNTER — Other Ambulatory Visit: Payer: Self-pay | Admitting: Oncology

## 2021-07-17 DIAGNOSIS — C50919 Malignant neoplasm of unspecified site of unspecified female breast: Secondary | ICD-10-CM

## 2021-07-19 ENCOUNTER — Inpatient Hospital Stay: Payer: Medicare PPO | Attending: Oncology | Admitting: Oncology

## 2021-07-19 ENCOUNTER — Other Ambulatory Visit: Payer: Self-pay

## 2021-07-19 ENCOUNTER — Other Ambulatory Visit: Payer: Self-pay | Admitting: Oncology

## 2021-07-19 ENCOUNTER — Encounter: Payer: Self-pay | Admitting: Oncology

## 2021-07-19 ENCOUNTER — Inpatient Hospital Stay: Payer: Medicare PPO

## 2021-07-19 VITALS — HR 91 | Temp 98.0°F | Resp 16 | Ht 62.0 in | Wt 147.2 lb

## 2021-07-19 DIAGNOSIS — C50919 Malignant neoplasm of unspecified site of unspecified female breast: Secondary | ICD-10-CM

## 2021-07-19 DIAGNOSIS — E860 Dehydration: Secondary | ICD-10-CM | POA: Insufficient documentation

## 2021-07-19 DIAGNOSIS — Z1509 Genetic susceptibility to other malignant neoplasm: Secondary | ICD-10-CM | POA: Insufficient documentation

## 2021-07-19 DIAGNOSIS — C50212 Malignant neoplasm of upper-inner quadrant of left female breast: Secondary | ICD-10-CM | POA: Diagnosis not present

## 2021-07-19 DIAGNOSIS — C50012 Malignant neoplasm of nipple and areola, left female breast: Secondary | ICD-10-CM

## 2021-07-19 DIAGNOSIS — M170 Bilateral primary osteoarthritis of knee: Secondary | ICD-10-CM | POA: Diagnosis not present

## 2021-07-19 DIAGNOSIS — M810 Age-related osteoporosis without current pathological fracture: Secondary | ICD-10-CM

## 2021-07-19 DIAGNOSIS — Z5112 Encounter for antineoplastic immunotherapy: Secondary | ICD-10-CM | POA: Insufficient documentation

## 2021-07-19 DIAGNOSIS — Z170 Estrogen receptor positive status [ER+]: Secondary | ICD-10-CM | POA: Insufficient documentation

## 2021-07-19 DIAGNOSIS — Z1501 Genetic susceptibility to malignant neoplasm of breast: Secondary | ICD-10-CM | POA: Insufficient documentation

## 2021-07-19 DIAGNOSIS — C50011 Malignant neoplasm of nipple and areola, right female breast: Secondary | ICD-10-CM

## 2021-07-19 DIAGNOSIS — Z9013 Acquired absence of bilateral breasts and nipples: Secondary | ICD-10-CM | POA: Insufficient documentation

## 2021-07-19 DIAGNOSIS — C50912 Malignant neoplasm of unspecified site of left female breast: Secondary | ICD-10-CM | POA: Diagnosis not present

## 2021-07-19 DIAGNOSIS — Z79811 Long term (current) use of aromatase inhibitors: Secondary | ICD-10-CM | POA: Insufficient documentation

## 2021-07-19 DIAGNOSIS — Z7984 Long term (current) use of oral hypoglycemic drugs: Secondary | ICD-10-CM | POA: Insufficient documentation

## 2021-07-19 DIAGNOSIS — Z79899 Other long term (current) drug therapy: Secondary | ICD-10-CM | POA: Insufficient documentation

## 2021-07-19 LAB — COMPREHENSIVE METABOLIC PANEL
Albumin: 4.4 (ref 3.5–5.0)
Calcium: 10.2 (ref 8.7–10.7)

## 2021-07-19 LAB — BASIC METABOLIC PANEL
BUN: 39 — AB (ref 4–21)
CO2: 26 — AB (ref 13–22)
Chloride: 105 (ref 99–108)
Creatinine: 0.7 (ref 0.5–1.1)
Glucose: 163
Potassium: 4.5 (ref 3.4–5.3)
Sodium: 139 (ref 137–147)

## 2021-07-19 LAB — CBC AND DIFFERENTIAL
HCT: 36 (ref 36–46)
Hemoglobin: 12 (ref 12.0–16.0)
Neutrophils Absolute: 3.59
Platelets: 235 (ref 150–399)
WBC: 5.2

## 2021-07-19 LAB — HEPATIC FUNCTION PANEL
ALT: 47 — AB (ref 7–35)
AST: 31 (ref 13–35)
Alkaline Phosphatase: 71 (ref 25–125)
Bilirubin, Total: 0.6

## 2021-07-19 LAB — CBC: RBC: 3.58 — AB (ref 3.87–5.11)

## 2021-07-19 MED FILL — Pertuzumab Soln for IV Infusion 420 MG/14ML (30 MG/ML): INTRAVENOUS | Qty: 14 | Status: AC

## 2021-07-19 MED FILL — Trastuzumab-anns For IV Soln 150 MG: INTRAVENOUS | Qty: 20 | Status: AC

## 2021-07-22 ENCOUNTER — Inpatient Hospital Stay: Payer: Medicare PPO

## 2021-07-22 ENCOUNTER — Other Ambulatory Visit: Payer: Self-pay

## 2021-07-22 VITALS — BP 124/82 | HR 85 | Temp 98.1°F | Resp 18 | Ht 62.0 in | Wt 146.0 lb

## 2021-07-22 DIAGNOSIS — Z7984 Long term (current) use of oral hypoglycemic drugs: Secondary | ICD-10-CM | POA: Diagnosis not present

## 2021-07-22 DIAGNOSIS — Z170 Estrogen receptor positive status [ER+]: Secondary | ICD-10-CM | POA: Diagnosis not present

## 2021-07-22 DIAGNOSIS — Z79899 Other long term (current) drug therapy: Secondary | ICD-10-CM | POA: Diagnosis not present

## 2021-07-22 DIAGNOSIS — C50912 Malignant neoplasm of unspecified site of left female breast: Secondary | ICD-10-CM

## 2021-07-22 DIAGNOSIS — C50212 Malignant neoplasm of upper-inner quadrant of left female breast: Secondary | ICD-10-CM | POA: Diagnosis present

## 2021-07-22 DIAGNOSIS — Z9013 Acquired absence of bilateral breasts and nipples: Secondary | ICD-10-CM | POA: Diagnosis not present

## 2021-07-22 DIAGNOSIS — Z79811 Long term (current) use of aromatase inhibitors: Secondary | ICD-10-CM | POA: Diagnosis not present

## 2021-07-22 DIAGNOSIS — Z5112 Encounter for antineoplastic immunotherapy: Secondary | ICD-10-CM | POA: Diagnosis present

## 2021-07-22 DIAGNOSIS — Z1509 Genetic susceptibility to other malignant neoplasm: Secondary | ICD-10-CM | POA: Diagnosis not present

## 2021-07-22 DIAGNOSIS — E860 Dehydration: Secondary | ICD-10-CM | POA: Diagnosis not present

## 2021-07-22 DIAGNOSIS — Z1501 Genetic susceptibility to malignant neoplasm of breast: Secondary | ICD-10-CM | POA: Diagnosis not present

## 2021-07-22 MED ORDER — SODIUM CHLORIDE 0.9 % IV SOLN: Freq: Once | INTRAVENOUS | Status: AC

## 2021-07-22 MED ORDER — TRASTUZUMAB-ANNS CHEMO 150 MG IV SOLR
6.0000 mg/kg | Freq: Once | INTRAVENOUS | Status: AC
  Administered 2021-07-22: 420 mg via INTRAVENOUS
  Filled 2021-07-22: qty 20

## 2021-07-22 MED ORDER — HEPARIN SOD (PORK) LOCK FLUSH 100 UNIT/ML IV SOLN
500.0000 [IU] | Freq: Once | INTRAVENOUS | Status: AC | PRN
  Administered 2021-07-22: 500 [IU]

## 2021-07-22 MED ORDER — ACETAMINOPHEN 325 MG PO TABS
650.0000 mg | ORAL_TABLET | Freq: Once | ORAL | Status: AC
  Administered 2021-07-22: 650 mg via ORAL
  Filled 2021-07-22: qty 2

## 2021-07-22 MED ORDER — SODIUM CHLORIDE 0.9% FLUSH
10.0000 mL | INTRAVENOUS | Status: DC | PRN
  Administered 2021-07-22: 10 mL

## 2021-07-22 MED ORDER — SODIUM CHLORIDE 0.9 % IV SOLN
420.0000 mg | Freq: Once | INTRAVENOUS | Status: AC
  Administered 2021-07-22: 420 mg via INTRAVENOUS
  Filled 2021-07-22: qty 14

## 2021-07-22 MED ORDER — DIPHENHYDRAMINE HCL 25 MG PO CAPS
25.0000 mg | ORAL_CAPSULE | Freq: Once | ORAL | Status: AC
  Administered 2021-07-22: 25 mg via ORAL
  Filled 2021-07-22: qty 1

## 2021-07-22 NOTE — Patient Instructions (Signed)
Pertuzumab injection What is this medication? PERTUZUMAB (per TOOZ ue mab) is a monoclonal antibody. It is used to treat breast cancer. This medicine may be used for other purposes; ask your health care provider or pharmacist if you have questions. COMMON BRAND NAME(S): PERJETA What should I tell my care team before I take this medication? They need to know if you have any of these conditions: heart disease heart failure high blood pressure history of irregular heart beat recent or ongoing radiation therapy an unusual or allergic reaction to pertuzumab, other medicines, foods, dyes, or preservatives pregnant or trying to get pregnant breast-feeding How should I use this medication? This medicine is for infusion into a vein. It is given by a health care professional in a hospital or clinic setting. Talk to your pediatrician regarding the use of this medicine in children. Special care may be needed. Overdosage: If you think you have taken too much of this medicine contact a poison control center or emergency room at once. NOTE: This medicine is only for you. Do not share this medicine with others. What if I miss a dose? It is important not to miss your dose. Call your doctor or health care professional if you are unable to keep an appointment. What may interact with this medication? Interactions are not expected. Give your health care provider a list of all the medicines, herbs, non-prescription drugs, or dietary supplements you use. Also tell them if you smoke, drink alcohol, or use illegal drugs. Some items may interact with your medicine. This list may not describe all possible interactions. Give your health care provider a list of all the medicines, herbs, non-prescription drugs, or dietary supplements you use. Also tell them if you smoke, drink alcohol, or use illegal drugs. Some items may interact with your medicine. What should I watch for while using this medication? Your condition  will be monitored carefully while you are receiving this medicine. Report any side effects. Continue your course of treatment even though you feel ill unless your doctor tells you to stop. Do not become pregnant while taking this medicine or for 7 months after stopping it. Women should inform their doctor if they wish to become pregnant or think they might be pregnant. Women of child-bearing potential will need to have a negative pregnancy test before starting this medicine. There is a potential for serious side effects to an unborn child. Talk to your health care professional or pharmacist for more information. Do not breast-feed an infant while taking this medicine or for 7 months after stopping it. Women must use effective birth control with this medicine. Call your doctor or health care professional for advice if you get a fever, chills or sore throat, or other symptoms of a cold or flu. Do not treat yourself. Try to avoid being around people who are sick. You may experience fever, chills, and headache during the infusion. Report any side effects during the infusion to your health care professional. What side effects may I notice from receiving this medication? Side effects that you should report to your doctor or health care professional as soon as possible: breathing problems chest pain or palpitations dizziness feeling faint or lightheaded fever or chills skin rash, itching or hives sore throat swelling of the face, lips, or tongue swelling of the legs or ankles unusually weak or tired Side effects that usually do not require medical attention (report to your doctor or health care professional if they continue or are bothersome): diarrhea hair loss  nausea, vomiting tiredness This list may not describe all possible side effects. Call your doctor for medical advice about side effects. You may report side effects to FDA at 1-800-FDA-1088. Where should I keep my medication? This drug is  given in a hospital or clinic and will not be stored at home. NOTE: This sheet is a summary. It may not cover all possible information. If you have questions about this medicine, talk to your doctor, pharmacist, or health care provider.  2022 Elsevier/Gold Standard (2015-06-07 00:00:00) Trastuzumab injection for infusion What is this medication? TRASTUZUMAB (tras TOO zoo mab) is a monoclonal antibody. It is used to treat breast cancer and stomach cancer. This medicine may be used for other purposes; ask your health care provider or pharmacist if you have questions. COMMON BRAND NAME(S): Herceptin, Janae Bridgeman, Ontruzant, Trazimera What should I tell my care team before I take this medication? They need to know if you have any of these conditions: heart disease heart failure lung or breathing disease, like asthma an unusual or allergic reaction to trastuzumab, benzyl alcohol, or other medications, foods, dyes, or preservatives pregnant or trying to get pregnant breast-feeding How should I use this medication? This drug is given as an infusion into a vein. It is administered in a hospital or clinic by a specially trained health care professional. Talk to your pediatrician regarding the use of this medicine in children. This medicine is not approved for use in children. Overdosage: If you think you have taken too much of this medicine contact a poison control center or emergency room at once. NOTE: This medicine is only for you. Do not share this medicine with others. What if I miss a dose? It is important not to miss a dose. Call your doctor or health care professional if you are unable to keep an appointment. What may interact with this medication? This medicine may interact with the following medications: certain types of chemotherapy, such as daunorubicin, doxorubicin, epirubicin, and idarubicin This list may not describe all possible interactions. Give your health care  provider a list of all the medicines, herbs, non-prescription drugs, or dietary supplements you use. Also tell them if you smoke, drink alcohol, or use illegal drugs. Some items may interact with your medicine. What should I watch for while using this medication? Visit your doctor for checks on your progress. Report any side effects. Continue your course of treatment even though you feel ill unless your doctor tells you to stop. Call your doctor or health care professional for advice if you get a fever, chills or sore throat, or other symptoms of a cold or flu. Do not treat yourself. Try to avoid being around people who are sick. You may experience fever, chills and shaking during your first infusion. These effects are usually mild and can be treated with other medicines. Report any side effects during the infusion to your health care professional. Fever and chills usually do not happen with later infusions. Do not become pregnant while taking this medicine or for 7 months after stopping it. Women should inform their doctor if they wish to become pregnant or think they might be pregnant. Women of child-bearing potential will need to have a negative pregnancy test before starting this medicine. There is a potential for serious side effects to an unborn child. Talk to your health care professional or pharmacist for more information. Do not breast-feed an infant while taking this medicine or for 7 months after stopping it. Women must use  effective birth control with this medicine. What side effects may I notice from receiving this medication? Side effects that you should report to your doctor or health care professional as soon as possible: allergic reactions like skin rash, itching or hives, swelling of the face, lips, or tongue chest pain or palpitations cough dizziness feeling faint or lightheaded, falls fever general ill feeling or flu-like symptoms signs of worsening heart failure like breathing  problems; swelling in your legs and feet unusually weak or tired Side effects that usually do not require medical attention (report to your doctor or health care professional if they continue or are bothersome): bone pain changes in taste diarrhea joint pain nausea/vomiting weight loss This list may not describe all possible side effects. Call your doctor for medical advice about side effects. You may report side effects to FDA at 1-800-FDA-1088. Where should I keep my medication? This drug is given in a hospital or clinic and will not be stored at home. NOTE: This sheet is a summary. It may not cover all possible information. If you have questions about this medicine, talk to your doctor, pharmacist, or health care provider.  2022 Elsevier/Gold Standard (2016-05-20 00:00:00)

## 2021-07-26 ENCOUNTER — Encounter: Payer: Self-pay | Admitting: Oncology

## 2021-07-31 ENCOUNTER — Other Ambulatory Visit: Payer: Self-pay | Admitting: Pharmacist

## 2021-08-07 ENCOUNTER — Encounter: Payer: Self-pay | Admitting: Hematology and Oncology

## 2021-08-07 ENCOUNTER — Telehealth: Payer: Self-pay | Admitting: Hematology and Oncology

## 2021-08-07 ENCOUNTER — Inpatient Hospital Stay: Payer: Medicare PPO

## 2021-08-07 ENCOUNTER — Inpatient Hospital Stay (INDEPENDENT_AMBULATORY_CARE_PROVIDER_SITE_OTHER): Payer: Medicare PPO | Admitting: Hematology and Oncology

## 2021-08-07 ENCOUNTER — Other Ambulatory Visit: Payer: Self-pay

## 2021-08-07 DIAGNOSIS — C50912 Malignant neoplasm of unspecified site of left female breast: Secondary | ICD-10-CM | POA: Diagnosis not present

## 2021-08-07 DIAGNOSIS — C50012 Malignant neoplasm of nipple and areola, left female breast: Secondary | ICD-10-CM | POA: Diagnosis not present

## 2021-08-07 DIAGNOSIS — C50011 Malignant neoplasm of nipple and areola, right female breast: Secondary | ICD-10-CM | POA: Diagnosis not present

## 2021-08-07 DIAGNOSIS — Z170 Estrogen receptor positive status [ER+]: Secondary | ICD-10-CM | POA: Diagnosis not present

## 2021-08-07 LAB — HEPATIC FUNCTION PANEL
ALT: 47 U/L — AB (ref 7–35)
AST: 35 (ref 13–35)
Alkaline Phosphatase: 87 (ref 25–125)
Bilirubin, Total: 0.7

## 2021-08-07 LAB — BASIC METABOLIC PANEL
BUN: 24 — AB (ref 4–21)
CO2: 30 — AB (ref 13–22)
Chloride: 101 (ref 99–108)
Creatinine: 0.6 (ref 0.5–1.1)
Glucose: 154
Potassium: 4.3 mEq/L (ref 3.5–5.1)
Sodium: 136 — AB (ref 137–147)

## 2021-08-07 LAB — CBC AND DIFFERENTIAL
HCT: 39 (ref 36–46)
Hemoglobin: 12.6 (ref 12.0–16.0)
Neutrophils Absolute: 3.65
Platelets: 285 10*3/uL (ref 150–400)
WBC: 5.8

## 2021-08-07 LAB — CBC: RBC: 3.91 (ref 3.87–5.11)

## 2021-08-07 LAB — COMPREHENSIVE METABOLIC PANEL
Albumin: 4.4 (ref 3.5–5.0)
Calcium: 10 (ref 8.7–10.7)

## 2021-08-07 NOTE — Telephone Encounter (Signed)
08/07/21 los next appt scheduled and confirmed with patient ?

## 2021-08-07 NOTE — Assessment & Plan Note (Deleted)
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 This is felt to most likely represent a recurrence rather than a 2nd breast primary.??However, this is now HER 2 positive in addition to ER positive.  She completed 6 cycles of neoadjuvant TCHP?in July.  After 4 cycles, the doses were reduced by 25%, but she still was experiencing adverse effects. She completed chemotherapy. She received three cycles of HER2 targeted therapy prior to reconstructive surgery. She is due for 11 postop doses and is willing to resume the Herceptin/Perjeta.   ?

## 2021-08-07 NOTE — Assessment & Plan Note (Addendum)
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 This is felt to most likely represent a recurrence rather than a 2nd breast primary.??However, this is now HER 2 positive in addition to ER positive.  She completed 6 cycles of neoadjuvant TCHP?in July.  After 4 cycles, the doses were reduced by 25%, but she still was experiencing adverse effects. She completed chemotherapy. She received three cycles of HER2 targeted therapy prior to reconstructive surgery. She is due for 11 postop doses and is willing to resume the Herceptin/Perjeta. She will proceed with cycle 7 next week. She will return to clinic in 3 weeks for repeat evaluation prior to cycle 8. ?

## 2021-08-07 NOTE — Progress Notes (Signed)
?Patient Care Team: ?[name], FNP as PCP - General (Family Medicine) ?[name], MD as Consulting Physician (Oncology) ?[name], MD as Referring Physician (Endocrinology) ?[name], DO as Consulting Physician (Cardiology) ?[name], MD as Consulting Physician (General Surgery) ?[name], MD as Consulting Physician (Plastic Surgery) ? ?Clinic Day:  08/07/2021 ? ?Referring physician: [name], FNP ? ?ASSESSMENT & PLAN:  ? 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 This is felt to most likely represent a recurrence rather than a 2nd breast primary.  However, this is now HER 2 positive in addition to ER positive.  She completed 6 cycles of neoadjuvant TCHP in July.  After 4 cycles, the doses were reduced by 25%, but she still was experiencing adverse effects. She completed chemotherapy. She received three cycles of HER2 targeted therapy prior to reconstructive surgery. She is due for 11 postop doses and is willing to resume the Herceptin/Perjeta. She will proceed with cycle 7 next week. She will return to clinic in 3 weeks for repeat evaluation prior to cycle 8. ?  ? ?The patient understands the plans discussed today and is in agreement with them.  She knows to contact our office if she develops concerns prior to her next appointment. ? ? ? ?[name], NP  ?[address] ?[place] [address] ?Dept: [phone] ?Dept Fax: [phone]  ? ?No orders of the defined types were placed in this encounter. ?  ? ? ?CHIEF COMPLAINT:  ?CC: A 71 year old female with history of breast cancer here for 3 week  evaluation. ? ?Current Treatment:  Trastuzumab/ Pertuzumab ? ?INTERVAL HISTORY:  ?Tammy Fowler is here today for repeat clinical assessment. She denies fevers or chills. She denies pain. Her appetite is good. Her weight has been stable. ? ?I have reviewed the past medical history, past surgical history, social history and family history with the patient and they are unchanged from previous note. ? ?ALLERGIES:  is allergic to oxycodone, codeine, and lisinopril. ? ?MEDICATIONS:  ?Current Outpatient Medications  ?Medication Sig Dispense Refill  ? ACCU-CHEK GUIDE test strip daily. for testing as directed    ? aspirin 81 MG chewable tablet Chew by mouth.    ? benzonatate (TESSALON) 100 MG capsule     ? calcium-vitamin D 250-100 MG-UNIT tablet Take by mouth.    ? celecoxib (CELEBREX) 100 MG capsule Take by mouth.    ? diphenoxylate-atropine (LOMOTIL) 2.5-0.025 MG tablet Take 2 tablets by mouth 4 (four) times daily as needed for diarrhea or loose stools. 60 tablet 3  ? Docusate Sodium (DSS) 100 MG CAPS Take by mouth.    ? DULoxetine (CYMBALTA) 60 MG capsule Take 1 capsule by mouth daily.    ? FLUoxetine (PROZAC) 40 MG capsule Take by mouth.    ? gabapentin (NEURONTIN) 300 MG capsule TAKE 1 CAPSULE BY MOUTH THREE TIMES A DAY 90 capsule 0  ? HYDROcodone-acetaminophen (NORCO) 7.5-325 MG tablet TAKE 1 TABLET BY MOUTH EVERY 4 HOURS AS NEEDED FOR MODERATE PAIN 120 tablet 0  ? LORazepam (ATIVAN) 0.5 MG tablet Take 1 tablet (0.5 mg total) by mouth every 6 (six) hours as needed for anxiety. 90 tablet 0  ? losartan (COZAAR) 50  MG tablet daily.    ? magnesium oxide (MAG-OX) 400 (241.3 Mg) MG tablet Take 1 tablet (400 mg total) by mouth 2 (two) times daily. (Patient not taking: Reported on 08/07/2021) 60 tablet 1  ? meclizine (ANTIVERT) 25 MG tablet Take 1 tablet (25 mg total) by mouth 3 (three) times daily as needed for dizziness. 30 tablet 0  ? meloxicam (MOBIC) 15 MG tablet Take 1 tablet (15 mg total) by mouth daily. 30 tablet 5  ?  metFORMIN (GLUCOPHAGE) 500 MG tablet 2 (two) times daily.    ? methocarbamol (ROBAXIN) 500 MG tablet Take 500 mg by mouth 3 (three) times daily.    ? Multiple Vitamins-Minerals (THERA-M) TABS Take by mouth.    ? ondansetron (ZOFRAN) 4 MG tablet Take 1 tablet (4 mg total) by mouth every 8 (eight) hours as needed for nausea or vomiting. 20 tablet 0  ? prochlorperazine (COMPAZINE) 10 MG tablet Take 1 tablet (10 mg total) by mouth every 6 (six) hours as needed (Nausea or vomiting). 30 tablet 1  ? rosuvastatin (CRESTOR) 40 MG tablet daily.    ? traMADol (ULTRAM) 50 MG tablet Take 1-2 tablets (50-100 mg total) by mouth every 6 (six) hours as needed. 60 tablet 0  ? vitamin B-12 (CYANOCOBALAMIN) 500 MCG tablet Take 500 mcg by mouth daily.    ? Vitamin D, Ergocalciferol, (DRISDOL) 1.25 MG (50000 UNIT) CAPS capsule Take 50,000 Units by mouth 2 (two) times a week.    ? ?No current facility-administered medications for this visit.  ? ?Facility-Administered Medications Ordered in Other Visits  ?Medication Dose Route Frequency Provider Last Rate Last Admin  ? acetaminophen (TYLENOL) tablet 650 mg  650 mg Oral Once [name], MD      ? diphenhydrAMINE (BENADRYL) capsule 25 mg  25 mg Oral Once [name], MD      ? sodium chloride flush (NS) 0.9 % injection 10 mL  10 mL Intracatheter PRN [name], MD   10 mL at 01/14/21 1624  ? ? ?HISTORY OF PRESENT ILLNESS:  ? ?Oncology History  ?Breast cancer in female [facility])  ?11/03/2002 Initial Diagnosis  ? Breast cancer in female [facility]) ?  ?11/03/2002 Cancer Staging  ? Staging form: Breast, AJCC 6th Edition ?- Clinical stage from 11/03/2002: Stage IIIA (T2, N2, M0) - Signed by [name], MD on 06/04/2020 ?Staging comments: Treated with AC x4, Taxol x4, anastrazole x 10 years, completed April 2015 ?Prognostic indicators: ER/PR pos, HER2 neg, age 48 at dx, had re-excision for pos margin ? ?  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 Local recurrence of cancer of right breast [facility]) ?  ?06/20/2020 Cancer Staging  ? Staging form: Breast, AJCC 8th Edition ?- Clinical stage from 06/20/2020: Stage IIA (rcT2, cN0, cM0, G3, ER+, PR-, HER2+) - Signed by [name], MD on 06/27/2020 ?Histopathologic type: Carcinoma, NOS ?Stage prefix: Recurrence ?Method of lymph node assessment: Clinical ?Nuclear grade: G3 ?Multigene prognostic tests performed: None ?Menopausal status: Postmenopausal ?Ki-67 (%): 25 ?Stage used in treatment planning: Yes ?National guidelines used in treatment planning: Yes ?Type of national guideline used in treatment planning: NCCN ?Staging comments: 3.2 total size, incl satellite lesion.  Arising in adipose tissue so susp for recurrence but now HER @ positive ? ?  ?07/03/2020 - 10/22/2020 Chemotherapy  ?  Patient is on Treatment Plan: BREAST TRASTUZUMAB + PERTUZUMAB Q21D X 11 CYCLES ? ?  ? ?  ?11/08/2020 -  Chemotherapy  ? Patient is on Treatment Plan : BREAST Trastuzumab + Pertuzumab q21d x 11 cycles  ?   ?12/13/2020 Cancer Staging  ? Staging form: Breast, AJCC 8th Edition ?- Pathologic stage from 12/13/2020: No Stage Recommended (ypT1c, pN0, cM0, G2, ER+, PR-, HER2+) - Signed by [name], MD on 01/10/2021 ?Histopathologic type: Carcinoma, NOS ?Stage prefix: Post-therapy ?Response to neoadjuvant therapy: Partial response ?Method of lymph node assessment: Clinical ?Nuclear grade: G2 ?Multigene prognostic tests performed: None ?Histologic grading system: 3 grade system ?Residual tumor (R): R0 - None ?Laterality: Left ?Tumor size (mm): 15 ?Lymph-vascular invasion (LVI): LVI not present (absent)/not identified ?Diagnostic confirmation: Positive histology ?Specimen type: Excision ?Staged by: Managing physician ?Menopausal status: Postmenopausal ?Ki-67 (%): 25 ?Stage used in treatment planning: Yes ?National guidelines used in treatment planning: Yes ?Type of national guideline used in treatment planning:  NCCN ? ?  ?Hypomagnesemia  ?  ? ? ?REVIEW OF SYSTEMS:  ? ?Constitutional: Denies fevers, chills or abnormal weight loss ?Eyes: Denies blurriness of vision ?Ears, nose, mouth, throat, and face: Denies mucositis or sore throat ?Respirator

## 2021-08-09 ENCOUNTER — Other Ambulatory Visit: Payer: Medicare PPO

## 2021-08-09 ENCOUNTER — Ambulatory Visit: Payer: Medicare PPO | Admitting: Hematology and Oncology

## 2021-08-09 ENCOUNTER — Encounter: Payer: Self-pay | Admitting: Oncology

## 2021-08-09 NOTE — Progress Notes (Unsigned)
?  ? ? ?  Authorization number ?[employer] - [id number] ? ? ? ? ? ? ?

## 2021-08-12 ENCOUNTER — Other Ambulatory Visit: Payer: Self-pay

## 2021-08-12 ENCOUNTER — Inpatient Hospital Stay: Payer: Medicare PPO

## 2021-08-12 VITALS — BP 125/83 | HR 101 | Temp 98.5°F | Resp 20 | Ht 61.0 in | Wt 145.1 lb

## 2021-08-12 DIAGNOSIS — C50912 Malignant neoplasm of unspecified site of left female breast: Secondary | ICD-10-CM

## 2021-08-12 DIAGNOSIS — Z5112 Encounter for antineoplastic immunotherapy: Secondary | ICD-10-CM | POA: Diagnosis not present

## 2021-08-12 MED ORDER — ACETAMINOPHEN 325 MG PO TABS
650.0000 mg | ORAL_TABLET | Freq: Once | ORAL | Status: AC
  Administered 2021-08-12: 650 mg via ORAL
  Filled 2021-08-12: qty 2

## 2021-08-12 MED ORDER — SODIUM CHLORIDE 0.9% FLUSH: 10.0000 mL | INTRAVENOUS | Status: DC | PRN

## 2021-08-12 MED ORDER — SODIUM CHLORIDE 0.9 % IV SOLN: Freq: Once | INTRAVENOUS | Status: AC

## 2021-08-12 MED ORDER — TRASTUZUMAB-ANNS CHEMO 150 MG IV SOLR
6.0000 mg/kg | Freq: Once | INTRAVENOUS | Status: AC
  Administered 2021-08-12: 420 mg via INTRAVENOUS
  Filled 2021-08-12: qty 20

## 2021-08-12 MED ORDER — DIPHENHYDRAMINE HCL 25 MG PO CAPS
25.0000 mg | ORAL_CAPSULE | Freq: Once | ORAL | Status: AC
  Administered 2021-08-12: 25 mg via ORAL
  Filled 2021-08-12: qty 1

## 2021-08-12 MED ORDER — SODIUM CHLORIDE 0.9 % IV SOLN
420.0000 mg | Freq: Once | INTRAVENOUS | Status: AC
  Administered 2021-08-12: 420 mg via INTRAVENOUS
  Filled 2021-08-12: qty 14

## 2021-08-12 MED ORDER — HEPARIN SOD (PORK) LOCK FLUSH 100 UNIT/ML IV SOLN: 500.0000 [IU] | Freq: Once | INTRAVENOUS | Status: DC | PRN

## 2021-08-12 NOTE — Patient Instructions (Signed)
Pertuzumab injection ?What is this medication? ?PERTUZUMAB (per TOOZ ue mab) is a monoclonal antibody. It is used to treat breast cancer. ?This medicine may be used for other purposes; ask your health care provider or pharmacist if you have questions. ?COMMON BRAND NAME(S): PERJETA ?What should I tell my care team before I take this medication? ?They need to know if you have any of these conditions: ?heart disease ?heart failure ?high blood pressure ?history of irregular heart beat ?recent or ongoing radiation therapy ?an unusual or allergic reaction to pertuzumab, other medicines, foods, dyes, or preservatives ?pregnant or trying to get pregnant ?breast-feeding ?How should I use this medication? ?This medicine is for infusion into a vein. It is given by a health care professional in a hospital or clinic setting. ?Talk to your pediatrician regarding the use of this medicine in children. Special care may be needed. ?Overdosage: If you think you have taken too much of this medicine contact a poison control center or emergency room at once. ?NOTE: This medicine is only for you. Do not share this medicine with others. ?What if I miss a dose? ?It is important not to miss your dose. Call your doctor or health care professional if you are unable to keep an appointment. ?What may interact with this medication? ?Interactions are not expected. ?Give your health care provider a list of all the medicines, herbs, non-prescription drugs, or dietary supplements you use. Also tell them if you smoke, drink alcohol, or use illegal drugs. Some items may interact with your medicine. ?This list may not describe all possible interactions. Give your health care provider a list of all the medicines, herbs, non-prescription drugs, or dietary supplements you use. Also tell them if you smoke, drink alcohol, or use illegal drugs. Some items may interact with your medicine. ?What should I watch for while using this medication? ?Your condition  will be monitored carefully while you are receiving this medicine. Report any side effects. Continue your course of treatment even though you feel ill unless your doctor tells you to stop. ?Do not become pregnant while taking this medicine or for 7 months after stopping it. Women should inform their doctor if they wish to become pregnant or think they might be pregnant. Women of child-bearing potential will need to have a negative pregnancy test before starting this medicine. There is a potential for serious side effects to an unborn child. Talk to your health care professional or pharmacist for more information. Do not breast-feed an infant while taking this medicine or for 7 months after stopping it. ?Women must use effective birth control with this medicine. ?Call your doctor or health care professional for advice if you get a fever, chills or sore throat, or other symptoms of a cold or flu. Do not treat yourself. Try to avoid being around people who are sick. ?You may experience fever, chills, and headache during the infusion. Report any side effects during the infusion to your health care professional. ?What side effects may I notice from receiving this medication? ?Side effects that you should report to your doctor or health care professional as soon as possible: ?breathing problems ?chest pain or palpitations ?dizziness ?feeling faint or lightheaded ?fever or chills ?skin rash, itching or hives ?sore throat ?swelling of the face, lips, or tongue ?swelling of the legs or ankles ?unusually weak or tired ?Side effects that usually do not require medical attention (report to your doctor or health care professional if they continue or are bothersome): ?diarrhea ?hair loss ?  nausea, vomiting ?tiredness ?This list may not describe all possible side effects. Call your doctor for medical advice about side effects. You may report side effects to FDA at 1-800-FDA-1088. ?Where should I keep my medication? ?This drug is  given in a hospital or clinic and will not be stored at home. ?NOTE: This sheet is a summary. It may not cover all possible information. If you have questions about this medicine, talk to your doctor, pharmacist, or health care provider. ?? 2022 Elsevier/Gold Standard (2015-06-07 00:00:00) ?Trastuzumab injection for infusion ?What is this medication? ?TRASTUZUMAB (tras TOO zoo mab) is a monoclonal antibody. It is used to treat breast cancer and stomach cancer. ?This medicine may be used for other purposes; ask your health care provider or pharmacist if you have questions. ?COMMON BRAND NAME(S): Herceptin, Belenda Cruise, Ogivri, Ontruzant, Trazimera ?What should I tell my care team before I take this medication? ?They need to know if you have any of these conditions: ?heart disease ?heart failure ?lung or breathing disease, like asthma ?an unusual or allergic reaction to trastuzumab, benzyl alcohol, or other medications, foods, dyes, or preservatives ?pregnant or trying to get pregnant ?breast-feeding ?How should I use this medication? ?This drug is given as an infusion into a vein. It is administered in a hospital or clinic by a specially trained health care professional. ?Talk to your pediatrician regarding the use of this medicine in children. This medicine is not approved for use in children. ?Overdosage: If you think you have taken too much of this medicine contact a poison control center or emergency room at once. ?NOTE: This medicine is only for you. Do not share this medicine with others. ?What if I miss a dose? ?It is important not to miss a dose. Call your doctor or health care professional if you are unable to keep an appointment. ?What may interact with this medication? ?This medicine may interact with the following medications: ?certain types of chemotherapy, such as daunorubicin, doxorubicin, epirubicin, and idarubicin ?This list may not describe all possible interactions. Give your health care  provider a list of all the medicines, herbs, non-prescription drugs, or dietary supplements you use. Also tell them if you smoke, drink alcohol, or use illegal drugs. Some items may interact with your medicine. ?What should I watch for while using this medication? ?Visit your doctor for checks on your progress. Report any side effects. Continue your course of treatment even though you feel ill unless your doctor tells you to stop. ?Call your doctor or health care professional for advice if you get a fever, chills or sore throat, or other symptoms of a cold or flu. Do not treat yourself. Try to avoid being around people who are sick. ?You may experience fever, chills and shaking during your first infusion. These effects are usually mild and can be treated with other medicines. Report any side effects during the infusion to your health care professional. Fever and chills usually do not happen with later infusions. ?Do not become pregnant while taking this medicine or for 7 months after stopping it. Women should inform their doctor if they wish to become pregnant or think they might be pregnant. Women of child-bearing potential will need to have a negative pregnancy test before starting this medicine. There is a potential for serious side effects to an unborn child. Talk to your health care professional or pharmacist for more information. Do not breast-feed an infant while taking this medicine or for 7 months after stopping it. ?Women must use  effective birth control with this medicine. ?What side effects may I notice from receiving this medication? ?Side effects that you should report to your doctor or health care professional as soon as possible: ?allergic reactions like skin rash, itching or hives, swelling of the face, lips, or tongue ?chest pain or palpitations ?cough ?dizziness ?feeling faint or lightheaded, falls ?fever ?general ill feeling or flu-like symptoms ?signs of worsening heart failure like breathing  problems; swelling in your legs and feet ?unusually weak or tired ?Side effects that usually do not require medical attention (report to your doctor or health care professional if they continue or are botherso

## 2021-08-15 DIAGNOSIS — I351 Nonrheumatic aortic (valve) insufficiency: Secondary | ICD-10-CM | POA: Diagnosis not present

## 2021-08-19 ENCOUNTER — Encounter: Payer: Self-pay | Admitting: Oncology

## 2021-08-20 ENCOUNTER — Encounter: Payer: Self-pay | Admitting: Oncology

## 2021-08-21 ENCOUNTER — Encounter: Payer: Self-pay | Admitting: Oncology

## 2021-08-30 ENCOUNTER — Telehealth: Payer: Self-pay | Admitting: Hematology and Oncology

## 2021-08-30 ENCOUNTER — Inpatient Hospital Stay: Payer: Medicare PPO

## 2021-08-30 ENCOUNTER — Other Ambulatory Visit: Payer: Self-pay | Admitting: Hematology and Oncology

## 2021-08-30 ENCOUNTER — Inpatient Hospital Stay: Payer: Medicare PPO | Attending: Oncology | Admitting: Hematology and Oncology

## 2021-08-30 ENCOUNTER — Encounter: Payer: Self-pay | Admitting: Hematology and Oncology

## 2021-08-30 DIAGNOSIS — C50011 Malignant neoplasm of nipple and areola, right female breast: Secondary | ICD-10-CM

## 2021-08-30 DIAGNOSIS — Z5112 Encounter for antineoplastic immunotherapy: Secondary | ICD-10-CM | POA: Insufficient documentation

## 2021-08-30 DIAGNOSIS — C50912 Malignant neoplasm of unspecified site of left female breast: Secondary | ICD-10-CM

## 2021-08-30 DIAGNOSIS — Z7984 Long term (current) use of oral hypoglycemic drugs: Secondary | ICD-10-CM | POA: Insufficient documentation

## 2021-08-30 DIAGNOSIS — Z79899 Other long term (current) drug therapy: Secondary | ICD-10-CM | POA: Insufficient documentation

## 2021-08-30 DIAGNOSIS — Z170 Estrogen receptor positive status [ER+]: Secondary | ICD-10-CM | POA: Insufficient documentation

## 2021-08-30 DIAGNOSIS — C50212 Malignant neoplasm of upper-inner quadrant of left female breast: Secondary | ICD-10-CM | POA: Insufficient documentation

## 2021-08-30 LAB — CBC AND DIFFERENTIAL
HCT: 35 — AB (ref 36–46)
Hemoglobin: 11.5 — AB (ref 12.0–16.0)
Neutrophils Absolute: 11.14
Platelets: 301 10*3/uL (ref 150–400)
WBC: 12.8

## 2021-08-30 LAB — BASIC METABOLIC PANEL
BUN: 29 — AB (ref 4–21)
CO2: 28 — AB (ref 13–22)
Chloride: 97 — AB (ref 99–108)
Creatinine: 0.8 (ref 0.5–1.1)
Glucose: 216
Potassium: 3.7 mEq/L (ref 3.5–5.1)
Sodium: 137 (ref 137–147)

## 2021-08-30 LAB — CBC: RBC: 3.61 — AB (ref 3.87–5.11)

## 2021-08-30 LAB — COMPREHENSIVE METABOLIC PANEL
Albumin: 4.9 (ref 3.5–5.0)
Calcium: 10 (ref 8.7–10.7)

## 2021-08-30 LAB — HEPATIC FUNCTION PANEL
ALT: 34 U/L (ref 7–35)
AST: 27 (ref 13–35)
Alkaline Phosphatase: 84 (ref 25–125)
Bilirubin, Total: 0.6

## 2021-08-30 NOTE — Telephone Encounter (Signed)
Per 08/30/21 los next appt scheduled and confirmed with patient ?

## 2021-08-30 NOTE — Progress Notes (Addendum)
?Patient Care Team: ?[name], FNP as PCP - General (Family Medicine) ?[name], MD as Consulting Physician (Oncology) ?[name], MD as Referring Physician (Endocrinology) ?[name], DO as Consulting Physician (Cardiology) ?[name], MD as Consulting Physician (General Surgery) ?[name], MD as Consulting Physician (Plastic Surgery) ? ?Clinic Day:  08/30/21 ? ?Referring physician: [name], FNP ? ?ASSESSMENT & PLAN:  ? 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 This is felt to most likely represent a recurrence rather than a 2nd breast primary.  However, this is now HER 2 positive in addition to ER positive.  She completed 6 cycles of neoadjuvant TCHP in July.  After 4 cycles, the doses were reduced by 25%, but she still was experiencing adverse effects. She completed chemotherapy. She received three cycles of HER2 targeted therapy prior to reconstructive surgery. She is due for 11 postop doses and is willing to resume the Herceptin/Perjeta. She will proceed with cycle 8 next week. She will return to clinic in 3 weeks for repeat evaluation prior to cycle 9.  ? ?The patient understands the plans discussed today and is in agreement with them.  She knows to contact our office if she develops concerns prior to her next appointment. ? ? ? ?[name], MD  ?[facility] ?[place] ?[name] [id number] ?Dept: [phone] ?Dept Fax: [phone]  ? ?Orders Placed This Encounter  ?Procedures  ? CBC and differential  ?  This external order was created through the Results Console.  ? CBC  ?  This external order was created  through the Results Console.  ? Basic metabolic panel  ?  This external order was created through the Results Console.  ? Comprehensive metabolic panel  ?  This external order was created through the Results Console.  ? Hepatic function panel  ?  This external order was created through the Results Console.  ?  ? ? ?CHIEF COMPLAINT:  ?CC: A 71 year old female with history of breast cancer here for 3 week evaluation. ? ?Current Treatment:  Trastuzumab/ Pertuzumab ? ?INTERVAL HISTORY:  ?Maryfrances is here today for repeat clinical assessment. She denies fevers or chills. She denies pain. Her appetite is good. Her weight has been stable. ? ?I have reviewed the past medical history, past surgical history, social history and family history with the patient and they are unchanged from previous note. ? ?ALLERGIES:  is allergic to oxycodone, codeine, and lisinopril. ? ?MEDICATIONS:  ?Current Outpatient Medications  ?Medication Sig Dispense Refill  ? ACCU-CHEK GUIDE test strip daily. for testing as directed    ? aspirin 81 MG chewable tablet Chew by mouth.    ? calcium-vitamin D 250-100 MG-UNIT tablet Take by mouth.    ? DULoxetine (CYMBALTA) 60 MG capsule Take 1 capsule by mouth daily.    ? FLUoxetine (PROZAC) 40 MG capsule Take by mouth.    ? gabapentin (NEURONTIN) 300 MG capsule TAKE 1 CAPSULE BY MOUTH THREE TIMES A DAY 90 capsule 0  ? HYDROcodone-acetaminophen (NORCO) 7.5-325 MG tablet TAKE 1 TABLET BY MOUTH EVERY 4 HOURS AS NEEDED FOR MODERATE PAIN 120 tablet 0  ? loperamide (IMODIUM A-D) 2 MG tablet Take 2 mg by mouth 4 (  four) times daily as needed for diarrhea or loose stools.    ? LORazepam (ATIVAN) 0.5 MG tablet Take 1 tablet (0.5 mg total) by mouth every 6 (six) hours as needed for anxiety. 90 tablet 0  ? losartan (COZAAR) 50 MG tablet daily.    ? meloxicam (MOBIC) 15 MG tablet Take 1 tablet (15 mg total) by mouth daily. 30 tablet 5  ? metFORMIN (GLUCOPHAGE) 500 MG tablet 2 (two) times daily.    ? Multiple  Vitamins-Minerals (THERA-M) TABS Take by mouth.    ? ondansetron (ZOFRAN) 4 MG tablet Take 1 tablet (4 mg total) by mouth every 8 (eight) hours as needed for nausea or vomiting. 20 tablet 0  ? rosuvastatin (CRESTOR) 40 MG tablet daily.    ? traMADol (ULTRAM) 50 MG tablet Take 1-2 tablets (50-100 mg total) by mouth every 6 (six) hours as needed. 60 tablet 0  ? benzonatate (TESSALON) 100 MG capsule  (Patient not taking: Reported on 08/30/2021)    ? celecoxib (CELEBREX) 100 MG capsule Take by mouth. (Patient not taking: Reported on 08/30/2021)    ? diphenoxylate-atropine (LOMOTIL) 2.5-0.025 MG tablet Take 2 tablets by mouth 4 (four) times daily as needed for diarrhea or loose stools. (Patient not taking: Reported on 08/30/2021) 60 tablet 3  ? Docusate Sodium (DSS) 100 MG CAPS Take by mouth. (Patient not taking: Reported on 08/30/2021)    ? Hylan (SYNVISC ONE) 48 MG/6ML SOSY PROVIDER/OFFICE TO INJECT 1 (48MG/6ML) PFS INTRA-ARTICULARLY INTO AFFECTED RIGHT & LEFT  KNEE, x once as directed; Q 60MO PRN    ? magnesium oxide (MAG-OX) 400 (241.3 Mg) MG tablet Take 1 tablet (400 mg total) by mouth 2 (two) times daily. (Patient not taking: Reported on 08/07/2021) 60 tablet 1  ? meclizine (ANTIVERT) 25 MG tablet Take 1 tablet (25 mg total) by mouth 3 (three) times daily as needed for dizziness. (Patient not taking: Reported on 08/30/2021) 30 tablet 0  ? methocarbamol (ROBAXIN) 500 MG tablet Take 500 mg by mouth 3 (three) times daily. (Patient not taking: Reported on 08/30/2021)    ? prochlorperazine (COMPAZINE) 10 MG tablet Take 1 tablet (10 mg total) by mouth every 6 (six) hours as needed (Nausea or vomiting). 30 tablet 1  ? vitamin B-12 (CYANOCOBALAMIN) 500 MCG tablet Take 500 mcg by mouth daily. (Patient not taking: Reported on 08/30/2021)    ? Vitamin D, Ergocalciferol, (DRISDOL) 1.25 MG (50000 UNIT) CAPS capsule Take 50,000 Units by mouth 2 (two) times a week. (Patient not taking: Reported on 08/30/2021)    ? ?No current  facility-administered medications for this visit.  ? ?Facility-Administered Medications Ordered in Other Visits  ?Medication Dose Route Frequency Provider Last Rate Last Admin  ? acetaminophen (TYLENOL) tablet 650 mg  650 mg Oral Once [name], MD      ? diphenhydrAMINE (BENADRYL) capsule 25 mg  25 mg Oral Once [name], MD      ? sodium chloride flush (NS) 0.9 % injection 10 mL  10 mL Intracatheter PRN [name], MD   10 mL at 01/14/21 1624  ? ? ?HISTORY OF PRESENT ILLNESS:  ? ?Oncology History  ?Breast cancer in female [facility])  ?11/03/2002 Initial Diagnosis  ? Breast cancer in female [facility]) ? ?  ?11/03/2002 Cancer Staging  ? Staging form: Breast, AJCC 6th Edition ?- Clinical stage from 11/03/2002: Stage IIIA (T2, N2, M0) - Signed by [name], MD on 06/04/2020 ?Staging comments: Treated with AC x4, Taxol x4,  anastrazole x 10 years, completed April 2015 ?Prognostic indicators: ER/PR pos, HER2 neg, age 33 at dx, had re-excision for pos margin ? ?  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 Local recurrence of cancer of right breast ([place]) ? ?  ?06/20/2020 Cancer Staging  ? Staging form: Breast, AJCC 8th Edition ?- Clinical stage from 06/20/2020: Stage IIA (rcT2, cN0, cM0, G3, ER+, PR-, HER2+) - Signed by [name], MD on 06/27/2020 ?Histopathologic type: Carcinoma, NOS ?Stage prefix: Recurrence ?Method of lymph node assessment: Clinical ?Nuclear grade: G3 ?Multigene prognostic tests performed: None ?Menopausal status: Postmenopausal ?Ki-67 (%): 25 ?Stage used in treatment planning: Yes ?National guidelines used in treatment planning: Yes ?Type of national guideline used in treatment planning: NCCN ?Staging comments: 3.2 total size, incl satellite lesion.  Arising in adipose tissue so susp for recurrence but now HER @ positive ? ?  ?07/03/2020 - 10/22/2020 Chemotherapy  ?  Patient is on Treatment Plan: BREAST TRASTUZUMAB + PERTUZUMAB Q21D  X 11 CYCLES ? ?  ? ?  ?11/08/2020 -  Chemotherapy  ? Patient is on Treatment Plan : BREAST Trastuzumab + Pertuzumab q21d x 11 cycles  ? ?  ?  ?12/13/2020 Cancer Staging  ? Staging form: Breast, AJCC 8th Edition ?- Pathologic stage fro

## 2021-08-30 NOTE — Assessment & Plan Note (Signed)
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 This is felt to most likely represent a recurrence rather than a 2nd breast primary.??However, this is now HER 2 positive in addition to ER positive.  She completed 6 cycles of neoadjuvant TCHP?in July.  After 4 cycles, the doses were reduced by 25%, but she still was experiencing adverse effects. She completed chemotherapy. She received three cycles of HER2 targeted therapy prior to reconstructive surgery. She is due for 11 postop doses and is willing to resume the Herceptin/Perjeta. She will proceed with cycle 8 next week. She will return to clinic in 3 weeks for repeat evaluation prior to cycle 9. ?

## 2021-09-02 ENCOUNTER — Other Ambulatory Visit: Payer: Self-pay

## 2021-09-02 ENCOUNTER — Inpatient Hospital Stay: Payer: Medicare PPO

## 2021-09-02 VITALS — BP 136/65 | HR 109 | Temp 98.2°F | Resp 18 | Ht 61.0 in | Wt 143.0 lb

## 2021-09-02 DIAGNOSIS — Z7984 Long term (current) use of oral hypoglycemic drugs: Secondary | ICD-10-CM | POA: Diagnosis not present

## 2021-09-02 DIAGNOSIS — Z170 Estrogen receptor positive status [ER+]: Secondary | ICD-10-CM | POA: Diagnosis not present

## 2021-09-02 DIAGNOSIS — C50212 Malignant neoplasm of upper-inner quadrant of left female breast: Secondary | ICD-10-CM | POA: Diagnosis present

## 2021-09-02 DIAGNOSIS — Z79899 Other long term (current) drug therapy: Secondary | ICD-10-CM | POA: Diagnosis not present

## 2021-09-02 DIAGNOSIS — Z5112 Encounter for antineoplastic immunotherapy: Secondary | ICD-10-CM | POA: Diagnosis present

## 2021-09-02 DIAGNOSIS — C50912 Malignant neoplasm of unspecified site of left female breast: Secondary | ICD-10-CM

## 2021-09-02 MED ORDER — SODIUM CHLORIDE 0.9 % IV SOLN: Freq: Once | INTRAVENOUS | Status: AC

## 2021-09-02 MED ORDER — SODIUM CHLORIDE 0.9 % IV SOLN
420.0000 mg | Freq: Once | INTRAVENOUS | Status: AC
  Administered 2021-09-02: 420 mg via INTRAVENOUS
  Filled 2021-09-02: qty 14

## 2021-09-02 MED ORDER — HEPARIN SOD (PORK) LOCK FLUSH 100 UNIT/ML IV SOLN
500.0000 [IU] | Freq: Once | INTRAVENOUS | Status: AC | PRN
  Administered 2021-09-02: 500 [IU]

## 2021-09-02 MED ORDER — ACETAMINOPHEN 325 MG PO TABS
650.0000 mg | ORAL_TABLET | Freq: Once | ORAL | Status: AC
  Administered 2021-09-02: 650 mg via ORAL
  Filled 2021-09-02: qty 2

## 2021-09-02 MED ORDER — DIPHENHYDRAMINE HCL 25 MG PO CAPS
25.0000 mg | ORAL_CAPSULE | Freq: Once | ORAL | Status: AC
  Administered 2021-09-02: 25 mg via ORAL
  Filled 2021-09-02: qty 1

## 2021-09-02 MED ORDER — TRASTUZUMAB-ANNS CHEMO 150 MG IV SOLR
6.0000 mg/kg | Freq: Once | INTRAVENOUS | Status: AC
  Administered 2021-09-02: 420 mg via INTRAVENOUS
  Filled 2021-09-02: qty 20

## 2021-09-02 MED ORDER — SODIUM CHLORIDE 0.9% FLUSH
10.0000 mL | INTRAVENOUS | Status: DC | PRN
  Administered 2021-09-02: 10 mL

## 2021-09-02 NOTE — Patient Instructions (Signed)
Trastuzumab injection for infusion ?What is this medication? ?TRASTUZUMAB (tras TOO zoo mab) is a monoclonal antibody. It is used to treat breast cancer and stomach cancer. ?This medicine may be used for other purposes; ask your health care provider or pharmacist if you have questions. ?COMMON BRAND NAME(S): Herceptin, Herzuma, KANJINTI, Ogivri, Ontruzant, Trazimera ?What should I tell my care team before I take this medication? ?They need to know if you have any of these conditions: ?heart disease ?heart failure ?lung or breathing disease, like asthma ?an unusual or allergic reaction to trastuzumab, benzyl alcohol, or other medications, foods, dyes, or preservatives ?pregnant or trying to get pregnant ?breast-feeding ?How should I use this medication? ?This drug is given as an infusion into a vein. It is administered in a hospital or clinic by a specially trained health care professional. ?Talk to your pediatrician regarding the use of this medicine in children. This medicine is not approved for use in children. ?Overdosage: If you think you have taken too much of this medicine contact a poison control center or emergency room at once. ?NOTE: This medicine is only for you. Do not share this medicine with others. ?What if I miss a dose? ?It is important not to miss a dose. Call your doctor or health care professional if you are unable to keep an appointment. ?What may interact with this medication? ?This medicine may interact with the following medications: ?certain types of chemotherapy, such as daunorubicin, doxorubicin, epirubicin, and idarubicin ?This list may not describe all possible interactions. Give your health care provider a list of all the medicines, herbs, non-prescription drugs, or dietary supplements you use. Also tell them if you smoke, drink alcohol, or use illegal drugs. Some items may interact with your medicine. ?What should I watch for while using this medication? ?Visit your doctor for checks  on your progress. Report any side effects. Continue your course of treatment even though you feel ill unless your doctor tells you to stop. ?Call your doctor or health care professional for advice if you get a fever, chills or sore throat, or other symptoms of a cold or flu. Do not treat yourself. Try to avoid being around people who are sick. ?You may experience fever, chills and shaking during your first infusion. These effects are usually mild and can be treated with other medicines. Report any side effects during the infusion to your health care professional. Fever and chills usually do not happen with later infusions. ?Do not become pregnant while taking this medicine or for 7 months after stopping it. Women should inform their doctor if they wish to become pregnant or think they might be pregnant. Women of child-bearing potential will need to have a negative pregnancy test before starting this medicine. There is a potential for serious side effects to an unborn child. Talk to your health care professional or pharmacist for more information. Do not breast-feed an infant while taking this medicine or for 7 months after stopping it. ?Women must use effective birth control with this medicine. ?What side effects may I notice from receiving this medication? ?Side effects that you should report to your doctor or health care professional as soon as possible: ?allergic reactions like skin rash, itching or hives, swelling of the face, lips, or tongue ?chest pain or palpitations ?cough ?dizziness ?feeling faint or lightheaded, falls ?fever ?general ill feeling or flu-like symptoms ?signs of worsening heart failure like breathing problems; swelling in your legs and feet ?unusually weak or tired ?Side effects that usually   do not require medical attention (report to your doctor or health care professional if they continue or are bothersome): ?bone pain ?changes in taste ?diarrhea ?joint pain ?nausea/vomiting ?weight  loss ?This list may not describe all possible side effects. Call your doctor for medical advice about side effects. You may report side effects to FDA at 1-800-FDA-1088. ?Where should I keep my medication? ?This drug is given in a hospital or clinic and will not be stored at home. ?NOTE: This sheet is a summary. It may not cover all possible information. If you have questions about this medicine, talk to your doctor, pharmacist, or health care provider. ?? 2023 Elsevier/Gold Standard (2016-05-20 00:00:00) ?Pertuzumab injection ?What is this medication? ?PERTUZUMAB (per TOOZ ue mab) is a monoclonal antibody. It is used to treat breast cancer. ?This medicine may be used for other purposes; ask your health care provider or pharmacist if you have questions. ?COMMON BRAND NAME(S): PERJETA ?What should I tell my care team before I take this medication? ?They need to know if you have any of these conditions: ?heart disease ?heart failure ?high blood pressure ?history of irregular heart beat ?recent or ongoing radiation therapy ?an unusual or allergic reaction to pertuzumab, other medicines, foods, dyes, or preservatives ?pregnant or trying to get pregnant ?breast-feeding ?How should I use this medication? ?This medicine is for infusion into a vein. It is given by a health care professional in a hospital or clinic setting. ?Talk to your pediatrician regarding the use of this medicine in children. Special care may be needed. ?Overdosage: If you think you have taken too much of this medicine contact a poison control center or emergency room at once. ?NOTE: This medicine is only for you. Do not share this medicine with others. ?What if I miss a dose? ?It is important not to miss your dose. Call your doctor or health care professional if you are unable to keep an appointment. ?What may interact with this medication? ?Interactions are not expected. ?Give your health care provider a list of all the medicines, herbs,  non-prescription drugs, or dietary supplements you use. Also tell them if you smoke, drink alcohol, or use illegal drugs. Some items may interact with your medicine. ?This list may not describe all possible interactions. Give your health care provider a list of all the medicines, herbs, non-prescription drugs, or dietary supplements you use. Also tell them if you smoke, drink alcohol, or use illegal drugs. Some items may interact with your medicine. ?What should I watch for while using this medication? ?Your condition will be monitored carefully while you are receiving this medicine. Report any side effects. Continue your course of treatment even though you feel ill unless your doctor tells you to stop. ?Do not become pregnant while taking this medicine or for 7 months after stopping it. Women should inform their doctor if they wish to become pregnant or think they might be pregnant. Women of child-bearing potential will need to have a negative pregnancy test before starting this medicine. There is a potential for serious side effects to an unborn child. Talk to your health care professional or pharmacist for more information. Do not breast-feed an infant while taking this medicine or for 7 months after stopping it. ?Women must use effective birth control with this medicine. ?Call your doctor or health care professional for advice if you get a fever, chills or sore throat, or other symptoms of a cold or flu. Do not treat yourself. Try to avoid being around people who  are sick. ?You may experience fever, chills, and headache during the infusion. Report any side effects during the infusion to your health care professional. ?What side effects may I notice from receiving this medication? ?Side effects that you should report to your doctor or health care professional as soon as possible: ?breathing problems ?chest pain or palpitations ?dizziness ?feeling faint or lightheaded ?fever or chills ?skin rash, itching or  hives ?sore throat ?swelling of the face, lips, or tongue ?swelling of the legs or ankles ?unusually weak or tired ?Side effects that usually do not require medical attention (report to your doctor or health care professional i

## 2021-09-20 ENCOUNTER — Other Ambulatory Visit: Payer: Self-pay | Admitting: Oncology

## 2021-09-20 ENCOUNTER — Inpatient Hospital Stay: Payer: Medicare PPO | Attending: Oncology | Admitting: Oncology

## 2021-09-20 ENCOUNTER — Inpatient Hospital Stay: Payer: Medicare PPO

## 2021-09-20 VITALS — BP 146/70 | HR 92 | Temp 98.1°F | Resp 18 | Ht 61.0 in | Wt 143.7 lb

## 2021-09-20 DIAGNOSIS — C50919 Malignant neoplasm of unspecified site of unspecified female breast: Secondary | ICD-10-CM | POA: Diagnosis not present

## 2021-09-20 DIAGNOSIS — C50212 Malignant neoplasm of upper-inner quadrant of left female breast: Secondary | ICD-10-CM | POA: Insufficient documentation

## 2021-09-20 DIAGNOSIS — K2100 Gastro-esophageal reflux disease with esophagitis, without bleeding: Secondary | ICD-10-CM

## 2021-09-20 DIAGNOSIS — B3781 Candidal esophagitis: Secondary | ICD-10-CM

## 2021-09-20 DIAGNOSIS — Z170 Estrogen receptor positive status [ER+]: Secondary | ICD-10-CM

## 2021-09-20 DIAGNOSIS — Z5112 Encounter for antineoplastic immunotherapy: Secondary | ICD-10-CM | POA: Insufficient documentation

## 2021-09-20 LAB — CBC AND DIFFERENTIAL
HCT: 36 (ref 36–46)
Hemoglobin: 11.7 — AB (ref 12.0–16.0)
Neutrophils Absolute: 4.5
Platelets: 279 10*3/uL (ref 150–400)
WBC: 6

## 2021-09-20 LAB — CBC: RBC: 3.6 — AB (ref 3.87–5.11)

## 2021-09-20 LAB — BASIC METABOLIC PANEL
BUN: 16 (ref 4–21)
CO2: 31 — AB (ref 13–22)
Chloride: 100 (ref 99–108)
Creatinine: 0.7 (ref 0.5–1.1)
Glucose: 156
Potassium: 3.6 mEq/L (ref 3.5–5.1)
Sodium: 140 (ref 137–147)

## 2021-09-20 LAB — HEPATIC FUNCTION PANEL
ALT: 34 U/L (ref 7–35)
AST: 27 (ref 13–35)
Alkaline Phosphatase: 79 (ref 25–125)
Bilirubin, Total: 0.7

## 2021-09-20 LAB — COMPREHENSIVE METABOLIC PANEL
Albumin: 4.7 (ref 3.5–5.0)
Calcium: 9.9 (ref 8.7–10.7)

## 2021-09-20 MED ORDER — OMEPRAZOLE 40 MG PO CPDR: 40.0000 mg | DELAYED_RELEASE_CAPSULE | Freq: Every day | ORAL | 5 refills | Status: DC

## 2021-09-20 MED ORDER — FLUCONAZOLE 100 MG PO TABS: 100.0000 mg | ORAL_TABLET | Freq: Every day | ORAL | 0 refills | Status: DC

## 2021-09-20 NOTE — Progress Notes (Signed)
Patient Care Team: [name], [place] as PCP - General (Family Medicine) [name], MD as Consulting Physician (Oncology) [name], MD as Referring Physician (Endocrinology) [name], DO as Consulting Physician (Cardiology) [name], MD as Consulting Physician (General Surgery) [name], MD as Consulting Physician (Plastic Surgery)  Clinic Day:  09/20/21  Referring physician: [name], FNP  ASSESSMENT & PLAN:   Assessment & Plan: Local recurrence of cancer of left breast 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 This is felt to most likely represent a recurrence rather than a 2nd breast primary.  However, this is now HER 2 positive in addition to ER positive.  She completed 6 cycles of neoadjuvant TCHP in July.  After 4 cycles, the doses were reduced by 25%, but she still was experiencing adverse effects. She completed chemotherapy. She received three cycles of HER2 targeted therapy prior to reconstructive surgery. She was due for 11 postop doses of Herceptin/Perjeta. She will proceed with cycle 9 next week.   Assessment:  1.  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 This is felt to most likely represent a recurrence rather than a 2nd breast primary.  However, this is now HER 2 positive in addition to ER positive.  She completed 6 cycles of neoadjuvant TCHP in July.  After 4 cycles, the doses were reduced by 25%, but she still was experiencing adverse effects. She completed chemotherapy. She received three cycles of HER2 targeted therapy prior  to reconstructive surgery. She was due for 11 postop doses of Herceptin/Perjeta, and this will be cycle 9.     2.  Significant arthralgias, especially of the knees.  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 This is better controlled with cortisone injections and Mobic 15 mg daily. She is on physical therapy. The orthopedic surgeon has explained to her that this this not caused by her current treatments.   3.  ATM gene mutation.  She has had bilateral mastectomies.  She is also at increased risk for pancreatic cancer but there are no standardized screening methods.    She will proceed with Herceptin and Perjeta next week and return in 3 weeks with CBC and CMP prior to cycle 10.  I will refill a prescription for Protonix 40 mg and also for fluconazole 100 mg daily for 10 days. The patient understands the plans discussed today and is in agreement with them.  She knows to contact our office if she develops concerns prior to her next appointment.    [name], MD  [facility] AT [facility] [address] [place] [address] Dept: [phone] Dept Fax: [phone]       CHIEF COMPLAINT:  CC: A 71 year old female with history of breast cancer here for 3 week evaluation.  Current Treatment:  Trastuzumab/ Pertuzumab  INTERVAL HISTORY:  Tammy Fowler is here today for repeat clinical assessment.  She has severe pain in both knees and was told that she has bone-on-bone at this time.  The only thing they can offer would be a knee replacement and she will need both done.  She also complains  of acid reflux and feeling of a knot in her throat at night.  Her hemoglobin has come up a little bit from 11.5 to 11.7 with normal white count and normal platelet count.  Comprehensive metabolic profile is normal other than a nonfasting blood sugar of 156.  She denies fevers or chills. She denies pain. Her appetite is good. Her weight has been  stable.  I have reviewed the past medical history, past surgical history, social history and family history with the patient and they are unchanged from previous note.  ALLERGIES:  is allergic to oxycodone, codeine, and lisinopril.  MEDICATIONS:  Current Outpatient Medications  Medication Sig Dispense Refill   ACCU-CHEK GUIDE test strip daily. for testing as directed     aspirin 81 MG chewable tablet Chew by mouth.     benzonatate (TESSALON) 100 MG capsule  (Patient not taking: Reported on 08/30/2021)     calcium-vitamin D 250-100 MG-UNIT tablet Take by mouth.     celecoxib (CELEBREX) 100 MG capsule Take by mouth. (Patient not taking: Reported on 08/30/2021)     diphenoxylate-atropine (LOMOTIL) 2.5-0.025 MG tablet Take 2 tablets by mouth 4 (four) times daily as needed for diarrhea or loose stools. (Patient not taking: Reported on 08/30/2021) 60 tablet 3   Docusate Sodium (DSS) 100 MG CAPS Take by mouth. (Patient not taking: Reported on 08/30/2021)     DULoxetine (CYMBALTA) 60 MG capsule Take 1 capsule by mouth daily.     fluconazole (DIFLUCAN) 100 MG tablet Take 1 tablet (100 mg total) by mouth daily. 20 tablet 0   FLUoxetine (PROZAC) 40 MG capsule Take by mouth.     gabapentin (NEURONTIN) 300 MG capsule TAKE 1 CAPSULE BY MOUTH THREE TIMES A DAY 90 capsule 0   HYDROcodone-acetaminophen (NORCO) 7.5-325 MG tablet TAKE 1 TABLET BY MOUTH EVERY 4 HOURS AS NEEDED FOR MODERATE PAIN 120 tablet 0   Hylan (SYNVISC ONE) 48 MG/6ML SOSY PROVIDER/OFFICE TO INJECT 1 (48MG/6ML) PFS INTRA-ARTICULARLY INTO AFFECTED RIGHT & LEFT  KNEE, x once as directed; Q 58MO PRN     loperamide (IMODIUM A-D) 2 MG tablet Take 2 mg by mouth 4 (four) times daily as needed for diarrhea or loose stools.     LORazepam (ATIVAN) 0.5 MG tablet Take 1 tablet (0.5 mg total) by mouth every 6 (six) hours as needed for anxiety. 90 tablet 0   losartan (COZAAR) 50 MG tablet daily.     magnesium oxide (MAG-OX) 400 (241.3 Mg) MG tablet Take 1  tablet (400 mg total) by mouth 2 (two) times daily. (Patient not taking: Reported on 08/07/2021) 60 tablet 1   meclizine (ANTIVERT) 25 MG tablet Take 1 tablet (25 mg total) by mouth 3 (three) times daily as needed for dizziness. (Patient not taking: Reported on 08/30/2021) 30 tablet 0   meloxicam (MOBIC) 15 MG tablet Take 1 tablet (15 mg total) by mouth daily. 30 tablet 5   metFORMIN (GLUCOPHAGE) 500 MG tablet 2 (two) times daily.     methocarbamol (ROBAXIN) 500 MG tablet Take 500 mg by mouth 3 (three) times daily. (Patient not taking: Reported on 08/30/2021)     Multiple Vitamins-Minerals (THERA-M) TABS Take by mouth.     omeprazole (PRILOSEC) 40 MG capsule Take 1 capsule (40 mg total) by mouth daily. 30 capsule 5   ondansetron (ZOFRAN) 4 MG tablet Take 1 tablet (4 mg total) by mouth every 8 (eight) hours as needed for nausea or vomiting. 20 tablet 0   prochlorperazine (COMPAZINE) 10  MG tablet Take 1 tablet (10 mg total) by mouth every 6 (six) hours as needed (Nausea or vomiting). 30 tablet 1   rosuvastatin (CRESTOR) 40 MG tablet daily.     traMADol (ULTRAM) 50 MG tablet Take 1-2 tablets (50-100 mg total) by mouth every 6 (six) hours as needed. 60 tablet 0   vitamin B-12 (CYANOCOBALAMIN) 500 MCG tablet Take 500 mcg by mouth daily. (Patient not taking: Reported on 08/30/2021)     Vitamin D, Ergocalciferol, (DRISDOL) 1.25 MG (50000 UNIT) CAPS capsule Take 50,000 Units by mouth 2 (two) times a week. (Patient not taking: Reported on 08/30/2021)     No current facility-administered medications for this visit.   Facility-Administered Medications Ordered in Other Visits  Medication Dose Route Frequency Provider Last Rate Last Admin   acetaminophen (TYLENOL) tablet 650 mg  650 mg Oral Once [name], MD       diphenhydrAMINE (BENADRYL) capsule 25 mg  25 mg Oral Once [name], MD       sodium chloride flush (NS) 0.9 % injection 10 mL  10 mL Intracatheter PRN [name], MD    10 mL at 01/14/21 1624    HISTORY OF PRESENT ILLNESS:   Oncology History  Breast cancer in female [facility])  11/03/2002 Initial Diagnosis   Breast cancer in female [facility])    11/03/2002 Cancer Staging   Staging form: Breast, AJCC 6th Edition - Clinical stage from 11/03/2002: Stage IIIA (T2, N2, M0) - Signed by [name], MD on 06/04/2020 Staging comments: Treated with AC x4, Taxol x4, anastrazole x 10 years, completed April 2015 Prognostic indicators: ER/PR pos, HER2 neg, age 78 at dx, had re-excision for pos margin    Local recurrence of cancer of left breast ([place])  06/08/2020 Initial Diagnosis   Local recurrence of cancer of right breast ([place])    06/20/2020 Cancer Staging   Staging form: Breast, AJCC 8th Edition - Clinical stage from 06/20/2020: Stage IIA (rcT2, cN0, cM0, G3, ER+, PR-, HER2+) - Signed by [name], MD on 06/27/2020 Histopathologic type: Carcinoma, NOS Stage prefix: Recurrence Method of lymph node assessment: Clinical Nuclear grade: G3 Multigene prognostic tests performed: None Menopausal status: Postmenopausal Ki-67 (%): 25 Stage used in treatment planning: Yes National guidelines used in treatment planning: Yes Type of national guideline used in treatment planning: NCCN Staging comments: 3.2 total size, incl satellite lesion.  Arising in adipose tissue so susp for recurrence but now HER @ positive    07/03/2020 - 10/22/2020 Chemotherapy    Patient is on Treatment Plan: BREAST TRASTUZUMAB + PERTUZUMAB Q21D X 11 CYCLES       11/08/2020 -  Chemotherapy   Patient is on Treatment Plan : BREAST Trastuzumab + Pertuzumab q21d x 11 cycles      12/13/2020 Cancer Staging   Staging form: Breast, AJCC 8th Edition - Pathologic stage from 12/13/2020: No Stage Recommended (ypT1c, pN0, cM0, G2, ER+, PR-, HER2+) - Signed by [name], MD on 01/10/2021 Histopathologic type: Carcinoma, NOS Stage prefix: Post-therapy Response to neoadjuvant therapy:  Partial response Method of lymph node assessment: Clinical Nuclear grade: G2 Multigene prognostic tests performed: None Histologic grading system: 3 grade system Residual tumor (R): R0 - None Laterality: Left Tumor size (mm): 15 Lymph-vascular invasion (LVI): LVI not present (absent)/not identified Diagnostic confirmation: Positive histology Specimen type: Excision Staged by: Managing physician Menopausal status: Postmenopausal Ki-67 (%): 25 Stage used in treatment planning: Yes National guidelines used in treatment planning: Yes  Type of national guideline used in treatment planning: NCCN    Hypomagnesemia      REVIEW OF SYSTEMS:   Constitutional: Denies fevers, chills or abnormal weight loss Eyes: Denies blurriness of vision Ears, nose, mouth, throat, and face: Denies mucositis or sore throat Respiratory: Denies cough, dyspnea or wheezes Cardiovascular: Denies palpitation, chest discomfort or lower extremity swelling Gastrointestinal:  Denies nausea, heartburn or change in bowel habits Skin: Denies abnormal skin rashes Lymphatics: Denies new lymphadenopathy or easy bruising Neurological:Denies numbness, tingling or new weaknesses Behavioral/Psych: Mood is stable, no new changes  All other systems were reviewed with the patient and are negative.   VITALS:  Blood pressure (!) 146/70, pulse 92, temperature 98.1 F (36.7 C), temperature source Oral, resp. rate 18, height 5' 1"  (1.549 m), weight 143 lb 11.2 oz (65.2 kg), SpO2 97 %.  Wt Readings from Last 3 Encounters:  09/23/21 143 lb (64.9 kg)  09/20/21 143 lb 11.2 oz (65.2 kg)  09/02/21 143 lb (64.9 kg)    Body mass index is 27.15 kg/m.  Performance status (ECOG): 1 - Symptomatic but completely ambulatory  PHYSICAL EXAM:   GENERAL:alert, no distress and comfortable SKIN: skin color, texture, turgor are normal, no rashes or significant lesions EYES: normal, Conjunctiva are pink and non-injected, sclera  clear OROPHARYNX:no exudate, no erythema and lips, buccal mucosa, and tongue normal  NECK: supple, thyroid normal size, non-tender, without nodularity LYMPH:  no palpable lymphadenopathy in the cervical, axillary or inguinal BREASTS:  Bilateral reconstructions are negative. LUNGS: clear to auscultation and percussion with normal breathing effort HEART: regular rate & rhythm and no murmurs and no lower extremity edema ABDOMEN:abdomen soft, non-tender and normal bowel sounds Musculoskeletal:no cyanosis of digits and no clubbing  NEURO: alert & oriented x 3 with fluent speech, no focal motor/sensory deficits  LABORATORY DATA:  I have reviewed the data as listed    Component Value Date/Time   NA 140 09/20/2021 0000   K 3.6 09/20/2021 0000   CL 100 09/20/2021 0000   CO2 31 (A) 09/20/2021 0000   GLUCOSE 199 (H) 04/25/2021 1035   BUN 16 09/20/2021 0000   CREATININE 0.7 09/20/2021 0000   CREATININE 0.69 04/25/2021 1035   CALCIUM 9.9 09/20/2021 0000   ALBUMIN 4.7 09/20/2021 0000   AST 27 09/20/2021 0000   ALT 34 09/20/2021 0000   ALKPHOS 79 09/20/2021 0000   GFRNONAA >60 04/25/2021 1035    No results found for: SPEP, UPEP  Lab Results  Component Value Date   WBC 6.0 09/20/2021   NEUTROABS 4.50 09/20/2021   HGB 11.7 (A) 09/20/2021   HCT 36 09/20/2021   MCV 101 (A) 06/07/2021   PLT 279 09/20/2021      Chemistry      Component Value Date/Time   NA 140 09/20/2021 0000   K 3.6 09/20/2021 0000   CL 100 09/20/2021 0000   CO2 31 (A) 09/20/2021 0000   BUN 16 09/20/2021 0000   CREATININE 0.7 09/20/2021 0000   CREATININE 0.69 04/25/2021 1035   GLU 156 09/20/2021 0000      Component Value Date/Time   CALCIUM 9.9 09/20/2021 0000   ALKPHOS 79 09/20/2021 0000   AST 27 09/20/2021 0000   ALT 34 09/20/2021 0000       RADIOGRAPHIC STUDIES: No results found.

## 2021-09-23 ENCOUNTER — Inpatient Hospital Stay: Payer: Medicare PPO

## 2021-09-23 VITALS — BP 124/71 | HR 99 | Temp 97.9°F | Resp 18 | Ht 61.0 in | Wt 143.0 lb

## 2021-09-23 DIAGNOSIS — Z170 Estrogen receptor positive status [ER+]: Secondary | ICD-10-CM | POA: Diagnosis not present

## 2021-09-23 DIAGNOSIS — C50912 Malignant neoplasm of unspecified site of left female breast: Secondary | ICD-10-CM

## 2021-09-23 DIAGNOSIS — C50212 Malignant neoplasm of upper-inner quadrant of left female breast: Secondary | ICD-10-CM | POA: Diagnosis present

## 2021-09-23 DIAGNOSIS — Z5112 Encounter for antineoplastic immunotherapy: Secondary | ICD-10-CM | POA: Diagnosis present

## 2021-09-23 MED ORDER — DIPHENHYDRAMINE HCL 25 MG PO CAPS
25.0000 mg | ORAL_CAPSULE | Freq: Once | ORAL | Status: AC
  Administered 2021-09-23: 25 mg via ORAL
  Filled 2021-09-23: qty 1

## 2021-09-23 MED ORDER — TRASTUZUMAB-ANNS CHEMO 150 MG IV SOLR
6.0000 mg/kg | Freq: Once | INTRAVENOUS | Status: AC
  Administered 2021-09-23: 420 mg via INTRAVENOUS
  Filled 2021-09-23: qty 20

## 2021-09-23 MED ORDER — SODIUM CHLORIDE 0.9% FLUSH
10.0000 mL | INTRAVENOUS | Status: DC | PRN
  Administered 2021-09-23: 10 mL

## 2021-09-23 MED ORDER — HEPARIN SOD (PORK) LOCK FLUSH 100 UNIT/ML IV SOLN
500.0000 [IU] | Freq: Once | INTRAVENOUS | Status: AC | PRN
  Administered 2021-09-23: 500 [IU]

## 2021-09-23 MED ORDER — ACETAMINOPHEN 325 MG PO TABS
650.0000 mg | ORAL_TABLET | Freq: Once | ORAL | Status: AC
  Administered 2021-09-23: 650 mg via ORAL
  Filled 2021-09-23: qty 2

## 2021-09-23 MED ORDER — SODIUM CHLORIDE 0.9 % IV SOLN
420.0000 mg | Freq: Once | INTRAVENOUS | Status: AC
  Administered 2021-09-23: 420 mg via INTRAVENOUS
  Filled 2021-09-23: qty 14

## 2021-09-23 MED ORDER — SODIUM CHLORIDE 0.9 % IV SOLN: Freq: Once | INTRAVENOUS | Status: AC

## 2021-09-23 NOTE — Patient Instructions (Signed)
Pertuzumab injection ?What is this medication? ?PERTUZUMAB (per TOOZ ue mab) is a monoclonal antibody. It is used to treat breast cancer. ?This medicine may be used for other purposes; ask your health care provider or pharmacist if you have questions. ?COMMON BRAND NAME(S): PERJETA ?What should I tell my care team before I take this medication? ?They need to know if you have any of these conditions: ?heart disease ?heart failure ?high blood pressure ?history of irregular heart beat ?recent or ongoing radiation therapy ?an unusual or allergic reaction to pertuzumab, other medicines, foods, dyes, or preservatives ?pregnant or trying to get pregnant ?breast-feeding ?How should I use this medication? ?This medicine is for infusion into a vein. It is given by a health care professional in a hospital or clinic setting. ?Talk to your pediatrician regarding the use of this medicine in children. Special care may be needed. ?Overdosage: If you think you have taken too much of this medicine contact a poison control center or emergency room at once. ?NOTE: This medicine is only for you. Do not share this medicine with others. ?What if I miss a dose? ?It is important not to miss your dose. Call your doctor or health care professional if you are unable to keep an appointment. ?What may interact with this medication? ?Interactions are not expected. ?Give your health care provider a list of all the medicines, herbs, non-prescription drugs, or dietary supplements you use. Also tell them if you smoke, drink alcohol, or use illegal drugs. Some items may interact with your medicine. ?This list may not describe all possible interactions. Give your health care provider a list of all the medicines, herbs, non-prescription drugs, or dietary supplements you use. Also tell them if you smoke, drink alcohol, or use illegal drugs. Some items may interact with your medicine. ?What should I watch for while using this medication? ?Your condition  will be monitored carefully while you are receiving this medicine. Report any side effects. Continue your course of treatment even though you feel ill unless your doctor tells you to stop. ?Do not become pregnant while taking this medicine or for 7 months after stopping it. Women should inform their doctor if they wish to become pregnant or think they might be pregnant. Women of child-bearing potential will need to have a negative pregnancy test before starting this medicine. There is a potential for serious side effects to an unborn child. Talk to your health care professional or pharmacist for more information. Do not breast-feed an infant while taking this medicine or for 7 months after stopping it. ?Women must use effective birth control with this medicine. ?Call your doctor or health care professional for advice if you get a fever, chills or sore throat, or other symptoms of a cold or flu. Do not treat yourself. Try to avoid being around people who are sick. ?You may experience fever, chills, and headache during the infusion. Report any side effects during the infusion to your health care professional. ?What side effects may I notice from receiving this medication? ?Side effects that you should report to your doctor or health care professional as soon as possible: ?breathing problems ?chest pain or palpitations ?dizziness ?feeling faint or lightheaded ?fever or chills ?skin rash, itching or hives ?sore throat ?swelling of the face, lips, or tongue ?swelling of the legs or ankles ?unusually weak or tired ?Side effects that usually do not require medical attention (report to your doctor or health care professional if they continue or are bothersome): ?diarrhea ?hair loss ?  nausea, vomiting ?tiredness ?This list may not describe all possible side effects. Call your doctor for medical advice about side effects. You may report side effects to FDA at 1-800-FDA-1088. ?Where should I keep my medication? ?This drug is  given in a hospital or clinic and will not be stored at home. ?NOTE: This sheet is a summary. It may not cover all possible information. If you have questions about this medicine, talk to your doctor, pharmacist, or health care provider. ?? 2023 Elsevier/Gold Standard (2015-06-07 00:00:00) ?Trastuzumab injection for infusion ?What is this medication? ?TRASTUZUMAB (tras TOO zoo mab) is a monoclonal antibody. It is used to treat breast cancer and stomach cancer. ?This medicine may be used for other purposes; ask your health care provider or pharmacist if you have questions. ?COMMON BRAND NAME(S): Herceptin, Belenda Cruise, Ogivri, Ontruzant, Trazimera ?What should I tell my care team before I take this medication? ?They need to know if you have any of these conditions: ?heart disease ?heart failure ?lung or breathing disease, like asthma ?an unusual or allergic reaction to trastuzumab, benzyl alcohol, or other medications, foods, dyes, or preservatives ?pregnant or trying to get pregnant ?breast-feeding ?How should I use this medication? ?This drug is given as an infusion into a vein. It is administered in a hospital or clinic by a specially trained health care professional. ?Talk to your pediatrician regarding the use of this medicine in children. This medicine is not approved for use in children. ?Overdosage: If you think you have taken too much of this medicine contact a poison control center or emergency room at once. ?NOTE: This medicine is only for you. Do not share this medicine with others. ?What if I miss a dose? ?It is important not to miss a dose. Call your doctor or health care professional if you are unable to keep an appointment. ?What may interact with this medication? ?This medicine may interact with the following medications: ?certain types of chemotherapy, such as daunorubicin, doxorubicin, epirubicin, and idarubicin ?This list may not describe all possible interactions. Give your health care  provider a list of all the medicines, herbs, non-prescription drugs, or dietary supplements you use. Also tell them if you smoke, drink alcohol, or use illegal drugs. Some items may interact with your medicine. ?What should I watch for while using this medication? ?Visit your doctor for checks on your progress. Report any side effects. Continue your course of treatment even though you feel ill unless your doctor tells you to stop. ?Call your doctor or health care professional for advice if you get a fever, chills or sore throat, or other symptoms of a cold or flu. Do not treat yourself. Try to avoid being around people who are sick. ?You may experience fever, chills and shaking during your first infusion. These effects are usually mild and can be treated with other medicines. Report any side effects during the infusion to your health care professional. Fever and chills usually do not happen with later infusions. ?Do not become pregnant while taking this medicine or for 7 months after stopping it. Women should inform their doctor if they wish to become pregnant or think they might be pregnant. Women of child-bearing potential will need to have a negative pregnancy test before starting this medicine. There is a potential for serious side effects to an unborn child. Talk to your health care professional or pharmacist for more information. Do not breast-feed an infant while taking this medicine or for 7 months after stopping it. ?Women must use  effective birth control with this medicine. ?What side effects may I notice from receiving this medication? ?Side effects that you should report to your doctor or health care professional as soon as possible: ?allergic reactions like skin rash, itching or hives, swelling of the face, lips, or tongue ?chest pain or palpitations ?cough ?dizziness ?feeling faint or lightheaded, falls ?fever ?general ill feeling or flu-like symptoms ?signs of worsening heart failure like breathing  problems; swelling in your legs and feet ?unusually weak or tired ?Side effects that usually do not require medical attention (report to your doctor or health care professional if they continue or are botherso

## 2021-09-25 ENCOUNTER — Encounter: Payer: Self-pay | Admitting: Oncology

## 2021-10-06 ENCOUNTER — Encounter: Payer: Self-pay | Admitting: Oncology

## 2021-10-07 ENCOUNTER — Other Ambulatory Visit: Payer: Self-pay | Admitting: Pharmacist

## 2021-10-10 NOTE — Progress Notes (Unsigned)
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 This is felt to most likely represent a recurrence rather than a 2nd breast primary.  However, this is now HER 2 positive in addition to ER positive.  She completed 6 cycles of neoadjuvant TCHP in July.  After 4 cycles, the doses were reduced by 25%, but she still was experiencing adverse effects. She completed chemotherapy. She received three cycles of HER2 targeted therapy prior to reconstructive surgery. She was due for 11 postop doses of Herceptin/Perjeta. She will proceed with cycle 9 next week.   Assessment:  1.  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 This is felt to most likely represent a recurrence rather than a 2nd breast primary.  However, this is now HER 2 positive in addition to ER positive.  She completed 6 cycles of neoadjuvant TCHP in July.  After 4 cycles, the doses were reduced by 25%, but she still was experiencing adverse effects. She completed chemotherapy. She received three cycles of HER2 targeted therapy prior  to reconstructive surgery. She was due for 11 postop doses of Herceptin/Perjeta, and this will be cycle 9.     2.  Significant arthralgias, especially of the knees.  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 This is better controlled with cortisone injections and Mobic 15 mg daily. She is on physical therapy. The orthopedic surgeon has explained to her that this this not caused by her current treatments.   3.  ATM gene mutation.  She has had bilateral mastectomies.  She is also at increased risk for pancreatic cancer but there are no standardized screening methods.    She will proceed with Herceptin and Perjeta next week and return in 3 weeks with CBC and CMP prior to cycle 10.  I will refill a prescription for Protonix 40 mg and also for fluconazole 100 mg daily for 10 days. The patient understands the plans discussed today and is in agreement with them.  She knows to contact our office if she develops concerns prior to her next appointment.    [name], MD  [facility] [address] [place] [address] Dept: [phone] Dept Fax: [phone]       CHIEF COMPLAINT:  CC: A 71 year old female with history of breast cancer here for 3 week evaluation.  Current Treatment:  Trastuzumab/ Pertuzumab  INTERVAL HISTORY:  Fareeda is here today for repeat clinical assessment.  She has severe pain in both knees and was told that she has bone-on-bone at this time.  The only thing they can offer would be a knee replacement and she will need both done.  She also complains  of acid reflux and feeling of a knot in her throat at night.  Her hemoglobin has come up a little bit from 11.5 to 11.7 with normal white count and normal platelet count.  Comprehensive metabolic profile is normal other than a nonfasting blood sugar of 156.  She denies fevers or chills. She denies pain. Her appetite is good. Her weight has been  stable.  I have reviewed the past medical history, past surgical history, social history and family history with the patient and they are unchanged from previous note.  ALLERGIES:  is allergic to oxycodone, codeine, and lisinopril.  MEDICATIONS:  Current Outpatient Medications  Medication Sig Dispense Refill   ACCU-CHEK GUIDE test strip daily. for testing as directed     aspirin 81 MG chewable tablet Chew by mouth.     benzonatate (TESSALON) 100 MG capsule  (Patient not taking: Reported on 08/30/2021)     calcium-vitamin D 250-100 MG-UNIT tablet Take by mouth.     celecoxib (CELEBREX) 100 MG capsule Take by mouth. (Patient not taking: Reported on 08/30/2021)     diphenoxylate-atropine (LOMOTIL) 2.5-0.025 MG tablet Take 2 tablets by mouth 4 (four) times daily as needed for diarrhea or loose stools. (Patient not taking: Reported on 08/30/2021) 60 tablet 3   Docusate Sodium (DSS) 100 MG CAPS Take by mouth. (Patient not taking: Reported on 08/30/2021)     DULoxetine (CYMBALTA) 60 MG capsule Take 1 capsule by mouth daily.     fluconazole (DIFLUCAN) 100 MG tablet Take 1 tablet (100 mg total) by mouth daily. 20 tablet 0   FLUoxetine (PROZAC) 40 MG capsule Take by mouth.     gabapentin (NEURONTIN) 300 MG capsule TAKE 1 CAPSULE BY MOUTH THREE TIMES A DAY 90 capsule 0   HYDROcodone-acetaminophen (NORCO) 7.5-325 MG tablet TAKE 1 TABLET BY MOUTH EVERY 4 HOURS AS NEEDED FOR MODERATE PAIN 120 tablet 0   Hylan (SYNVISC ONE) 48 MG/6ML SOSY PROVIDER/OFFICE TO INJECT 1 (48MG/6ML) PFS INTRA-ARTICULARLY INTO AFFECTED RIGHT & LEFT  KNEE, x once as directed; Q 58MO PRN     loperamide (IMODIUM A-D) 2 MG tablet Take 2 mg by mouth 4 (four) times daily as needed for diarrhea or loose stools.     LORazepam (ATIVAN) 0.5 MG tablet Take 1 tablet (0.5 mg total) by mouth every 6 (six) hours as needed for anxiety. 90 tablet 0   losartan (COZAAR) 50 MG tablet daily.     magnesium oxide (MAG-OX) 400 (241.3 Mg) MG tablet Take 1  tablet (400 mg total) by mouth 2 (two) times daily. (Patient not taking: Reported on 08/07/2021) 60 tablet 1   meclizine (ANTIVERT) 25 MG tablet Take 1 tablet (25 mg total) by mouth 3 (three) times daily as needed for dizziness. (Patient not taking: Reported on 08/30/2021) 30 tablet 0   meloxicam (MOBIC) 15 MG tablet Take 1 tablet (15 mg total) by mouth daily. 30 tablet 5   metFORMIN (GLUCOPHAGE) 500 MG tablet 2 (two) times daily.     methocarbamol (ROBAXIN) 500 MG tablet Take 500 mg by mouth 3 (three) times daily. (Patient not taking: Reported on 08/30/2021)     Multiple Vitamins-Minerals (THERA-M) TABS Take by mouth.     omeprazole (PRILOSEC) 40 MG capsule Take 1 capsule (40 mg total) by mouth daily. 30 capsule 5   ondansetron (ZOFRAN) 4 MG tablet Take 1 tablet (4 mg total) by mouth every 8 (eight) hours as needed for nausea or vomiting. 20 tablet 0   prochlorperazine (COMPAZINE) 10  MG tablet Take 1 tablet (10 mg total) by mouth every 6 (six) hours as needed (Nausea or vomiting). 30 tablet 1   rosuvastatin (CRESTOR) 40 MG tablet daily.     traMADol (ULTRAM) 50 MG tablet Take 1-2 tablets (50-100 mg total) by mouth every 6 (six) hours as needed. 60 tablet 0   vitamin B-12 (CYANOCOBALAMIN) 500 MCG tablet Take 500 mcg by mouth daily. (Patient not taking: Reported on 08/30/2021)     Vitamin D, Ergocalciferol, (DRISDOL) 1.25 MG (50000 UNIT) CAPS capsule Take 50,000 Units by mouth 2 (two) times a week. (Patient not taking: Reported on 08/30/2021)     No current facility-administered medications for this visit.   Facility-Administered Medications Ordered in Other Visits  Medication Dose Route Frequency Provider Last Rate Last Admin   acetaminophen (TYLENOL) tablet 650 mg  650 mg Oral Once [name], MD       diphenhydrAMINE (BENADRYL) capsule 25 mg  25 mg Oral Once [name], MD       sodium chloride flush (NS) 0.9 % injection 10 mL  10 mL Intracatheter PRN [name], MD    10 mL at 01/14/21 1624    HISTORY OF PRESENT ILLNESS:   Oncology History  Breast cancer in female [facility])  11/03/2002 Initial Diagnosis   Breast cancer in female [facility])    11/03/2002 Cancer Staging   Staging form: Breast, AJCC 6th Edition - Clinical stage from 11/03/2002: Stage IIIA (T2, N2, M0) - Signed by [name], MD on 06/04/2020 Staging comments: Treated with AC x4, Taxol x4, anastrazole x 10 years, completed April 2015 Prognostic indicators: ER/PR pos, HER2 neg, age 25 at dx, had re-excision for pos margin    Local recurrence of cancer of left breast ([place])  06/08/2020 Initial Diagnosis   Local recurrence of cancer of right breast ([place])    06/20/2020 Cancer Staging   Staging form: Breast, AJCC 8th Edition - Clinical stage from 06/20/2020: Stage IIA (rcT2, cN0, cM0, G3, ER+, PR-, HER2+) - Signed by [name], MD on 06/27/2020 Histopathologic type: Carcinoma, NOS Stage prefix: Recurrence Method of lymph node assessment: Clinical Nuclear grade: G3 Multigene prognostic tests performed: None Menopausal status: Postmenopausal Ki-67 (%): 25 Stage used in treatment planning: Yes National guidelines used in treatment planning: Yes Type of national guideline used in treatment planning: NCCN Staging comments: 3.2 total size, incl satellite lesion.  Arising in adipose tissue so susp for recurrence but now HER @ positive    07/03/2020 - 10/22/2020 Chemotherapy    Patient is on Treatment Plan: BREAST TRASTUZUMAB + PERTUZUMAB Q21D X 11 CYCLES       11/08/2020 -  Chemotherapy   Patient is on Treatment Plan : BREAST Trastuzumab + Pertuzumab q21d x 11 cycles      12/13/2020 Cancer Staging   Staging form: Breast, AJCC 8th Edition - Pathologic stage from 12/13/2020: No Stage Recommended (ypT1c, pN0, cM0, G2, ER+, PR-, HER2+) - Signed by [name], MD on 01/10/2021 Histopathologic type: Carcinoma, NOS Stage prefix: Post-therapy Response to neoadjuvant therapy:  Partial response Method of lymph node assessment: Clinical Nuclear grade: G2 Multigene prognostic tests performed: None Histologic grading system: 3 grade system Residual tumor (R): R0 - None Laterality: Left Tumor size (mm): 15 Lymph-vascular invasion (LVI): LVI not present (absent)/not identified Diagnostic confirmation: Positive histology Specimen type: Excision Staged by: Managing physician Menopausal status: Postmenopausal Ki-67 (%): 25 Stage used in treatment planning: Yes National guidelines used in treatment planning: Yes  Type of national guideline used in treatment planning: NCCN    Hypomagnesemia      REVIEW OF SYSTEMS:   Constitutional: Denies fevers, chills or abnormal weight loss Eyes: Denies blurriness of vision Ears, nose, mouth, throat, and face: Denies mucositis or sore throat Respiratory: Denies cough, dyspnea or wheezes Cardiovascular: Denies palpitation, chest discomfort or lower extremity swelling Gastrointestinal:  Denies nausea, heartburn or change in bowel habits Skin: Denies abnormal skin rashes Lymphatics: Denies new lymphadenopathy or easy bruising Neurological:Denies numbness, tingling or new weaknesses Behavioral/Psych: Mood is stable, no new changes  All other systems were reviewed with the patient and are negative.   VITALS:  There were no vitals taken for this visit.  Wt Readings from Last 3 Encounters:  09/23/21 143 lb (64.9 kg)  09/20/21 143 lb 11.2 oz (65.2 kg)  09/02/21 143 lb (64.9 kg)    There is no height or weight on file to calculate BMI.  Performance status (ECOG): 1 - Symptomatic but completely ambulatory  PHYSICAL EXAM:   GENERAL:alert, no distress and comfortable SKIN: skin color, texture, turgor are normal, no rashes or significant lesions EYES: normal, Conjunctiva are pink and non-injected, sclera clear OROPHARYNX:no exudate, no erythema and lips, buccal mucosa, and tongue normal  NECK: supple, thyroid normal size,  non-tender, without nodularity LYMPH:  no palpable lymphadenopathy in the cervical, axillary or inguinal BREASTS:  Bilateral reconstructions are negative. LUNGS: clear to auscultation and percussion with normal breathing effort HEART: regular rate & rhythm and no murmurs and no lower extremity edema ABDOMEN:abdomen soft, non-tender and normal bowel sounds Musculoskeletal:no cyanosis of digits and no clubbing  NEURO: alert & oriented x 3 with fluent speech, no focal motor/sensory deficits  LABORATORY DATA:  I have reviewed the data as listed    Component Value Date/Time   NA 140 09/20/2021 0000   K 3.6 09/20/2021 0000   CL 100 09/20/2021 0000   CO2 31 (A) 09/20/2021 0000   GLUCOSE 199 (H) 04/25/2021 1035   BUN 16 09/20/2021 0000   CREATININE 0.7 09/20/2021 0000   CREATININE 0.69 04/25/2021 1035   CALCIUM 9.9 09/20/2021 0000   ALBUMIN 4.7 09/20/2021 0000   AST 27 09/20/2021 0000   ALT 34 09/20/2021 0000   ALKPHOS 79 09/20/2021 0000   GFRNONAA >60 04/25/2021 1035    No results found for: SPEP, UPEP  Lab Results  Component Value Date   WBC 6.0 09/20/2021   NEUTROABS 4.50 09/20/2021   HGB 11.7 (A) 09/20/2021   HCT 36 09/20/2021   MCV 101 (A) 06/07/2021   PLT 279 09/20/2021      Chemistry      Component Value Date/Time   NA 140 09/20/2021 0000   K 3.6 09/20/2021 0000   CL 100 09/20/2021 0000   CO2 31 (A) 09/20/2021 0000   BUN 16 09/20/2021 0000   CREATININE 0.7 09/20/2021 0000   CREATININE 0.69 04/25/2021 1035   GLU 156 09/20/2021 0000      Component Value Date/Time   CALCIUM 9.9 09/20/2021 0000   ALKPHOS 79 09/20/2021 0000   AST 27 09/20/2021 0000   ALT 34 09/20/2021 0000       RADIOGRAPHIC STUDIES: No results found.

## 2021-10-11 ENCOUNTER — Inpatient Hospital Stay: Payer: Medicare PPO

## 2021-10-11 ENCOUNTER — Telehealth: Payer: Self-pay | Admitting: Oncology

## 2021-10-11 ENCOUNTER — Inpatient Hospital Stay (HOSPITAL_BASED_OUTPATIENT_CLINIC_OR_DEPARTMENT_OTHER): Payer: Medicare PPO | Admitting: Oncology

## 2021-10-11 ENCOUNTER — Encounter: Payer: Self-pay | Admitting: Oncology

## 2021-10-11 ENCOUNTER — Other Ambulatory Visit: Payer: Self-pay | Admitting: Oncology

## 2021-10-11 VITALS — BP 141/68 | HR 98 | Temp 98.3°F | Resp 16 | Ht 61.0 in | Wt 143.2 lb

## 2021-10-11 DIAGNOSIS — M1990 Unspecified osteoarthritis, unspecified site: Secondary | ICD-10-CM | POA: Diagnosis not present

## 2021-10-11 DIAGNOSIS — Z1501 Genetic susceptibility to malignant neoplasm of breast: Secondary | ICD-10-CM

## 2021-10-11 DIAGNOSIS — C50912 Malignant neoplasm of unspecified site of left female breast: Secondary | ICD-10-CM

## 2021-10-11 DIAGNOSIS — C50919 Malignant neoplasm of unspecified site of unspecified female breast: Secondary | ICD-10-CM | POA: Diagnosis not present

## 2021-10-11 DIAGNOSIS — C50011 Malignant neoplasm of nipple and areola, right female breast: Secondary | ICD-10-CM

## 2021-10-11 LAB — CBC AND DIFFERENTIAL
HCT: 35 — AB (ref 36–46)
Hemoglobin: 11.2 — AB (ref 12.0–16.0)
Neutrophils Absolute: 5.21
Platelets: 257 10*3/uL (ref 150–400)
WBC: 6.6

## 2021-10-11 LAB — BASIC METABOLIC PANEL
BUN: 33 — AB (ref 4–21)
CO2: 23 — AB (ref 13–22)
Chloride: 108 (ref 99–108)
Creatinine: 1.7 — AB (ref 0.5–1.1)
Glucose: 153
Potassium: 4.8 mEq/L (ref 3.5–5.1)
Sodium: 141 (ref 137–147)

## 2021-10-11 LAB — HEPATIC FUNCTION PANEL
ALT: 27 U/L (ref 7–35)
AST: 42 — AB (ref 13–35)
Alkaline Phosphatase: 91 (ref 25–125)
Bilirubin, Total: 0.5

## 2021-10-11 LAB — COMPREHENSIVE METABOLIC PANEL
Albumin: 4.4 (ref 3.5–5.0)
Calcium: 10.4 (ref 8.7–10.7)

## 2021-10-11 LAB — CBC: RBC: 3.47 — AB (ref 3.87–5.11)

## 2021-10-11 MED FILL — Pertuzumab Soln for IV Infusion 420 MG/14ML (30 MG/ML): INTRAVENOUS | Qty: 14 | Status: AC

## 2021-10-11 MED FILL — Trastuzumab-anns For IV Soln 150 MG: INTRAVENOUS | Qty: 20 | Status: AC

## 2021-10-11 NOTE — Telephone Encounter (Signed)
Per 10/11/21 los next appt scheduled and confirmed with patient 

## 2021-10-15 ENCOUNTER — Inpatient Hospital Stay: Payer: Medicare PPO

## 2021-10-15 VITALS — BP 120/68 | HR 90 | Temp 98.0°F | Resp 18 | Ht 61.0 in | Wt 143.0 lb

## 2021-10-15 DIAGNOSIS — Z5112 Encounter for antineoplastic immunotherapy: Secondary | ICD-10-CM | POA: Diagnosis not present

## 2021-10-15 DIAGNOSIS — C50912 Malignant neoplasm of unspecified site of left female breast: Secondary | ICD-10-CM

## 2021-10-15 MED ORDER — TRASTUZUMAB-ANNS CHEMO 150 MG IV SOLR
6.0000 mg/kg | Freq: Once | INTRAVENOUS | Status: AC
  Administered 2021-10-15: 420 mg via INTRAVENOUS
  Filled 2021-10-15: qty 20

## 2021-10-15 MED ORDER — ACETAMINOPHEN 325 MG PO TABS
650.0000 mg | ORAL_TABLET | Freq: Once | ORAL | Status: AC
  Administered 2021-10-15: 650 mg via ORAL
  Filled 2021-10-15: qty 2

## 2021-10-15 MED ORDER — DIPHENHYDRAMINE HCL 25 MG PO CAPS
25.0000 mg | ORAL_CAPSULE | Freq: Once | ORAL | Status: AC
  Administered 2021-10-15: 25 mg via ORAL
  Filled 2021-10-15: qty 1

## 2021-10-15 MED ORDER — SODIUM CHLORIDE 0.9 % IV SOLN
420.0000 mg | Freq: Once | INTRAVENOUS | Status: AC
  Administered 2021-10-15: 420 mg via INTRAVENOUS
  Filled 2021-10-15: qty 14

## 2021-10-15 MED ORDER — SODIUM CHLORIDE 0.9 % IV SOLN: Freq: Once | INTRAVENOUS | Status: AC

## 2021-10-15 MED ORDER — SODIUM CHLORIDE 0.9% FLUSH
10.0000 mL | INTRAVENOUS | Status: DC | PRN
  Administered 2021-10-15: 10 mL

## 2021-10-15 MED ORDER — HEPARIN SOD (PORK) LOCK FLUSH 100 UNIT/ML IV SOLN
500.0000 [IU] | Freq: Once | INTRAVENOUS | Status: AC | PRN
  Administered 2021-10-15: 500 [IU]

## 2021-10-15 NOTE — Patient Instructions (Signed)
[facility]  Discharge Instructions: Thank you for choosing [facility] to provide your oncology and hematology care.  If you have a lab appointment with the [facility], please go directly to the [facility] and check in at the registration area.   Wear comfortable clothing and clothing appropriate for easy access to any Portacath or PICC line.   We strive to give you quality time with your provider. You may need to reschedule your appointment if you arrive late (15 or more minutes).  Arriving late affects you and other patients whose appointments are after yours.  Also, if you miss three or more appointments without notifying the office, you may be dismissed from the clinic at the provider's discretion.      For prescription refill requests, have your pharmacy contact our office and allow 72 hours for refills to be completed.    Today you received the following chemotherapy and/or immunotherapy agents trastuzumab/perjeta   To help prevent nausea and vomiting after your treatment, we encourage you to take your nausea medication as directed.  BELOW ARE SYMPTOMS THAT SHOULD BE REPORTED IMMEDIATELY: *FEVER GREATER THAN 100.4 F (38 C) OR HIGHER *CHILLS OR SWEATING *NAUSEA AND VOMITING THAT IS NOT CONTROLLED WITH YOUR NAUSEA MEDICATION *UNUSUAL SHORTNESS OF BREATH *UNUSUAL BRUISING OR BLEEDING *URINARY PROBLEMS (pain or burning when urinating, or frequent urination) *BOWEL PROBLEMS (unusual diarrhea, constipation, pain near the anus) TENDERNESS IN MOUTH AND THROAT WITH OR WITHOUT PRESENCE OF ULCERS (sore throat, sores in mouth, or a toothache) UNUSUAL RASH, SWELLING OR PAIN  UNUSUAL VAGINAL DISCHARGE OR ITCHING   Items with * indicate a potential emergency and should be followed up as soon as possible or go to the Emergency Department if any problems should occur.  Please show the CHEMOTHERAPY ALERT CARD or IMMUNOTHERAPY ALERT CARD at check-in to  the Emergency Department and triage nurse.  Should you have questions after your visit or need to cancel or reschedule your appointment, please contact [facility]  Dept: [phone]  and follow the prompts.  Office hours are 8:00 a.m. to 4:30 p.m. Monday - Friday. Please note that voicemails left after 4:00 p.m. may not be returned until the following business day.  We are closed weekends and major holidays. You have access to a nurse at all times for urgent questions. Please call the main number to the clinic Dept: [phone] and follow the prompts.  For any non-urgent questions, you may also contact your provider using MyChart. We now offer e-Visits for anyone 15 and older to request care online for non-urgent symptoms. For details visit [email or web address].   Also download the MyChart app! Go to the app store, search "MyChart", open the app, select [facility], and log in with your MyChart username and password.  Due to Covid, a mask is required upon entering the hospital/clinic. If you do not have a mask, one will be given to you upon arrival. For doctor visits, patients may have 1 support person aged 70 or older with them. For treatment visits, patients cannot have anyone with them due to current Covid guidelines and our immunocompromised population.   Pertuzumab injection What is this medication? PERTUZUMAB (per TOOZ ue mab) is a monoclonal antibody. It is used to treat breast cancer. This medicine may be used for other purposes; ask your health care provider or pharmacist if you have questions. COMMON BRAND NAME(S): PERJETA What should I tell my care team before I  take this medication? They need to know if you have any of these conditions: heart disease heart failure high blood pressure history of irregular heart beat recent or ongoing radiation therapy an unusual or allergic reaction to pertuzumab, other medicines, foods, dyes, or preservatives pregnant  or trying to get pregnant breast-feeding How should I use this medication? This medicine is for infusion into a vein. It is given by a health care professional in a hospital or clinic setting. Talk to your pediatrician regarding the use of this medicine in children. Special care may be needed. Overdosage: If you think you have taken too much of this medicine contact a poison control center or emergency room at once. NOTE: This medicine is only for you. Do not share this medicine with others. What if I miss a dose? It is important not to miss your dose. Call your doctor or health care professional if you are unable to keep an appointment. What may interact with this medication? Interactions are not expected. Give your health care provider a list of all the medicines, herbs, non-prescription drugs, or dietary supplements you use. Also tell them if you smoke, drink alcohol, or use illegal drugs. Some items may interact with your medicine. This list may not describe all possible interactions. Give your health care provider a list of all the medicines, herbs, non-prescription drugs, or dietary supplements you use. Also tell them if you smoke, drink alcohol, or use illegal drugs. Some items may interact with your medicine. What should I watch for while using this medication? Your condition will be monitored carefully while you are receiving this medicine. Report any side effects. Continue your course of treatment even though you feel ill unless your doctor tells you to stop. Do not become pregnant while taking this medicine or for 7 months after stopping it. Women should inform their doctor if they wish to become pregnant or think they might be pregnant. Women of child-bearing potential will need to have a negative pregnancy test before starting this medicine. There is a potential for serious side effects to an unborn child. Talk to your health care professional or pharmacist for more information. Do not  breast-feed an infant while taking this medicine or for 7 months after stopping it. Women must use effective birth control with this medicine. Call your doctor or health care professional for advice if you get a fever, chills or sore throat, or other symptoms of a cold or flu. Do not treat yourself. Try to avoid being around people who are sick. You may experience fever, chills, and headache during the infusion. Report any side effects during the infusion to your health care professional. What side effects may I notice from receiving this medication? Side effects that you should report to your doctor or health care professional as soon as possible: breathing problems chest pain or palpitations dizziness feeling faint or lightheaded fever or chills skin rash, itching or hives sore throat swelling of the face, lips, or tongue swelling of the legs or ankles unusually weak or tired Side effects that usually do not require medical attention (report to your doctor or health care professional if they continue or are bothersome): diarrhea hair loss nausea, vomiting tiredness This list may not describe all possible side effects. Call your doctor for medical advice about side effects. You may report side effects to FDA at 1-800-FDA-1088. Where should I keep my medication? This drug is given in a hospital or clinic and will not be stored at home. NOTE:  This sheet is a summary. It may not cover all possible information. If you have questions about this medicine, talk to your doctor, pharmacist, or health care provider.  2023 Elsevier/Gold Standard (2015-06-07 00:00:00) Trastuzumab injection for infusion What is this medication? TRASTUZUMAB (tras TOO zoo mab) is a monoclonal antibody. It is used to treat breast cancer and stomach cancer. This medicine may be used for other purposes; ask your health care provider or pharmacist if you have questions. COMMON BRAND NAME(S): Herceptin, Janae Bridgeman, Ontruzant, Trazimera What should I tell my care team before I take this medication? They need to know if you have any of these conditions: heart disease heart failure lung or breathing disease, like asthma an unusual or allergic reaction to trastuzumab, benzyl alcohol, or other medications, foods, dyes, or preservatives pregnant or trying to get pregnant breast-feeding How should I use this medication? This drug is given as an infusion into a vein. It is administered in a hospital or clinic by a specially trained health care professional. Talk to your pediatrician regarding the use of this medicine in children. This medicine is not approved for use in children. Overdosage: If you think you have taken too much of this medicine contact a poison control center or emergency room at once. NOTE: This medicine is only for you. Do not share this medicine with others. What if I miss a dose? It is important not to miss a dose. Call your doctor or health care professional if you are unable to keep an appointment. What may interact with this medication? This medicine may interact with the following medications: certain types of chemotherapy, such as daunorubicin, doxorubicin, epirubicin, and idarubicin This list may not describe all possible interactions. Give your health care provider a list of all the medicines, herbs, non-prescription drugs, or dietary supplements you use. Also tell them if you smoke, drink alcohol, or use illegal drugs. Some items may interact with your medicine. What should I watch for while using this medication? Visit your doctor for checks on your progress. Report any side effects. Continue your course of treatment even though you feel ill unless your doctor tells you to stop. Call your doctor or health care professional for advice if you get a fever, chills or sore throat, or other symptoms of a cold or flu. Do not treat yourself. Try to avoid being around people  who are sick. You may experience fever, chills and shaking during your first infusion. These effects are usually mild and can be treated with other medicines. Report any side effects during the infusion to your health care professional. Fever and chills usually do not happen with later infusions. Do not become pregnant while taking this medicine or for 7 months after stopping it. Women should inform their doctor if they wish to become pregnant or think they might be pregnant. Women of child-bearing potential will need to have a negative pregnancy test before starting this medicine. There is a potential for serious side effects to an unborn child. Talk to your health care professional or pharmacist for more information. Do not breast-feed an infant while taking this medicine or for 7 months after stopping it. Women must use effective birth control with this medicine. What side effects may I notice from receiving this medication? Side effects that you should report to your doctor or health care professional as soon as possible: allergic reactions like skin rash, itching or hives, swelling of the face, lips, or tongue chest pain or palpitations cough  dizziness feeling faint or lightheaded, falls fever general ill feeling or flu-like symptoms signs of worsening heart failure like breathing problems; swelling in your legs and feet unusually weak or tired Side effects that usually do not require medical attention (report to your doctor or health care professional if they continue or are bothersome): bone pain changes in taste diarrhea joint pain nausea/vomiting weight loss This list may not describe all possible side effects. Call your doctor for medical advice about side effects. You may report side effects to FDA at 1-800-FDA-1088. Where should I keep my medication? This drug is given in a hospital or clinic and will not be stored at home. NOTE: This sheet is a summary. It may not cover all  possible information. If you have questions about this medicine, talk to your doctor, pharmacist, or health care provider.  2023 Elsevier/Gold Standard (2016-05-20 00:00:00)

## 2021-10-21 ENCOUNTER — Encounter: Payer: Self-pay | Admitting: Oncology

## 2021-10-28 ENCOUNTER — Telehealth: Payer: Self-pay

## 2021-10-28 NOTE — Telephone Encounter (Signed)
-----   Message from [name], MD sent at 10/28/2021  9:16 AM EDT ----- Regarding: RE: admitted Called husband to get as much info as poss.  She has been accepted for tx to [facility] but waiting on bed.  Doesn't know name of docs here or there, has been someone different each day.  I explained that she is on monoclonal ab's, off chemo for months.  Last creat 1.7 two weeks ago, now 7.97, but also on metformin, gabapentin, duloxetine, meloxicam, etc. They told him it is all from "chemo".  Manuela Schwartz, any thoughts?  I will try to catch up with doc at [facility] once she is transferred but most important is that they know she has no evidence of cancer at this time and would be aggressive with her Rx.  I wish our answering service would have let me know, I would have called over weekend. ----- Message ----- From: Georgette Shell, RN Sent: 10/28/2021   8:56 AM EDT To: [name], MD Subject: admitted                                       Chiquetta Langner [phone] called to let us know that Maeley was admitted to [facility] on Friday.  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  They are transferring her to [facility] today.  Mr. Terrance tried to contact you over the weekend and was told per him that they couldn't transfer call to you, that it would have to be physician to physician since she was admitted and under their care.  Hospital told Mr. 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  He really wants to talk with you.

## 2021-11-01 ENCOUNTER — Ambulatory Visit: Payer: Medicare PPO | Admitting: Hematology and Oncology

## 2021-11-01 ENCOUNTER — Other Ambulatory Visit: Payer: Medicare PPO

## 2021-11-04 ENCOUNTER — Ambulatory Visit: Payer: Medicare PPO

## 2021-11-08 ENCOUNTER — Other Ambulatory Visit: Payer: Self-pay | Admitting: Oncology

## 2021-11-28 ENCOUNTER — Ambulatory Visit: Payer: Medicare PPO | Admitting: Plastic Surgery

## 2022-01-16 ENCOUNTER — Ambulatory Visit: Payer: Medicare PPO | Admitting: Hematology and Oncology

## 2022-01-16 ENCOUNTER — Other Ambulatory Visit: Payer: Medicare PPO

## 2022-01-17 ENCOUNTER — Other Ambulatory Visit: Payer: Self-pay

## 2022-01-22 NOTE — Progress Notes (Signed)
[name]  [address] [place],    [phone]  Clinic Day:  01/23/2022  Referring physician: [name], FNP  ASSESSMENT & PLAN:   Assessment & Plan: Local recurrence of cancer of left breast [facility]) In December 2021, she presented with a palpable nodule in the upper inner quadrant of the left breast.  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  Estrogen receptors positive, progesterone receptor negative and HER2 positive. This was felt to most likely represent a recurrence rather than a 2nd breast primary.  She was treated with 6 cycles of neoadjuvant TCHP in July.  After 4 cycles, the doses were reduced by 25%, but she still was experiencing adverse effects. She underwent bilateral mastectomy with immediate reconstruction in August 2022.  She received three cycles of HER2 targeted therapy prior to surgery. She received 10 of 11 planned doses of trastuzumab/pertuzumab and then was admitted with acute kidney injury felt to be due to the HER2 targeted therapy. She also had significant elevation of the transaminases.  She was found to have rhabdomyolysis.  She required temporary dialysis.  Multiple medications were discontinued including non-steroidal antiinflammatory agents, her diabetes medication and statin.  She has had persistent anemia, but otherwise recovered well.  Due to estrogen receptor positive disease, we recommend placing her on an aromatase inhibitor.  Bond density scan in January 2019 was normal.  Her 1st cancer was treated with anastrozole, so we will start her on letrozole 2.5 mg daily.  We discussed the common side effects of hot flashes, arthralgias, headache, fatigue and swelling of hands or feet.  There can also be worsening bone density, elevated transaminases and elevated cholesterol.  Her primary care provider recently left her practice.   The patient states she will establish with a new PCP as soon as possible.  She is overdue for bone density scan, so will get that scheduled.  We will plan to see her back in 1 month for repeat clinical assessment.  Malignant neoplasm of lower-outer quadrant of left breast of female, estrogen receptor positive ([place]) History of stage IIIA (T2 N2 M0) diagnosed in June 2004,  She was treated with lumpectomy and axillary disection.  Pathology revealed a 2 cm, grade 2, infiltrating ductal carcinoma.  4 of 16 nodes were positive.  Estrogen receptors were positive at 40%, progesterone receptor 15%, HER 2 negative.  S-phase was 9.1%, which is considered unfavorable. She received adjuvant chemotherapy with dose-dense Adriamycin and Cytoxan for 4 cycles, followed by dose denseTaxol for 4 cycles.  She received adjuvant radiation to the left breast.  She was placed on Arimidex in April 2005.  We recommended 10 years of adjuvant endocrine therapy, which she completed in April 2015.  Hypomagnesemia She remains on magnesium sulfate 400 mg daily. Magnesium is normal today.  Pulmonary nodules 2 small pulmonary nodules on CT chest in June. We will plan f/u in 3 to 9 months.  Anemia Persistent mild anemia, which may be due to the recent prolonged illness.  Recent evaluation did not reveal any nutritional deficiency contributing to her anemia.  We will continue to monitor this.   The patient understands the plans discussed today and is in agreement with them.  She knows to contact our office if she develops concerns prior to her next appointment.   I provided 35 minutes of face-to-face time during this encounter and > 50% was spent counseling as  documented under my assessment and plan.    Marvia Pickles, PA-C  [facility] AT [facility] [address] [place] [address] Dept: [phone] Dept Fax: [phone]   Orders Placed This Encounter  Procedures    DG Bone Density    Standing Status:   Future    Standing Expiration Date:   01/24/2023    Scheduling Instructions:     RH    Order Specific Question:   Reason for Exam (SYMPTOM  OR DIAGNOSIS REQUIRED)    Answer:   screening for osteoporosis    Order Specific Question:   Preferred imaging location?    Answer:   External   CBC and differential    This external order was created through the Results Console.   CBC    This external order was created through the Results Console.   Basic metabolic panel    This external order was created through the Results Console.   Comprehensive metabolic panel    This external order was created through the Results Console.   Hepatic function panel    This external order was created through the Results Console.   CBC    This order was created through External Result Entry   Vitamin B12    Standing Status:   Future    Number of Occurrences:   1    Standing Expiration Date:   01/24/2023   Magnesium    Standing Status:   Standing    Number of Occurrences:   33    Standing Expiration Date:   01/24/2023      CHIEF COMPLAINT:  CC: Recurrent hormone and HER2 receptor positive breast cancer  Current Treatment:  Observation  HISTORY OF PRESENT ILLNESS:   Oncology History  Malignant neoplasm of lower-outer quadrant of left breast of female, estrogen receptor positive ([place])  11/03/2002 Initial Diagnosis   Breast cancer in female [facility])   11/03/2002 Cancer Staging   Staging form: Breast, AJCC 6th Edition - Clinical stage from 11/03/2002: Stage IIIA (T2, N2, M0) - Signed by [name], MD on 06/04/2020 Staging comments: Treated with AC x4, Taxol x4, anastrazole x 10 years, completed April 2015 Prognostic indicators: ER/PR pos, HER2 neg, age 71 at dx, had re-excision for pos margin   Local recurrence of cancer of left breast ([place])  06/08/2020 Initial Diagnosis   Local recurrence of cancer of right breast ([place])   06/20/2020 Cancer Staging   Staging  form: Breast, AJCC 8th Edition - Clinical stage from 06/20/2020: Stage IIA (rcT2, cN0, cM0, G3, ER+, PR-, HER2+) - Signed by [name], MD on 06/27/2020 Histopathologic type: Carcinoma, NOS Stage prefix: Recurrence Method of lymph node assessment: Clinical Nuclear grade: G3 Multigene prognostic tests performed: None Menopausal status: Postmenopausal Ki-67 (%): 25 Stage used in treatment planning: Yes National guidelines used in treatment planning: Yes Type of national guideline used in treatment planning: NCCN Staging comments: 3.2 total size, incl satellite lesion.  Arising in adipose tissue so susp for recurrence but now HER @ positive   07/03/2020 - 10/22/2020 Chemotherapy    Patient is on Treatment Plan: BREAST TRASTUZUMAB + PERTUZUMAB Q21D X 11 CYCLES      11/08/2020 - 10/15/2021 Chemotherapy   Patient is on Treatment Plan : BREAST Trastuzumab + Pertuzumab q21d x 11 cycles     12/13/2020 Cancer Staging   Staging form: Breast, AJCC 8th Edition - Pathologic stage from 12/13/2020: No Stage Recommended (ypT1c, pN0, cM0,  G2, ER+, PR-, HER2+) - Signed by [name], MD on 01/10/2021 Histopathologic type: Carcinoma, NOS Stage prefix: Post-therapy Response to neoadjuvant therapy: Partial response Method of lymph node assessment: Clinical Nuclear grade: G2 Multigene prognostic tests performed: None Histologic grading system: 3 grade system Residual tumor (R): R0 - None Laterality: Left Tumor size (mm): 15 Lymph-vascular invasion (LVI): LVI not present (absent)/not identified Diagnostic confirmation: Positive histology Specimen type: Excision Staged by: Managing physician Menopausal status: Postmenopausal Ki-67 (%): 25 Stage used in treatment planning: Yes National guidelines used in treatment planning: Yes Type of national guideline used in treatment planning: NCCN   Hypomagnesemia      INTERVAL HISTORY:  Tammy Fowler is here today for repeat clinical assessment after  being admitted in June through July with acute kidney injury requiring dialysis.  This was felt to be due to immunotherapy, however, she was on multiple medications that could have contributed.  She also had elevation of the transaminases.  She was found to have rhabdomyolysis.  While she was hospitalized, CT chest, abdomen and pelvis revealed parenchymal density in left lung apex is likely scarring. There was 4 mm right lower lobe pulmonary nodule and 3 mm left lower lobe pulmonary nodule. Follow-up per Fleischner guidelines. No acute abnormality seen in the abdomen or pelvis.  She has recovered well but has had persistent anemia.  She denies fevers or chills. She denies pain. Her appetite is good. Her weight has decreased 10 pounds over last 3 months .  REVIEW OF SYSTEMS:  Review of Systems  Constitutional:  Negative for appetite change, chills, fatigue, fever and unexpected weight change.  HENT:   Negative for lump/mass, mouth sores and sore throat.   Respiratory:  Negative for cough and shortness of breath.   Cardiovascular:  Negative for chest pain and leg swelling.  Gastrointestinal:  Negative for abdominal pain, constipation, diarrhea, nausea and vomiting.  Endocrine: Negative for hot flashes.  Genitourinary:  Negative for difficulty urinating, dysuria, frequency and hematuria.   Musculoskeletal:  Positive for arthralgias (knees). Negative for back pain and myalgias.  Skin:  Negative for rash.  Neurological:  Negative for dizziness and headaches.  Hematological:  Negative for adenopathy. Does not bruise/bleed easily.  Psychiatric/Behavioral:  Negative for depression and sleep disturbance. The patient is not nervous/anxious.      VITALS:  Blood pressure (!) 121/57, pulse 71, temperature 98.6 F (37 C), temperature source Oral, resp. rate 20, height 5' 1"  (1.549 m), weight 133 lb 4.8 oz (60.5 kg), SpO2 98 %.  Wt Readings from Last 3 Encounters:  01/23/22 133 lb 4.8 oz (60.5 kg)  10/15/21  143 lb (64.9 kg)  10/11/21 143 lb 3.2 oz (65 kg)    Body mass index is 25.19 kg/m.  Performance status (ECOG): 1 - Symptomatic but completely ambulatory  PHYSICAL EXAM:  Physical Exam Vitals and nursing note reviewed.  Constitutional:      General: She is not in acute distress.    Appearance: Normal appearance.  HENT:     Head: Normocephalic and atraumatic.     Mouth/Throat:     Mouth: Mucous membranes are moist.     Pharynx: Oropharynx is clear. No oropharyngeal exudate or posterior oropharyngeal erythema.  Eyes:     General: No scleral icterus.    Extraocular Movements: Extraocular movements intact.     Conjunctiva/sclera: Conjunctivae normal.     Pupils: Pupils are equal, round, and reactive to light.  Cardiovascular:     Rate and Rhythm: Normal rate  and regular rhythm.     Heart sounds: Normal heart sounds. No murmur heard.    No friction rub. No gallop.  Pulmonary:     Effort: Pulmonary effort is normal.     Breath sounds: Normal breath sounds. No wheezing, rhonchi or rales.  Abdominal:     General: There is no distension.     Palpations: Abdomen is soft. There is no hepatomegaly, splenomegaly or mass.     Tenderness: There is no abdominal tenderness.  Musculoskeletal:        General: Normal range of motion.     Cervical back: Normal range of motion and neck supple. No tenderness.     Right lower leg: No edema.     Left lower leg: No edema.  Lymphadenopathy:     Cervical: No cervical adenopathy.     Upper Body:     Right upper body: No supraclavicular or axillary adenopathy.     Left upper body: No supraclavicular or axillary adenopathy.     Lower Body: No right inguinal adenopathy. No left inguinal adenopathy.  Skin:    General: Skin is warm and dry.     Coloration: Skin is not jaundiced.     Findings: No rash.  Neurological:     Mental Status: She is alert and oriented to person, place, and time.     Cranial Nerves: No cranial nerve deficit.  Psychiatric:         Mood and Affect: Mood normal.        Behavior: Behavior normal.        Thought Content: Thought content normal.    LABS:      Latest Ref Rng & Units 01/23/2022   12:00 AM 10/11/2021   12:00 AM 09/20/2021   12:00 AM  CBC  WBC  5.1     6.6     6.0      Hemoglobin 12.0 - 16.0 10.8     11.2     11.7      Hematocrit 36 - 46 32     35     36      Platelets 150 - 400 K/uL 286     257     279         This result is from an external source.      Latest Ref Rng & Units 01/23/2022   12:00 AM 10/11/2021   12:00 AM 09/20/2021   12:00 AM  CMP  BUN 4 - 21 25     33     16      Creatinine 0.5 - 1.1 0.8     1.7     0.7      Sodium 137 - 147 137     141     140      Potassium 3.5 - 5.1 mEq/L 4.2     4.8     3.6      Chloride 99 - 108 105     108     100      CO2 13 - 22 27     23     31       Calcium 8.7 - 10.7 10.1     10.4     9.9      Alkaline Phos 25 - 125 63     91     79      AST 13 - 35 38     42  27      ALT 7 - 35 U/L 18     27     34         This result is from an external source.     No results found for: "CEA1", "CEA" / No results found for: "CEA1", "CEA" No results found for: "PSA1" No results found for: "TMH962" No results found for: "CAN125"  No results found for: "TOTALPROTELP", "ALBUMINELP", "A1GS", "A2GS", "BETS", "BETA2SER", "GAMS", "MSPIKE", "SPEI" No results found for: "TIBC", "FERRITIN", "IRONPCTSAT" No results found for: "LDH"  STUDIES:  No results found.    HISTORY:   Past Medical History:  Diagnosis Date   Cancer ([place])    Depression    Diabetes mellitus without complication ([employer])    Hyperlipidemia    Hypomagnesemia 08/10/2020   Pulmonary nodules 01/23/2022   Sleep apnea     Past Surgical History:  Procedure Laterality Date   ABDOMINAL HYSTERECTOMY     BREAST RECONSTRUCTION WITH PLACEMENT OF TISSUE EXPANDER AND FLEX HD (ACELLULAR HYDRATED DERMIS) Bilateral 12/11/2020   Procedure: BILATERAL BREAST RECONSTRUCTION WITH PLACEMENT OF TISSUE EXPANDER  AND FLEX HD (ACELLULAR HYDRATED DERMIS);  Surgeon: [name], MD;  Location: [facility];  Service: Plastics;  Laterality: Bilateral;   BREAST SURGERY Left    HYSTERECTOMY ABDOMINAL WITH SALPINGO-OOPHORECTOMY  07/2003   due to fibroids   NIPPLE SPARING MASTECTOMY Bilateral 12/11/2020   Procedure: BILATERAL NIPPLE SPARING MASTECTOMY;  Surgeon: [name], MD;  Location: [place];  Service: General;  Laterality: Bilateral;   PORTACATH PLACEMENT Right 06/26/2020   Procedure: INSERTION PORT-A-CATH;  Surgeon: [name], MD;  Location: [facility];  Service: General;  Laterality: Right;   REMOVAL OF BILATERAL TISSUE EXPANDERS WITH PLACEMENT OF BILATERAL BREAST IMPLANTS Bilateral 04/30/2021   Procedure: REMOVAL OF BILATERAL TISSUE EXPANDERS WITH PLACEMENT OF BILATERAL BREAST IMPLANTS;  Surgeon: [name], MD;  Location: [place];  Service: Plastics;  Laterality: Bilateral;   TRIGGER FINGER RELEASE      Family History  Problem Relation Age of Onset   Breast cancer Mother 22   Breast cancer Maternal Aunt 25   Ovarian cancer Maternal Grandmother 76   Bone cancer Maternal Grandfather        28s   Cancer Maternal Aunt 55    Social History:  reports that she has never smoked. She has never used smokeless tobacco. She reports current alcohol use. She reports that she does not use drugs.The patient is alone today.  Allergies:  Allergies  Allergen Reactions   Oxycodone Itching    Other reaction(s): Itching   Codeine Other (See Comments)    Other reaction(s): Other (See Comments) Unknown Unknown  Other reaction(s): Other (See Comments), Other (See Comments), Other (See Comments) Unknown Unknown Other reaction(s): Other (See Comments) Unknown Unknown Other reaction(s): Other (See Comments), Other (See Comments), Other (See Comments) Unknown Unknown Other reaction(s): Other (See  Comments) Unknown Unknown  Other reaction(s): Other (See Comments) Unknown Unknown  Other reaction(s): Other (See Comments), Other (See Comments), Other (See Comments) Unknown Unknown Other reaction(s): Other (See Comments) Unknown Unknown Other reaction(s): Other (See Comments), Other (See Comments), Other (See Comments) Unknown Unknown Other reaction(s): Other (See Comments) Unknown Unknown  Other reaction(s): Other (See Comments) Unknown Unknown  Other reaction(s): Other (See Comments), Other (See Comments), Other (See Comments) Unknown Unknown Other reaction(s): Other (See Comments) Unknown Unknown Other reaction(s): Other (See Comments) Unknown Unknown  Other reaction(s): Other (See  Comments), Other (See Comments), Other (See Comments) Unknown Unknown Other reaction(s): Other (See Comments) Unknown Unknown Other reaction(s): Other (See Comments), Other (See Comments), Other (See Comments) Unknown Unknown Other reaction(s): Other (See Comments) Unknown Unknown  Other reaction(s): Other (See Comments) Unknown Unknown  Other reaction(s): Other (See Comments), Other (See Comments), Other (See Comments) Unknown Unknown Other reaction(s): Other (See Comments) Unknown Unknown   Lisinopril Cough    Other reaction(s): Cough (ALLERGY/intolerance) Other reaction(s): Cough Other reaction(s): Cough (ALLERGY/intolerance) Other reaction(s): Cough (ALLERGY/intolerance)  Other reaction(s): Cough (ALLERGY/intolerance) Other reaction(s): Cough Other reaction(s): Cough (ALLERGY/intolerance)    Current Medications: Current Outpatient Medications  Medication Sig Dispense Refill   letrozole (FEMARA) 2.5 MG tablet Take 1 tablet (2.5 mg total) by mouth daily. 30 tablet 5   ACCU-CHEK GUIDE test strip daily. for testing as directed     aspirin 81 MG chewable tablet Chew by mouth.     calcium-vitamin D 250-100 MG-UNIT tablet Take by mouth.     diclofenac Sodium  (VOLTAREN) 1 % GEL Apply 2 g topically 2 (two) times daily as needed.     diphenoxylate-atropine (LOMOTIL) 2.5-0.025 MG tablet Take 2 tablets by mouth 4 (four) times daily as needed for diarrhea or loose stools. (Patient not taking: Reported on 08/30/2021) 60 tablet 3   Docusate Sodium (DSS) 100 MG CAPS Take by mouth.     DULoxetine (CYMBALTA) 60 MG capsule Take 1 capsule by mouth daily.     gabapentin (NEURONTIN) 300 MG capsule Take by mouth at bedtime.     HYDROcodone-acetaminophen (NORCO) 10-325 MG tablet Take 1 tablet by mouth 2 (two) times daily as needed.     loperamide (IMODIUM A-D) 2 MG tablet Take 2 mg by mouth 4 (four) times daily as needed for diarrhea or loose stools.     LORazepam (ATIVAN) 0.5 MG tablet Take 1 tablet (0.5 mg total) by mouth every 6 (six) hours as needed for anxiety. 90 tablet 0   losartan (COZAAR) 50 MG tablet daily.     magnesium oxide (MAG-OX) 400 (241.3 Mg) MG tablet Take 1 tablet (400 mg total) by mouth 2 (two) times daily. (Patient not taking: Reported on 08/07/2021) 60 tablet 1   meclizine (ANTIVERT) 25 MG tablet Take 1 tablet (25 mg total) by mouth 3 (three) times daily as needed for dizziness. (Patient not taking: Reported on 08/30/2021) 30 tablet 0   Multiple Vitamins-Minerals (THERA-M) TABS Take by mouth.     naloxone (NARCAN) nasal spray 4 mg/0.1 mL SMARTSIG:Both Nares     ondansetron (ZOFRAN) 4 MG tablet Take 1 tablet (4 mg total) by mouth every 8 (eight) hours as needed for nausea or vomiting. 20 tablet 0   prochlorperazine (COMPAZINE) 10 MG tablet Take 1 tablet (10 mg total) by mouth every 6 (six) hours as needed (Nausea or vomiting). 30 tablet 1   rOPINIRole (REQUIP) 0.5 MG tablet Take by mouth.     traMADol (ULTRAM) 50 MG tablet Take 1-2 tablets (50-100 mg total) by mouth every 6 (six) hours as needed. 60 tablet 0   No current facility-administered medications for this visit.

## 2022-01-22 NOTE — Assessment & Plan Note (Addendum)
In December 2021, she presented with a palpable nodule in the upper inner quadrant of the left breast.  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  Estrogen receptors positive, progesterone receptor negative and HER2 positive. This was felt to most likely represent a recurrence rather than a 2nd breast primary.She was treated with 6 cycles of neoadjuvant [facility] July. After 4 cycles, the doses were reduced by 25%, but she still was experiencing adverse effects. She underwent bilateral mastectomy with immediate reconstruction in August 2022.  She received threecyclesof HER2 targeted therapy prior to surgery. She received 10 of 11 planned doses of trastuzumab/pertuzumab and then was admitted with acute kidney injury felt to be due to the HER2 targeted therapy. She also had significant elevation of the transaminases.  She was found to have rhabdomyolysis.  She required temporary dialysis.  Multiple medications were discontinued including non-steroidal antiinflammatory agents, her diabetes medication and statin.  She has had persistent anemia, but otherwise recovered well.  Due to estrogen receptor positive disease, we recommend placing her on an aromatase inhibitor.  Bond density scan in January 2019 was normal.  Her 1st cancer was treated with anastrozole, so we will start her on letrozole 2.5 mg daily.  We discussed the common side effects of hot flashes, arthralgias, headache, fatigue and swelling of hands or feet.  There can also be worsening bone density, elevated transaminases and elevated cholesterol.  Her primary care provider recently left her practice.  The patient states she will establish with a new PCP as soon as possible.  She is overdue for bone density scan, so will get that scheduled.  We will plan to see her back in 1 month for repeat clinical assessment.

## 2022-01-23 ENCOUNTER — Encounter: Payer: Self-pay | Admitting: Hematology and Oncology

## 2022-01-23 ENCOUNTER — Inpatient Hospital Stay (INDEPENDENT_AMBULATORY_CARE_PROVIDER_SITE_OTHER): Payer: Medicare PPO | Admitting: Hematology and Oncology

## 2022-01-23 ENCOUNTER — Telehealth: Payer: Self-pay

## 2022-01-23 ENCOUNTER — Inpatient Hospital Stay: Payer: Medicare PPO | Attending: Hematology and Oncology

## 2022-01-23 VITALS — BP 121/57 | HR 71 | Temp 98.6°F | Resp 20 | Ht 61.0 in | Wt 133.3 lb

## 2022-01-23 DIAGNOSIS — Z9221 Personal history of antineoplastic chemotherapy: Secondary | ICD-10-CM | POA: Diagnosis not present

## 2022-01-23 DIAGNOSIS — Z170 Estrogen receptor positive status [ER+]: Secondary | ICD-10-CM | POA: Insufficient documentation

## 2022-01-23 DIAGNOSIS — C50011 Malignant neoplasm of nipple and areola, right female breast: Secondary | ICD-10-CM

## 2022-01-23 DIAGNOSIS — C50512 Malignant neoplasm of lower-outer quadrant of left female breast: Secondary | ICD-10-CM | POA: Diagnosis present

## 2022-01-23 DIAGNOSIS — Z13820 Encounter for screening for osteoporosis: Secondary | ICD-10-CM | POA: Diagnosis not present

## 2022-01-23 DIAGNOSIS — C50912 Malignant neoplasm of unspecified site of left female breast: Secondary | ICD-10-CM

## 2022-01-23 DIAGNOSIS — D649 Anemia, unspecified: Secondary | ICD-10-CM | POA: Diagnosis not present

## 2022-01-23 DIAGNOSIS — R918 Other nonspecific abnormal finding of lung field: Secondary | ICD-10-CM

## 2022-01-23 HISTORY — DX: Other nonspecific abnormal finding of lung field: R91.8

## 2022-01-23 LAB — CBC AND DIFFERENTIAL
HCT: 32 — AB (ref 36–46)
Hemoglobin: 10.8 — AB (ref 12.0–16.0)
Neutrophils Absolute: 3.32
Platelets: 286 10*3/uL (ref 150–400)
WBC: 5.1

## 2022-01-23 LAB — BASIC METABOLIC PANEL
BUN: 25 — AB (ref 4–21)
CO2: 27 — AB (ref 13–22)
Chloride: 105 (ref 99–108)
Creatinine: 0.8 (ref 0.5–1.1)
Glucose: 162
Potassium: 4.2 mEq/L (ref 3.5–5.1)
Sodium: 137 (ref 137–147)

## 2022-01-23 LAB — HEPATIC FUNCTION PANEL
ALT: 18 U/L (ref 7–35)
AST: 38 — AB (ref 13–35)
Alkaline Phosphatase: 63 (ref 25–125)
Bilirubin, Total: 0.7

## 2022-01-23 LAB — MAGNESIUM: Magnesium: 1.8 mg/dL (ref 1.7–2.4)

## 2022-01-23 LAB — CBC
MCV: 94 (ref 81–99)
RBC: 3.43 — AB (ref 3.87–5.11)

## 2022-01-23 LAB — COMPREHENSIVE METABOLIC PANEL
Albumin: 4.3 (ref 3.5–5.0)
Calcium: 10.1 (ref 8.7–10.7)

## 2022-01-23 LAB — VITAMIN B12: Vitamin B-12: 309 pg/mL (ref 180–914)

## 2022-01-23 MED ORDER — LETROZOLE 2.5 MG PO TABS: 2.5000 mg | ORAL_TABLET | Freq: Every day | ORAL | 5 refills | Status: DC

## 2022-01-23 NOTE — Assessment & Plan Note (Signed)
Persistent mild anemia, which may be due to the recent prolonged illness.  Recent evaluation did not reveal any nutritional deficiency contributing to her anemia.  We will continue to monitor this.

## 2022-01-23 NOTE — Assessment & Plan Note (Signed)
2 small pulmonary nodules on CT chest in June. We will plan f/u in 3 to 9 months.

## 2022-01-23 NOTE — Assessment & Plan Note (Signed)
History of stage IIIA (T2 N2 M0) diagnosed in June 2004,  She was treated with lumpectomy and axillary disection.  Pathology revealed a 2 cm, grade 2, infiltrating ductal carcinoma.  4 of 16 nodes were positive.  Estrogen receptors were positive at 40%, progesterone receptor 15%, HER 2 negative.  S-phase was 9.1%, which is considered unfavorable. She received adjuvant chemotherapy with dose-dense Adriamycin and Cytoxan for 4 cycles, followed by dose denseTaxol for 4 cycles.  She received adjuvant radiation to the left breast.  She was placed on Arimidex in April 2005.  We recommended 10 years of adjuvant endocrine therapy, which she completed in April 2015.

## 2022-01-23 NOTE — Assessment & Plan Note (Signed)
She remains on magnesium sulfate 400 mg daily. Magnesium is normal today.

## 2022-01-23 NOTE — Telephone Encounter (Signed)
Patient informed that we will contact her regarding port removal details. Patient also informed of Chest/Abd/Pelvis scans while at [facility] and that [place] for rescans in approximately 6 months. Patient also informed that Jonathon Jordan will not impact planned knee surgery.

## 2022-01-28 ENCOUNTER — Telehealth: Payer: Self-pay

## 2022-01-28 ENCOUNTER — Other Ambulatory Visit: Payer: Self-pay

## 2022-01-28 NOTE — Telephone Encounter (Signed)
-----   Message from Maxwell Marion, RN sent at 01/23/2022  5:03 PM EDT ----- Regarding: FW: follow up questions Update to previous message-    Dr [name] put in Tammy Fowler's port- Tammy Fowler said IR here won't take them out if they did not put it in. Do we need to do a referral for this?   I let her know that her med would not interfere with surgery and that we would repeat scans in 6 months. Since the CT of Chest/Abd and Pelvis showed a small pulmonary nodule-   Thanks,  Tammy Fowler   ----- Message ----- From: Maxwell Marion, RN Sent: 01/23/2022   4:41 PM EDT To: [name], MD; Belva Chimes, LPN Subject: follow up questions                            Tammy Fowler called back after her appt. With some questions  1. She would like to have her port removed. How can we make this happen for her?  2. She is scheduled to have a knee surgery in Feb. Will the Letrizole interfere with this?   3. How can we be sure she is cancer free without a scan? She knows insurance will deny it but is there any way to determine this for sure  Thanks Tammy Fowler

## 2022-01-28 NOTE — Telephone Encounter (Signed)
Referral sent to Dr. Donne Hazel for port removal

## 2022-02-04 ENCOUNTER — Telehealth: Payer: Self-pay

## 2022-02-04 NOTE — Telephone Encounter (Signed)
Patient called and wants to know if we can prescribe Ambien to help her sleep.

## 2022-02-12 ENCOUNTER — Other Ambulatory Visit: Payer: Self-pay | Admitting: Hematology and Oncology

## 2022-02-12 MED ORDER — ZOLPIDEM TARTRATE 5 MG PO TABS: 5.0000 mg | ORAL_TABLET | Freq: Every evening | ORAL | 2 refills | Status: DC | PRN

## 2022-02-20 ENCOUNTER — Other Ambulatory Visit: Payer: Medicare PPO

## 2022-02-20 ENCOUNTER — Ambulatory Visit: Payer: Medicare PPO | Admitting: Oncology

## 2022-02-27 ENCOUNTER — Inpatient Hospital Stay: Payer: Medicare PPO | Attending: Hematology and Oncology

## 2022-02-27 ENCOUNTER — Inpatient Hospital Stay (HOSPITAL_BASED_OUTPATIENT_CLINIC_OR_DEPARTMENT_OTHER): Payer: Medicare PPO | Admitting: Oncology

## 2022-02-27 ENCOUNTER — Encounter: Payer: Self-pay | Admitting: Oncology

## 2022-02-27 ENCOUNTER — Other Ambulatory Visit: Payer: Self-pay | Admitting: Oncology

## 2022-02-27 VITALS — BP 123/58 | HR 91 | Temp 98.1°F | Resp 18 | Ht 61.0 in | Wt 137.0 lb

## 2022-02-27 DIAGNOSIS — C50919 Malignant neoplasm of unspecified site of unspecified female breast: Secondary | ICD-10-CM | POA: Diagnosis not present

## 2022-02-27 DIAGNOSIS — C50912 Malignant neoplasm of unspecified site of left female breast: Secondary | ICD-10-CM

## 2022-02-27 DIAGNOSIS — R918 Other nonspecific abnormal finding of lung field: Secondary | ICD-10-CM

## 2022-02-27 DIAGNOSIS — C50011 Malignant neoplasm of nipple and areola, right female breast: Secondary | ICD-10-CM

## 2022-02-27 LAB — BASIC METABOLIC PANEL
BUN: 22 — AB (ref 4–21)
CO2: 26 — AB (ref 13–22)
Chloride: 105 (ref 99–108)
Creatinine: 0.8 (ref 0.5–1.1)
Glucose: 149
Potassium: 3.9 mEq/L (ref 3.5–5.1)
Sodium: 138 (ref 137–147)

## 2022-02-27 LAB — COMPREHENSIVE METABOLIC PANEL
Albumin: 4.5 (ref 3.5–5.0)
Calcium: 10.3 (ref 8.7–10.7)

## 2022-02-27 LAB — HEPATIC FUNCTION PANEL
ALT: 20 U/L (ref 7–35)
AST: 24 (ref 13–35)
Alkaline Phosphatase: 79 (ref 25–125)
Bilirubin, Total: 0.5

## 2022-02-27 LAB — CBC AND DIFFERENTIAL
HCT: 38 (ref 36–46)
Hemoglobin: 12.9 (ref 12.0–16.0)
Neutrophils Absolute: 4.34
Platelets: 321 10*3/uL (ref 150–400)
WBC: 6.2

## 2022-02-27 LAB — CBC: RBC: 4.04 (ref 3.87–5.11)
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
## 2022-02-28 LAB — MAGNESIUM: Magnesium: 1.5

## 2022-03-04 ENCOUNTER — Telehealth: Payer: Self-pay | Admitting: Oncology

## 2022-03-04 NOTE — Telephone Encounter (Signed)
CT Chest W/O Contrast has been scheduled for 03/13/22 @ 1:30 pm ; Check in at 1 pm  Notified pt of date,time and instructions.

## 2022-03-17 ENCOUNTER — Encounter: Payer: Self-pay | Admitting: Oncology

## 2022-03-19 ENCOUNTER — Telehealth: Payer: Self-pay

## 2022-03-19 NOTE — Telephone Encounter (Signed)
Patient notified of scan results. Per Dr. Hinton Rao, no signs of cancer, small nodules are same as last scans-will watch for now. Also scar tissue seen from radiation.

## 2022-03-25 ENCOUNTER — Encounter: Payer: Self-pay | Admitting: Oncology

## 2022-03-25 ENCOUNTER — Encounter: Payer: Self-pay | Admitting: Hematology and Oncology

## 2022-03-31 ENCOUNTER — Encounter: Payer: Self-pay | Admitting: Hematology and Oncology

## 2022-04-02 ENCOUNTER — Telehealth: Payer: Self-pay

## 2022-04-02 NOTE — Telephone Encounter (Signed)
-----   Message from Marvia Pickles, PA-C sent at 04/01/2022 12:37 PM EST ----- Please let her know her bone density scan is normal.  Thank you

## 2022-04-02 NOTE — Telephone Encounter (Signed)
Message left for patient bone density test normal.

## 2022-05-08 ENCOUNTER — Other Ambulatory Visit: Payer: Self-pay | Admitting: Hematology and Oncology

## 2022-05-08 MED ORDER — ZOLPIDEM TARTRATE 5 MG PO TABS: 5.0000 mg | ORAL_TABLET | Freq: Every evening | ORAL | 0 refills | Status: DC | PRN

## 2022-05-30 ENCOUNTER — Other Ambulatory Visit: Payer: Medicare PPO

## 2022-05-30 ENCOUNTER — Ambulatory Visit: Payer: Medicare PPO | Admitting: Oncology

## 2022-06-04 ENCOUNTER — Telehealth: Payer: Self-pay

## 2022-06-04 NOTE — Telephone Encounter (Signed)
Patient and pharmacy made aware no refills on zolpidem tartate until after patient sees  provider per [facility], patient has no showed in the past.

## 2022-06-11 ENCOUNTER — Other Ambulatory Visit: Payer: Self-pay | Admitting: Oncology

## 2022-06-11 ENCOUNTER — Inpatient Hospital Stay: Payer: Medicare PPO | Attending: Oncology

## 2022-06-11 ENCOUNTER — Inpatient Hospital Stay (INDEPENDENT_AMBULATORY_CARE_PROVIDER_SITE_OTHER): Payer: Medicare PPO | Admitting: Oncology

## 2022-06-11 ENCOUNTER — Encounter: Payer: Self-pay | Admitting: Oncology

## 2022-06-11 VITALS — BP 112/62 | HR 93 | Temp 98.7°F | Resp 18 | Ht 61.0 in | Wt 146.1 lb

## 2022-06-11 DIAGNOSIS — M810 Age-related osteoporosis without current pathological fracture: Secondary | ICD-10-CM | POA: Diagnosis not present

## 2022-06-11 DIAGNOSIS — C50912 Malignant neoplasm of unspecified site of left female breast: Secondary | ICD-10-CM | POA: Diagnosis not present

## 2022-06-11 DIAGNOSIS — C50919 Malignant neoplasm of unspecified site of unspecified female breast: Secondary | ICD-10-CM

## 2022-06-11 DIAGNOSIS — G4700 Insomnia, unspecified: Secondary | ICD-10-CM

## 2022-06-11 DIAGNOSIS — R918 Other nonspecific abnormal finding of lung field: Secondary | ICD-10-CM

## 2022-06-11 DIAGNOSIS — G620 Drug-induced polyneuropathy: Secondary | ICD-10-CM

## 2022-06-11 LAB — BASIC METABOLIC PANEL
BUN: 22 — AB (ref 4–21)
CO2: 28 — AB (ref 13–22)
Chloride: 104 (ref 99–108)
Creatinine: 0.8 (ref 0.5–1.1)
Glucose: 175
Potassium: 3.9 mEq/L (ref 3.5–5.1)
Sodium: 140 (ref 137–147)

## 2022-06-11 LAB — CBC AND DIFFERENTIAL
HCT: 36 (ref 36–46)
Hemoglobin: 11.9 — AB (ref 12.0–16.0)
Neutrophils Absolute: 5.48
Platelets: 313 10*3/uL (ref 150–400)
WBC: 7.3

## 2022-06-11 LAB — HEPATIC FUNCTION PANEL
ALT: 20 U/L (ref 7–35)
AST: 28 (ref 13–35)
Alkaline Phosphatase: 65 (ref 25–125)
Bilirubin, Total: 0.6

## 2022-06-11 LAB — COMPREHENSIVE METABOLIC PANEL
Albumin: 4.6 (ref 3.5–5.0)
Calcium: 10.1 (ref 8.7–10.7)

## 2022-06-11 LAB — CBC: RBC: 3.78 — AB (ref 3.87–5.11)

## 2022-06-11 MED ORDER — ZOLPIDEM TARTRATE 5 MG PO TABS: 5.0000 mg | ORAL_TABLET | Freq: Every evening | ORAL | 0 refills | Status: DC | PRN

## 2022-06-11 MED ORDER — GABAPENTIN 300 MG PO CAPS: 300.0000 mg | ORAL_CAPSULE | Freq: Three times a day (TID) | ORAL | 3 refills | Status: AC

## 2022-06-11 NOTE — Progress Notes (Signed)
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 This is felt to most likely represent a recurrence rather than a 2nd breast primary.  However, this is now HER 2 positive in addition to ER positive.  She completed 6 cycles of neoadjuvant TCHP in July.  After 4 cycles, the doses were reduced by 25%, but she still was experiencing adverse effects. She completed chemotherapy. She received three cycles of HER2 targeted therapy prior to reconstructive surgery. She received 11 postop doses of Herceptin/Perjeta.   Severe osteoarthritis  This is especially of the bilateral knees and she has severe bone on bone arthritis.  She will need bilateral total knee replacements, and the first one is scheduled for February 6th, 2024.  The orthopedic surgeon has explained to her that this this not caused by her treatments.   ATM gene mutation.   She has had bilateral mastectomies.  She is also at increased risk for pancreatic cancer but there are no standardized screening methods.  Hypomagnesemia   Her magnesium is mildly low despite taking a supplement once daily, so I will have her increase to twice daily.  Pulmonary nodules She has tiny bilateral pulmonary nodules at both lower lobes,  measuring 4 mm and 3 mm. I will follow up.   Plan:  Her labs are sent upstairs today and I will call her with the results. I will see her every 4 months with a CT of the chest scan, CBC, CMP and magnesium level. I will refill her Ambien and Gabapentin. She continues taking Letrozole. The patient understands the plans discussed today and is in agreement with them.  She knows to contact our office if she develops concerns prior to her next appointment.  I provided 15 minutes of face-to-face time during this encounter and > 50% was spent counseling as documented under my assessment and plan.   [name], MD  [facility] AT [facility] [address] [place] [address] Dept: [phone] Dept Fax: [phone]       CHIEF COMPLAINT:  CC: A 72 year old female with history of breast cancer here for 3 week evaluation.  Current Treatment:  Trastuzumab/ Pertuzumab  INTERVAL HISTORY:  Tammy Fowler is here today for repeat clinical assessment for her history of breast cancer. Patient states that she is well but does have knee pain. She has a upcoming knee replacement surgery on 06/24/2022 and the other in May, 2024. Patient is concerned about the spots shown in her lungs and we discussed this, and I reassured her these are tiny at 3-77m and so not even detectable on a PET scan. We will repeat a scan in the spring. I advised her she can continue to take Letrozole through surgery and she will begin to take blood thinner a day after her first surgery. She has had her port removed.  Her labs are sent upstairs today and I will call her with the results. I will see her every 4 months with a CT of the chest scan, CBC, and CMP. I will refill her Ambien and Gabapentin.She denies signs of infection such as sore throat, sinus drainage, cough, or urinary symptoms.  She denies fevers or recurrent chills. She denies pain. She denies nausea, vomiting, chest pain,  dyspnea or cough. Her weight has increased 9 pounds over last 3 months .  I have reviewed the past medical history, past surgical history, social history and family history with the patient and they are unchanged from previous note.  ALLERGIES:  is allergic to oxycodone, codeine, and lisinopril.  MEDICATIONS:  Current Outpatient Medications  Medication Sig Dispense Refill   acetaminophen (TYLENOL) 650 MG CR tablet Take 650 mg by mouth every 8 (eight) hours as needed for pain.     aspirin 81 MG chewable tablet Chew by mouth.     CALCIUM CITRATE-VITAMIN D PO Take 2 tablets by mouth daily at 12 noon. '500mg'$  of calcium with vitamin D     Docusate Sodium (DSS) 100 MG CAPS Take by mouth.     DULoxetine (CYMBALTA) 60 MG capsule Take 1 capsule by mouth daily.     JARDIANCE 10 MG TABS tablet      letrozole (FEMARA) 2.5 MG tablet Take 1 tablet (2.5 mg total) by mouth daily. 30 tablet 5   losartan (COZAAR) 50 MG tablet daily.     magnesium oxide (MAG-OX) 400 (241.3 Mg) MG tablet Take 1 tablet (400 mg total) by mouth 2 (two) times daily. 60 tablet 1   rOPINIRole (REQUIP) 0.5 MG tablet Take by mouth.     rosuvastatin (CRESTOR) 40 MG tablet Take by mouth.     gabapentin (NEURONTIN) 300 MG capsule Take 1 capsule (300 mg total) by mouth 3 (three) times daily. 2 tablets in the morning and 1 tablet at night 90 capsule 3   zolpidem (AMBIEN) 5 MG tablet Take 1 tablet (5 mg total) by mouth at bedtime as needed for sleep. 30 tablet 0   No current facility-administered medications for this visit.    HISTORY OF PRESENT ILLNESS:   Oncology History  Malignant neoplasm of lower-outer quadrant of left breast of female, estrogen receptor positive ([place])  11/03/2002 Initial Diagnosis   Breast cancer in female [facility])   11/03/2002 Cancer Staging   Staging form: Breast, AJCC 6th Edition - Clinical stage from 11/03/2002: Stage IIIA (T2, N2, M0) - Signed by [name], MD on 06/04/2020 Staging comments: Treated  with AC x4, Taxol x4, anastrazole x 10 years, completed April 2015 Prognostic indicators: ER/PR pos, HER2 neg, age 54 at dx, had re-excision for pos margin   Local recurrence of cancer of left breast ([place])  06/08/2020 Initial Diagnosis   Local recurrence of cancer of right breast ([place])   06/20/2020 Cancer Staging   Staging form: Breast, AJCC 8th Edition - Clinical stage from 06/20/2020: Stage IIA (rcT2, cN0, cM0, G3, ER+, PR-, HER2+) - Signed by [name], MD on 06/27/2020 Histopathologic type: Carcinoma, NOS Stage prefix: Recurrence Method of lymph node assessment: Clinical Nuclear grade: G3 Multigene prognostic tests performed: None Menopausal status: Postmenopausal Ki-67 (%): 25 Stage used in treatment planning: Yes National guidelines used in treatment planning: Yes Type of national guideline used in treatment planning: NCCN Staging comments: 3.2 total size, incl satellite lesion.  Arising in adipose tissue so susp for recurrence but now HER @ positive  07/03/2020 - 10/22/2020 Chemotherapy    Patient is on Treatment Plan: BREAST TRASTUZUMAB + PERTUZUMAB Q21D X 11 CYCLES      11/08/2020 - 10/15/2021 Chemotherapy   Patient is on Treatment Plan : BREAST Trastuzumab + Pertuzumab q21d x 11 cycles     12/13/2020 Cancer Staging   Staging form: Breast, AJCC 8th Edition - Pathologic stage from 12/13/2020: No Stage Recommended (ypT1c, pN0, cM0, G2, ER+, PR-, HER2+) - Signed by [name], MD on 01/10/2021 Histopathologic type: Carcinoma, NOS Stage prefix: Post-therapy Response to neoadjuvant therapy: Partial response Method of lymph node assessment: Clinical Nuclear grade: G2 Multigene prognostic tests performed: None Histologic grading system: 3 grade system Residual tumor (R): R0 - None Laterality: Left Tumor size (mm): 15 Lymph-vascular invasion (LVI): LVI not present (absent)/not identified Diagnostic confirmation: Positive histology Specimen type: Excision Staged  by: Managing physician Menopausal status: Postmenopausal Ki-67 (%): 25 Stage used in treatment planning: Yes National guidelines used in treatment planning: Yes Type of national guideline used in treatment planning: NCCN   Hypomagnesemia      REVIEW OF SYSTEMS:  Review of Systems  Constitutional: Negative.  Negative for appetite change, chills, diaphoresis, fatigue, fever and unexpected weight change.  HENT:  Negative.  Negative for hearing loss, lump/mass, mouth sores, nosebleeds, sore throat, tinnitus, trouble swallowing and voice change.   Eyes: Negative.  Negative for eye problems and icterus.  Respiratory: Negative.  Negative for chest tightness, cough, hemoptysis, shortness of breath and wheezing.   Cardiovascular: Negative.  Negative for chest pain, leg swelling and palpitations.  Gastrointestinal: Negative.  Negative for abdominal distention, abdominal pain, blood in stool, constipation, diarrhea, nausea, rectal pain and vomiting.  Endocrine: Negative.  Negative for hot flashes.  Genitourinary: Negative.  Negative for bladder incontinence, difficulty urinating, dyspareunia, dysuria, frequency, hematuria, menstrual problem, nocturia, pelvic pain, vaginal bleeding and vaginal discharge.   Musculoskeletal:  Positive for arthralgias. Negative for back pain, flank pain, gait problem, myalgias, neck pain and neck stiffness.  Skin: Negative.  Negative for itching, rash and wound.  Neurological:  Negative for extremity weakness, gait problem, headaches, light-headedness, numbness, seizures and speech difficulty.  Hematological: Negative.  Negative for adenopathy. Does not bruise/bleed easily.  Psychiatric/Behavioral: Negative.  Negative for confusion, decreased concentration, depression, sleep disturbance and suicidal ideas. The patient is not nervous/anxious.    VITALS:  Blood pressure 112/62, pulse 93, temperature 98.7 F (37.1 C), temperature source Oral, resp. rate 18, height '5\' 1"'$   (1.549 m), weight 146 lb 1.6 oz (66.3 kg), SpO2 100 %.  Wt Readings from Last 3 Encounters:  06/11/22 146 lb 1.6 oz (66.3 kg)  02/27/22 137 lb (62.1 kg)  01/23/22 133 lb 4.8 oz (60.5 kg)    Body mass index is 27.61 kg/m.  Performance status (ECOG): 1 - Symptomatic but completely ambulatory  PHYSICAL EXAM:  Physical Exam Constitutional:      General: She is not in acute distress.    Appearance: Normal appearance. She is normal weight. She is not ill-appearing, toxic-appearing or diaphoretic.  HENT:     Head: Normocephalic and atraumatic.     Right Ear: Tympanic membrane, ear canal and external ear normal. There is no impacted cerumen.     Left Ear: Tympanic membrane, ear canal and external ear normal. There is no impacted cerumen.     Nose: Nose normal. No congestion or rhinorrhea.     Mouth/Throat:     Mouth: Mucous membranes are moist.     Pharynx: Oropharynx is clear.  No oropharyngeal exudate or posterior oropharyngeal erythema.  Eyes:     General: No scleral icterus.       Right eye: No discharge.        Left eye: No discharge.     Extraocular Movements: Extraocular movements intact.     Conjunctiva/sclera: Conjunctivae normal.     Pupils: Pupils are equal, round, and reactive to light.  Neck:     Vascular: No carotid bruit.  Cardiovascular:     Rate and Rhythm: Normal rate and regular rhythm.     Pulses: Normal pulses.     Heart sounds: Normal heart sounds. No murmur heard.    No friction rub. No gallop.  Pulmonary:     Effort: Pulmonary effort is normal. No respiratory distress.     Breath sounds: No stridor. Examination of the right-upper field reveals wheezing. Wheezing present. No rhonchi or rales.     Comments: Tiny wheeze in the right upper lobe. Chest:     Chest wall: No tenderness.  Breasts:    Right: Absent.     Left: Absent.     Comments: Bilateral reconstructions are negative.  Abdominal:     General: Bowel sounds are normal. There is no distension.      Palpations: Abdomen is soft. There is no hepatomegaly, splenomegaly or mass.     Tenderness: There is no abdominal tenderness. There is no right CVA tenderness, left CVA tenderness, guarding or rebound.     Hernia: No hernia is present.  Musculoskeletal:        General: No swelling, tenderness, deformity or signs of injury. Normal range of motion.     Cervical back: Normal range of motion and neck supple. No rigidity or tenderness.     Right lower leg: No edema.     Left lower leg: No edema.  Lymphadenopathy:     Cervical: No cervical adenopathy.     Right cervical: No superficial, deep or posterior cervical adenopathy.    Left cervical: No superficial, deep or posterior cervical adenopathy.     Upper Body:     Right upper body: No supraclavicular, axillary or pectoral adenopathy.     Left upper body: No supraclavicular, axillary or pectoral adenopathy.  Skin:    General: Skin is warm and dry.     Coloration: Skin is not jaundiced or pale.     Findings: No bruising, erythema, lesion or rash.  Neurological:     General: No focal deficit present.     Mental Status: She is alert and oriented to person, place, and time. Mental status is at baseline.     Cranial Nerves: No cranial nerve deficit.     Sensory: No sensory deficit.     Motor: No weakness.     Coordination: Coordination normal.     Gait: Gait normal.     Deep Tendon Reflexes: Reflexes normal.  Psychiatric:        Mood and Affect: Mood normal.        Behavior: Behavior normal.        Thought Content: Thought content normal.        Judgment: Judgment normal.    Breast: Bilateral reconstructions are negative.   LABORATORY DATA:  I have reviewed the data as listed    Component Value Date/Time   NA 140 06/11/2022 0000   K 3.9 06/11/2022 0000   CL 104 06/11/2022 0000   CO2 28 (A) 06/11/2022 0000   GLUCOSE 199 (H) 04/25/2021 1035  BUN 22 (A) 06/11/2022 0000   CREATININE 0.8 06/11/2022 0000   CREATININE 0.69  04/25/2021 1035   CALCIUM 10.1 06/11/2022 0000   ALBUMIN 4.6 06/11/2022 0000   AST 28 06/11/2022 0000   ALT 20 06/11/2022 0000   ALKPHOS 65 06/11/2022 0000   GFRNONAA >60 04/25/2021 1035    No results found for: "SPEP", "UPEP"  Lab Results  Component Value Date   WBC 7.3 06/11/2022   NEUTROABS 5.48 06/11/2022   HGB 11.9 (A) 06/11/2022   HCT 36 06/11/2022   MCV 94 01/23/2022   PLT 313 06/11/2022      Chemistry      Component Value Date/Time   NA 140 06/11/2022 0000   K 3.9 06/11/2022 0000   CL 104 06/11/2022 0000   CO2 28 (A) 06/11/2022 0000   BUN 22 (A) 06/11/2022 0000   CREATININE 0.8 06/11/2022 0000   CREATININE 0.69 04/25/2021 1035   GLU 175 06/11/2022 0000      Component Value Date/Time   CALCIUM 10.1 06/11/2022 0000   ALKPHOS 65 06/11/2022 0000   AST 28 06/11/2022 0000   ALT 20 06/11/2022 0000       Ref Range & Units   04/21/2022  Sodium 136 - 144 mmol/L 137  Potassium 3.5 - 5.0 mmol/L 4.3  Chloride 98 - 108 mmol/L 101  CO2 22 - 29 mmol/L 27  BUN 8 - 20 mg/dl 20  Creatinine 0.57 - 1.11 mg/dL 0.83  Calcium 8.4 - 10.3 mg/dl 10.3  Glucose 70 - 140 mg/dL 115     Ref Range & Units   04/21/2022  Magnesium 1.8 - 2.6 mg/dl 1.9     RADIOGRAPHIC STUDIES:       I,Jasmine M Lassiter,acting as a scribe for [name], MD.,have documented all relevant documentation on the behalf of [name], MD,as directed by  [name], MD while in the presence of [name], MD.

## 2022-06-12 ENCOUNTER — Telehealth: Payer: Self-pay

## 2022-06-12 NOTE — Telephone Encounter (Signed)
-----  Message from [name], MD sent at 06/11/2022  7:26 PM EST ----- Regarding: call Tell her labs look good, liver and kidney tests all normal, hgb 11.9, BS 175, just sl dry with BUN 22 (EGFR over 60)

## 2022-06-12 NOTE — Telephone Encounter (Signed)
Patient notified of lab results

## 2022-06-26 ENCOUNTER — Other Ambulatory Visit: Payer: Self-pay | Admitting: Oncology

## 2022-08-14 ENCOUNTER — Other Ambulatory Visit: Payer: Self-pay | Admitting: Oncology

## 2022-08-14 DIAGNOSIS — G4700 Insomnia, unspecified: Secondary | ICD-10-CM

## 2022-08-14 MED ORDER — ZOLPIDEM TARTRATE 5 MG PO TABS: 5.0000 mg | ORAL_TABLET | Freq: Every evening | ORAL | 0 refills | Status: DC | PRN

## 2022-09-03 ENCOUNTER — Other Ambulatory Visit: Payer: Self-pay | Admitting: Hematology and Oncology

## 2022-09-03 DIAGNOSIS — C50912 Malignant neoplasm of unspecified site of left female breast: Secondary | ICD-10-CM

## 2022-09-10 ENCOUNTER — Telehealth: Payer: Self-pay | Admitting: Oncology

## 2022-09-10 NOTE — Telephone Encounter (Signed)
CT C/A/P has been scheduled for 09/24/22 @ 10:30; Checking in @ 9:30   Notified pt of date,time and instructions.

## 2022-09-22 ENCOUNTER — Other Ambulatory Visit: Payer: Self-pay

## 2022-09-22 DIAGNOSIS — G4700 Insomnia, unspecified: Secondary | ICD-10-CM

## 2022-09-23 MED ORDER — ZOLPIDEM TARTRATE 5 MG PO TABS: 5.0000 mg | ORAL_TABLET | Freq: Every evening | ORAL | 0 refills | Status: DC | PRN

## 2022-09-24 ENCOUNTER — Ambulatory Visit: Payer: Medicare PPO | Admitting: Oncology

## 2022-09-24 LAB — BASIC METABOLIC PANEL
BUN: 22 — AB (ref 4–21)
CO2: 26 — AB (ref 13–22)
Chloride: 108 (ref 99–108)
Creatinine: 0.6 (ref 0.5–1.1)
Glucose: 130
Potassium: 3.9 mEq/L (ref 3.5–5.1)
Sodium: 138 (ref 137–147)

## 2022-09-24 LAB — CBC AND DIFFERENTIAL
HCT: 36 (ref 36–46)
Hemoglobin: 12 (ref 12.0–16.0)
Neutrophils Absolute: 2.65
Platelets: 254 10*3/uL (ref 150–400)
WBC: 5

## 2022-09-24 LAB — HEPATIC FUNCTION PANEL
ALT: 21 U/L (ref 7–35)
AST: 24 (ref 13–35)
Alkaline Phosphatase: 74 (ref 25–125)
Bilirubin, Total: 0.4

## 2022-09-24 LAB — CBC: RBC: 3.99 (ref 3.87–5.11)

## 2022-09-24 LAB — COMPREHENSIVE METABOLIC PANEL
Albumin: 4 (ref 3.5–5.0)
Calcium: 9.2 (ref 8.7–10.7)

## 2022-09-25 HISTORY — PX: CATARACT EXTRACTION: SUR2

## 2022-09-26 ENCOUNTER — Ambulatory Visit: Payer: Medicare PPO | Admitting: Oncology

## 2022-10-01 NOTE — Progress Notes (Signed)
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 This is felt to most likely represent a recurrence rather than a 2nd breast primary.  However, this is now HER 2 positive in addition to ER positive.  She completed 6 cycles of neoadjuvant TCHP in July.  After 4 cycles, the doses were reduced by 25%, but she still was experiencing adverse effects. She completed chemotherapy. She received three cycles of HER2 targeted therapy prior to reconstructive surgery. She received 11 postop doses of Herceptin/Perjeta.   Severe osteoarthritis  This is especially of the bilateral knees and she has severe bone on bone arthritis.  She will need bilateral total knee replacements, and she is recovering from the one one done on February 6th, 2024.  The orthopedic surgeon has explained to her that this this not caused by her treatments. Her arthralgias are improved.    ATM gene mutation.   She has had bilateral mastectomies.  She is also at increased risk for pancreatic cancer but there are no standardized screening methods.  Pulmonary nodules She has tiny bilateral pulmonary nodules at both lower lobes, measuring 4 mm and 3 mm. I will follow up.  Plan:  She has an appointment with her PCP in 2  weeks and will discuss increasing her Jardiance 10 mg to 15 mg. I will refill Letrozole 2.5mg . She had a CT of the chest on 09/24/2022 which revealed stable 3-88mm pulmonary nodules in the bilateral lower lobes. Given 6 month stability, these are not considered suspicious for metastasis. I may repeat chest CT again next year. Her CBC and CMP are normal. I will see her back in 6 months with CBC and CMP. The patient understands the plans discussed today and is in agreement with them.  She knows to contact our office if she develops concerns prior to her next appointment.  I provided 15 minutes of face-to-face time during this encounter and > 50% was spent counseling as documented under my assessment and plan.   Dellia Beckwith, MD  [facility] AT [facility] [address] [place] [address] Dept: [phone] Dept Fax: [phone]   CHIEF COMPLAINT:  CC: A 72 year old female with history of breast cancer here for 3 week evaluation.  Current Treatment:  Trastuzumab/ Pertuzumab  INTERVAL HISTORY:  Tammy Fowler is here today for repeat clinical assessment for her history of breast cancer. Patient states that she is well and has no complaints of pain. She has an appointment with her PCP in 2 weeks and will discuss increasing her Jardiance 10 mg to 15 mg. I will refill Letrozole 2.5mg . She had a CT of the chest on 09/24/2022 which revealed stable 3-4 mm pulmonary nodules in the bilateral lower lobes. Given 6 month stability, these are not considered suspicious for metastasis. Her CBC and CMP are normal. I will see her back  in 6 months with CBC and CMP.  She denies signs of infection such as sore throat, sinus drainage, cough, or urinary symptoms.  She denies fevers or recurrent chills. She denies pain. She denies nausea, vomiting, chest pain, dyspnea or cough. Her appetite is good and her weight has decreased 10 pounds over last 4 months .   I have  reviewed the past medical history, past surgical history, social history and family history with the patient and they are unchanged from previous note.  ALLERGIES:  is allergic to oxycodone, codeine, and lisinopril.  MEDICATIONS:  Current Outpatient Medications  Medication Sig Dispense Refill   ketorolac (ACULAR) 0.5 % ophthalmic solution      ofloxacin (OCUFLOX) 0.3 % ophthalmic solution      oxyCODONE (OXY IR/ROXICODONE) 5 MG immediate release tablet      prednisoLONE acetate (PRED FORTE) 1 % ophthalmic suspension      acetaminophen (TYLENOL) 650 MG CR tablet Take 650 mg by mouth every 8 (eight) hours as needed for pain.     aspirin 81 MG chewable tablet Chew by mouth.     CALCIUM CITRATE-VITAMIN D PO Take 2 tablets by mouth daily at 12 noon. 500mg  of calcium with vitamin D     Docusate Sodium (DSS) 100 MG CAPS Take by mouth.     DULoxetine (CYMBALTA) 60 MG capsule Take 1 capsule by mouth daily.     gabapentin (NEURONTIN) 300 MG capsule Take 1 capsule (300 mg total) by mouth 3 (three) times daily. 2 tablets in the morning and 1 tablet at night 90 capsule 3   JARDIANCE 10 MG TABS tablet      letrozole (FEMARA) 2.5 MG tablet Take 1 tablet (2.5 mg total) by mouth daily. 90 tablet 2   losartan (COZAAR) 50 MG tablet daily.     magnesium oxide (MAG-OX) 400 (241.3 Mg) MG tablet Take 1 tablet (400 mg total) by mouth 2 (two) times daily. 60 tablet 1   rOPINIRole (REQUIP) 0.5 MG tablet Take by mouth.     rosuvastatin (CRESTOR) 40 MG tablet Take by mouth.     zolpidem (AMBIEN) 5 MG tablet Take 1 tablet (5 mg total) by mouth at bedtime as needed for sleep. 30 tablet 0   No current facility-administered medications for this visit.    HISTORY OF PRESENT ILLNESS:   Oncology History  Malignant neoplasm of lower-outer quadrant of left breast of female, estrogen receptor positive (HCC)  11/03/2002 Initial Diagnosis   Breast cancer in female [facility])   11/03/2002 Cancer Staging   Staging form: Breast,  AJCC 6th Edition - Clinical stage from 11/03/2002: Stage IIIA (T2, N2, M0) - Signed by Dellia Beckwith, MD on 06/04/2020 Staging comments: Treated with AC x4, Taxol x4, anastrazole x 10 years, completed April 2015 Prognostic indicators: ER/PR pos, HER2 neg, age 47 at dx, had re-excision for pos margin   Local recurrence of cancer of left breast (HCC)  06/08/2020 Initial Diagnosis   Local recurrence of cancer of right breast (HCC)   06/20/2020 Cancer Staging   Staging form: Breast, AJCC 8th Edition - Clinical stage from 06/20/2020: Stage IIA (rcT2, cN0, cM0, G3, ER+, PR-, HER2+) - Signed by Dellia Beckwith, MD on 06/27/2020 Histopathologic type: Carcinoma, NOS Stage prefix: Recurrence Method of lymph node assessment: Clinical Nuclear grade: G3 Multigene prognostic tests performed: None Menopausal status: Postmenopausal Ki-67 (%): 25 Stage used in treatment planning: Yes National guidelines used in treatment planning: Yes Type of national guideline used in  treatment planning: NCCN Staging comments: 3.2 total size, incl satellite lesion.  Arising in adipose tissue so susp for recurrence but now HER @ positive   07/03/2020 - 10/22/2020 Chemotherapy    Patient is on Treatment Plan: BREAST TRASTUZUMAB + PERTUZUMAB Q21D X 11 CYCLES      11/08/2020 - 10/15/2021 Chemotherapy   Patient is on Treatment Plan : BREAST Trastuzumab + Pertuzumab q21d x 11 cycles     12/13/2020 Cancer Staging   Staging form: Breast, AJCC 8th Edition - Pathologic stage from 12/13/2020: No Stage Recommended (ypT1c, pN0, cM0, G2, ER+, PR-, HER2+) - Signed by Dellia Beckwith, MD on 01/10/2021 Histopathologic type: Carcinoma, NOS Stage prefix: Post-therapy Response to neoadjuvant therapy: Partial response Method of lymph node assessment: Clinical Nuclear grade: G2 Multigene prognostic tests performed: None Histologic grading system: 3 grade system Residual tumor (R): R0 - None Laterality: Left Tumor size (mm):  15 Lymph-vascular invasion (LVI): LVI not present (absent)/not identified Diagnostic confirmation: Positive histology Specimen type: Excision Staged by: Managing physician Menopausal status: Postmenopausal Ki-67 (%): 25 Stage used in treatment planning: Yes National guidelines used in treatment planning: Yes Type of national guideline used in treatment planning: NCCN   Hypomagnesemia    REVIEW OF SYSTEMS:  Review of Systems  Constitutional: Negative.  Negative for appetite change, chills, diaphoresis, fatigue, fever and unexpected weight change.  HENT:  Negative.  Negative for hearing loss, lump/mass, mouth sores, nosebleeds, sore throat, tinnitus, trouble swallowing and voice change.   Eyes: Negative.  Negative for eye problems and icterus.  Respiratory: Negative.  Negative for chest tightness, cough, hemoptysis, shortness of breath and wheezing.   Cardiovascular: Negative.  Negative for chest pain, leg swelling and palpitations.  Gastrointestinal: Negative.  Negative for abdominal distention, abdominal pain, blood in stool, constipation, diarrhea, nausea, rectal pain and vomiting.  Endocrine: Negative.  Negative for hot flashes.  Genitourinary: Negative.  Negative for bladder incontinence, difficulty urinating, dyspareunia, dysuria, frequency, hematuria, menstrual problem, nocturia, pelvic pain, vaginal bleeding and vaginal discharge.   Musculoskeletal:  Positive for arthralgias (knee, improved). Negative for back pain, flank pain, gait problem, myalgias, neck pain and neck stiffness.  Skin: Negative.  Negative for itching, rash and wound.  Neurological:  Negative for extremity weakness, gait problem, headaches, light-headedness, numbness, seizures and speech difficulty.  Hematological: Negative.  Negative for adenopathy. Does not bruise/bleed easily.  Psychiatric/Behavioral: Negative.  Negative for confusion, decreased concentration, depression, sleep disturbance and suicidal ideas. The  patient is not nervous/anxious.    VITALS:  Blood pressure 107/67, pulse 91, temperature 98 F (36.7 C), temperature source Oral, resp. rate 18, height 5\' 1"  (1.549 m), weight 136 lb 11.2 oz (62 kg), SpO2 97 %.  Wt Readings from Last 3 Encounters:  10/02/22 136 lb 11.2 oz (62 kg)  06/11/22 146 lb 1.6 oz (66.3 kg)  02/27/22 137 lb (62.1 kg)    Body mass index is 25.83 kg/m.  Performance status (ECOG): 1 - Symptomatic but completely ambulatory  PHYSICAL EXAM:  Physical Exam Constitutional:      General: She is not in acute distress.    Appearance: Normal appearance. She is normal weight. She is not ill-appearing, toxic-appearing or diaphoretic.  HENT:     Head: Normocephalic and atraumatic.     Right Ear: Tympanic membrane, ear canal and external ear normal. There is no impacted cerumen.     Left Ear: Tympanic membrane, ear canal and external ear normal. There is no impacted cerumen.     Nose: Nose  normal. No congestion or rhinorrhea.     Mouth/Throat:     Mouth: Mucous membranes are moist.     Pharynx: Oropharynx is clear. No oropharyngeal exudate or posterior oropharyngeal erythema.  Eyes:     General: No scleral icterus.       Right eye: No discharge.        Left eye: No discharge.     Extraocular Movements: Extraocular movements intact.     Conjunctiva/sclera: Conjunctivae normal.     Pupils: Pupils are equal, round, and reactive to light.  Neck:     Vascular: No carotid bruit.  Cardiovascular:     Rate and Rhythm: Normal rate and regular rhythm.     Pulses: Normal pulses.     Heart sounds: Normal heart sounds. No murmur heard.    No friction rub. No gallop.  Pulmonary:     Effort: Pulmonary effort is normal. No respiratory distress.     Breath sounds: Normal breath sounds. No stridor. No wheezing, rhonchi or rales.  Chest:     Chest wall: No tenderness.  Breasts:    Right: Absent.     Left: Absent.     Comments: Bilateral reconstructions are negative.  Abdominal:      General: Bowel sounds are normal. There is no distension.     Palpations: Abdomen is soft. There is no hepatomegaly, splenomegaly or mass.     Tenderness: There is no abdominal tenderness. There is no right CVA tenderness, left CVA tenderness, guarding or rebound.     Hernia: No hernia is present.  Musculoskeletal:        General: No swelling, tenderness, deformity or signs of injury. Normal range of motion.     Cervical back: Normal range of motion and neck supple. No rigidity or tenderness.     Right lower leg: No edema.     Left lower leg: No edema.  Lymphadenopathy:     Cervical: No cervical adenopathy.     Right cervical: No superficial, deep or posterior cervical adenopathy.    Left cervical: No superficial, deep or posterior cervical adenopathy.     Upper Body:     Right upper body: No supraclavicular, axillary or pectoral adenopathy.     Left upper body: No supraclavicular, axillary or pectoral adenopathy.  Skin:    General: Skin is warm and dry.     Coloration: Skin is not jaundiced or pale.     Findings: No bruising, erythema, lesion or rash.  Neurological:     General: No focal deficit present.     Mental Status: She is alert and oriented to person, place, and time. Mental status is at baseline.     Cranial Nerves: No cranial nerve deficit.     Sensory: No sensory deficit.     Motor: No weakness.     Coordination: Coordination normal.     Gait: Gait normal.     Deep Tendon Reflexes: Reflexes normal.  Psychiatric:        Mood and Affect: Mood normal.        Behavior: Behavior normal.        Thought Content: Thought content normal.        Judgment: Judgment normal.     LABORATORY DATA:  I have reviewed the data as listed    Component Value Date/Time   NA 138 09/24/2022 0000   K 3.9 09/24/2022 0000   CL 108 09/24/2022 0000   CO2 26 (A) 09/24/2022 0000   GLUCOSE  199 (H) 04/25/2021 1035   BUN 22 (A) 09/24/2022 0000   CREATININE 0.6 09/24/2022 0000    CREATININE 0.69 04/25/2021 1035   CALCIUM 9.2 09/24/2022 0000   ALBUMIN 4.0 09/24/2022 0000   AST 24 09/24/2022 0000   ALT 21 09/24/2022 0000   ALKPHOS 74 09/24/2022 0000   GFRNONAA >60 04/25/2021 1035    No results found for: "SPEP", "UPEP"  Lab Results  Component Value Date   WBC 5.0 09/24/2022   NEUTROABS 2.65 09/24/2022   HGB 12.0 09/24/2022   HCT 36 09/24/2022   MCV 94 01/23/2022   PLT 254 09/24/2022      Chemistry      Component Value Date/Time   NA 138 09/24/2022 0000   K 3.9 09/24/2022 0000   CL 108 09/24/2022 0000   CO2 26 (A) 09/24/2022 0000   BUN 22 (A) 09/24/2022 0000   CREATININE 0.6 09/24/2022 0000   CREATININE 0.69 04/25/2021 1035   GLU 130 09/24/2022 0000      Component Value Date/Time   CALCIUM 9.2 09/24/2022 0000   ALKPHOS 74 09/24/2022 0000   AST 24 09/24/2022 0000   ALT 21 09/24/2022 0000      RADIOGRAPHIC STUDIES: Exam: 09/24/2022 Ct Chest with Contrast Impression:  Status post bilateral mastectomy with reconstruction Stable 3-61mm pulmonary nodules in the bilateral lower lobes. Given 6 month stability, these are not considered suspicious for metastasis. Consider attention on routine follow-up surveillance imaging as clinically warranted. No findings suspicious for metastatic disease.  Aortic Atherosclerosis (ICD10-170.0)         I,Jasmine M Lassiter,acting as a scribe for Dellia Beckwith, MD.,have documented all relevant documentation on the behalf of Dellia Beckwith, MD,as directed by  Dellia Beckwith, MD while in the presence of Dellia Beckwith, MD.

## 2022-10-02 ENCOUNTER — Inpatient Hospital Stay: Payer: Medicare PPO | Attending: Oncology | Admitting: Oncology

## 2022-10-02 ENCOUNTER — Other Ambulatory Visit: Payer: Self-pay | Admitting: Oncology

## 2022-10-02 ENCOUNTER — Encounter: Payer: Self-pay | Admitting: Oncology

## 2022-10-02 VITALS — BP 107/67 | HR 91 | Temp 98.0°F | Resp 18 | Ht 61.0 in | Wt 136.7 lb

## 2022-10-02 DIAGNOSIS — C50512 Malignant neoplasm of lower-outer quadrant of left female breast: Secondary | ICD-10-CM

## 2022-10-02 DIAGNOSIS — Z170 Estrogen receptor positive status [ER+]: Secondary | ICD-10-CM

## 2022-10-02 DIAGNOSIS — C50912 Malignant neoplasm of unspecified site of left female breast: Secondary | ICD-10-CM

## 2022-10-02 MED ORDER — LETROZOLE 2.5 MG PO TABS
2.5000 mg | ORAL_TABLET | Freq: Every day | ORAL | 2 refills | Status: DC
Start: 2022-10-02 — End: 2023-02-07

## 2022-10-03 ENCOUNTER — Telehealth: Payer: Self-pay | Admitting: *Deleted

## 2022-10-03 NOTE — Telephone Encounter (Signed)
10/03/22 spoke with patient and schedule next appt

## 2022-10-10 ENCOUNTER — Telehealth: Payer: Self-pay

## 2022-10-10 NOTE — Telephone Encounter (Signed)
-----   Message from Dellia Beckwith, MD sent at 10/10/2022  9:17 AM EDT ----- Regarding: call Tell her all labs look good incl kidney and liver, needs to drink more fluids, a little dry

## 2022-10-10 NOTE — Telephone Encounter (Signed)
Called patient and notified her of the lab results and that she needs to drink more fluids.

## 2022-10-15 ENCOUNTER — Encounter: Payer: Self-pay | Admitting: Oncology

## 2022-10-21 ENCOUNTER — Other Ambulatory Visit: Payer: Self-pay

## 2022-10-21 DIAGNOSIS — G4700 Insomnia, unspecified: Secondary | ICD-10-CM

## 2022-10-21 MED ORDER — ZOLPIDEM TARTRATE 5 MG PO TABS
5.0000 mg | ORAL_TABLET | Freq: Every evening | ORAL | 0 refills | Status: DC | PRN
Start: 2022-10-21 — End: 2023-03-06

## 2023-02-07 ENCOUNTER — Other Ambulatory Visit: Payer: Self-pay | Admitting: Oncology

## 2023-02-07 DIAGNOSIS — C50912 Malignant neoplasm of unspecified site of left female breast: Secondary | ICD-10-CM

## 2023-02-13 ENCOUNTER — Ambulatory Visit (INDEPENDENT_AMBULATORY_CARE_PROVIDER_SITE_OTHER): Payer: Medicare PPO | Admitting: Podiatry

## 2023-02-13 ENCOUNTER — Ambulatory Visit (INDEPENDENT_AMBULATORY_CARE_PROVIDER_SITE_OTHER): Payer: Medicare PPO

## 2023-02-13 DIAGNOSIS — L02611 Cutaneous abscess of right foot: Secondary | ICD-10-CM

## 2023-02-13 DIAGNOSIS — S99921A Unspecified injury of right foot, initial encounter: Secondary | ICD-10-CM

## 2023-02-13 DIAGNOSIS — L03031 Cellulitis of right toe: Secondary | ICD-10-CM

## 2023-02-13 MED ORDER — DOXYCYCLINE HYCLATE 100 MG PO CAPS: 100.0000 mg | ORAL_CAPSULE | Freq: Two times a day (BID) | ORAL | 0 refills | Status: DC

## 2023-02-13 NOTE — Progress Notes (Unsigned)
Chief Complaint  Patient presents with   Toe Injury    hit little toe on right foot couple of months ago and looks swollen/red   HPI: 72 y.o. female presents today noting that she stubbed her toe on a door at home approximately 6 months ago.  She has had swelling and pain in the toe for the past 6 months which is progressively worsening.  Her family practitioner placed her on antibiotics thinking it may have been infected and she has finished all of that.  Notes that she went on a hunting trip approximately 2 weeks ago and was fairly active.  States that there has been a spot that looks like it needs to drain and she did try to drain the area herself but not much fluid came out.  She notes that the fifth toenail did eventually fall off after her initial toe contusion.  Past Medical History:  Diagnosis Date   Cancer (HCC)    Depression    Diabetes mellitus without complication (HCC)    Hyperlipidemia    Hypomagnesemia 08/10/2020   Pulmonary nodules 01/23/2022   Sleep apnea     Past Surgical History:  Procedure Laterality Date   ABDOMINAL HYSTERECTOMY     BREAST RECONSTRUCTION WITH PLACEMENT OF TISSUE EXPANDER AND FLEX HD (ACELLULAR HYDRATED DERMIS) Bilateral 12/11/2020   Procedure: BILATERAL BREAST RECONSTRUCTION WITH PLACEMENT OF TISSUE EXPANDER AND FLEX HD (ACELLULAR HYDRATED DERMIS);  Surgeon: Allena Napoleon, MD;  Location: [facility];  Service: Plastics;  Laterality: Bilateral;   BREAST SURGERY Left    CATARACT EXTRACTION Left 09/25/2022   HYSTERECTOMY ABDOMINAL WITH SALPINGO-OOPHORECTOMY  07/2003   due to fibroids   NIPPLE SPARING MASTECTOMY Bilateral 12/11/2020   Procedure: BILATERAL NIPPLE SPARING MASTECTOMY;  Surgeon: Emelia Loron, MD;  Location: [facility];  Service: General;  Laterality: Bilateral;   PORTACATH PLACEMENT Right 06/26/2020   Procedure: INSERTION PORT-A-CATH;  Surgeon: Emelia Loron, MD;  Location: [facility];  Service: General;  Laterality: Right;   REMOVAL OF BILATERAL TISSUE EXPANDERS WITH PLACEMENT OF BILATERAL BREAST IMPLANTS Bilateral 04/30/2021   Procedure: REMOVAL OF BILATERAL TISSUE EXPANDERS WITH PLACEMENT OF BILATERAL BREAST IMPLANTS;  Surgeon: Allena Napoleon, MD;  Location:  SURGERY CENTER;  Service: Plastics;  Laterality: Bilateral;   TRIGGER FINGER RELEASE      Allergies  Allergen Reactions   Oxycodone Itching    Other reaction(s): Itching   Codeine Other (See Comments)    Other reaction(s): Other (See Comments) Unknown Unknown  Other reaction(s): Other (See Comments), Other (See Comments), Other (See Comments) Unknown Unknown Other reaction(s): Other (See Comments) Unknown Unknown Other reaction(s): Other (See Comments), Other (See Comments), Other (See Comments) Unknown Unknown Other reaction(s): Other (See Comments) Unknown Unknown  Other reaction(s): Other (See Comments) Unknown Unknown  Other reaction(s): Other (See Comments), Other (See Comments), Other (See Comments) Unknown Unknown Other reaction(s): Other (See Comments) Unknown Unknown Other reaction(s): Other (See Comments), Other (See Comments), Other (See Comments) Unknown Unknown Other reaction(s): Other (See Comments) Unknown Unknown  Other reaction(s): Other (See Comments) Unknown Unknown  Other reaction(s): Other (See Comments), Other (See Comments), Other (See Comments) Unknown Unknown Other reaction(s): Other (See Comments) Unknown Unknown Other reaction(s): Other (See Comments) Unknown Unknown  Other reaction(s): Other (See Comments), Other (See Comments), Other (See Comments) Unknown Unknown Other reaction(s): Other (See Comments) Unknown Unknown Other reaction(s): Other (See Comments), Other (See Comments), Other (See Comments) Unknown Unknown Other reaction(s): Other (See Comments) Unknown  Unknown  Other reaction(s): Other (See  Comments) Unknown Unknown  Other reaction(s): Other (See Comments), Other (See Comments), Other (See Comments) Unknown Unknown Other reaction(s): Other (See Comments) Unknown Unknown   Lisinopril Cough    Other reaction(s): Cough (ALLERGY/intolerance) Other reaction(s): Cough Other reaction(s): Cough (ALLERGY/intolerance) Other reaction(s): Cough (ALLERGY/intolerance)  Other reaction(s): Cough (ALLERGY/intolerance) Other reaction(s): Cough Other reaction(s): Cough (ALLERGY/intolerance)    Physical Exam: General: The patient is alert and oriented x3 in no acute distress.  Dermatology: Skin is warm, dry and supple bilateral lower extremities. Interspaces are clear of maceration and debris.  There is maceration on the dorsal aspect of the right fifth toe on the exposed nailbed.  There is pain on palpation of this area.  No necrosis or malodor is noted.  Vascular: Palpable pedal pulses bilaterally. Capillary refill within normal limits.  The right fifth toe is edematous with mild rubor and calor present  Neurological: Light touch sensation grossly intact bilateral feet.   Musculoskeletal Exam: Pain on palpation right fifth toe.  No pain with range of motion of the fifth toe at the MPJ.  Radiographic Exam (right foot, 3 weightbearing views, 02/13/2023):  Normal osseous mineralization.  Slight increase in soft tissue density and volume on the right fifth toe distal aspect.  The DIPJ of the fifth toe is fused.  There does appear to be some varus rotation of the distal phalanx.  There is a small C-shaped erosion on the medial aspect of the base of the distal phalanx, but this could be normal curvature, appearing abnormal, due to rotation of the distal phalanx on the radiographic views . Assessment/Plan of Care: 1. Toe injury, right, initial encounter   2. Cellulitis and abscess of toe of right foot      Meds ordered this encounter  Medications   doxycycline (VIBRAMYCIN) 100 MG capsule     Sig: Take 1 capsule (100 mg total) by mouth 2 (two) times daily for 15 days.    Dispense:  30 capsule    Refill:  0   MR TOES RIGHT W WO CONTRAST  Discussed clinical findings with patient today.  Informed the patient that it is somewhat concerning that this injury occurred proximately 6 months ago but the toe remains swollen, painful, erythematous, and also with an area of maceration/drainage where the nail was.  Ordered CRP, ESR, CBC with differential, and serum uric acid to be performed.  Will start her on doxycycline 100 mg twice daily for 2 weeks.  Will order an MRI of the toes of the right foot with and without contrast to evaluate for any type of soft tissue tumor, osteomyelitis of the distal portion of the fifth toe, or any soft tissue changes that might be consistent with malignancy.  Antibiotic ointment and a gauze dressing was applied to the fifth toe.  Patient encouraged to keep the toe covered when in shoe gear especially if the toes been draining.  She can do Epsom salt soaks daily for 15 to 20 minutes in warm water.  Will call the patient to the review the MRI results after performed as well as the blood work results.  Will need to schedule her follow-up appointment at that time as well.  Recommend open toed shoes to keep pressure off the area for now.  Clerance Lav, DPM, FACFAS [facility]     [address]  [place], [place] [address]                Office [phone]  Fax [phone]

## 2023-02-16 ENCOUNTER — Ambulatory Visit: Payer: Medicare PPO | Admitting: Podiatry

## 2023-02-17 ENCOUNTER — Other Ambulatory Visit: Payer: Self-pay | Admitting: Podiatry

## 2023-02-24 ENCOUNTER — Ambulatory Visit (INDEPENDENT_AMBULATORY_CARE_PROVIDER_SITE_OTHER): Payer: Medicare PPO | Admitting: Podiatry

## 2023-02-24 ENCOUNTER — Encounter: Payer: Self-pay | Admitting: Podiatry

## 2023-02-24 DIAGNOSIS — S99921D Unspecified injury of right foot, subsequent encounter: Secondary | ICD-10-CM

## 2023-02-24 DIAGNOSIS — L02611 Cutaneous abscess of right foot: Secondary | ICD-10-CM | POA: Diagnosis not present

## 2023-02-24 DIAGNOSIS — L03031 Cellulitis of right toe: Secondary | ICD-10-CM

## 2023-02-24 MED ORDER — FLUCONAZOLE 150 MG PO TABS: 150.0000 mg | ORAL_TABLET | Freq: Once | ORAL | 0 refills | Status: AC

## 2023-02-24 NOTE — Progress Notes (Signed)
Subjective:  Patient ID: Tammy Fowler, female    DOB: [birth year],   MRN: [id number]  Chief Complaint  Patient presents with   Toe Pain    Pt presents for pain in the  5th on the right.    72 y.o. female presents for follow-up of injury and infection to right fifth toe. Has been in the care of Dr. Carlota Raspberry. Patient relates this started 6 months ago with stubbing the toe. Was seen by Dr. Carlota Raspberry two weeks ago and placed on antibiotics. Labs ordered and still pending and MRI scheduled for 10/16. Patient relates she has been doing ok. She has noticed some drainage but currently no pain. Patient is diabetic and last A1c was 7.5  PCP:  Judge Stall, MD    . Denies any other pedal complaints. Denies n/v/f/c.   Past Medical History:  Diagnosis Date   Cancer (HCC)    Depression    Diabetes mellitus without complication (HCC)    Hyperlipidemia    Hypomagnesemia 08/10/2020   Pulmonary nodules 01/23/2022   Sleep apnea     Objective:  Physical Exam: Vascular: DP/PT pulses 2/4 bilateral. CFT <3 seconds. Normal hair growth on digits. No edema.  Skin. No lacerations or abrasions bilateral feet. Right fifth digit with hyperkeratotic tissue overlying nail bed. No erythema and mild edema not tenderness to palpation currently. Uncderlying hyperkeratosis nail bed is exposed with granular tissue.  Musculoskeletal: MMT 5/5 bilateral lower extremities in DF, PF, Inversion and Eversion. Deceased ROM in DF of ankle joint.  Neurological: Sensation intact to light touch.   Assessment:   1. Toe injury, right, subsequent encounter   2. Cellulitis and abscess of toe of right foot      Plan:  Patient was evaluated and treated and all questions answered. Improved cellulitis of fifth digit  -Dressed with betadine, DSD. -Awaiting labs and MRI.  -Continue with open toe shoes.  -Advised to continue soaks with epsom salt but avoid hydrogen peroxide.  -Continue on doxycycline.  Diflucan provided for concern of  yeast infection after starting antibiotics.  -Discussed glucose control and proper protein-rich diet.  -Discussed if any worsening redness, pain, fever or chills to call or may need to report to the emergency room. Patient expressed understanding.     Return in about 2 weeks (around 03/10/2023) for wound check.   Louann Sjogren, DPM

## 2023-03-04 ENCOUNTER — Ambulatory Visit
Admission: RE | Admit: 2023-03-04 | Discharge: 2023-03-04 | Disposition: A | Discharge status: Home / Self Care | Payer: Medicare PPO | Source: Ambulatory Visit | Attending: Podiatry | Admitting: Podiatry

## 2023-03-04 DIAGNOSIS — L03031 Cellulitis of right toe: Secondary | ICD-10-CM

## 2023-03-04 DIAGNOSIS — S99921A Unspecified injury of right foot, initial encounter: Secondary | ICD-10-CM

## 2023-03-04 MED ORDER — GADOPICLENOL 0.5 MMOL/ML IV SOLN
6.0000 mL | Freq: Once | INTRAVENOUS | Status: AC | PRN
  Administered 2023-03-04: 6 mL via INTRAVENOUS

## 2023-03-05 ENCOUNTER — Telehealth: Payer: Self-pay | Admitting: Podiatry

## 2023-03-05 NOTE — Telephone Encounter (Signed)
Called patient to follow up, because since we are waiting on the MRI to be read, I noticed we never got her bloodwork results.  She had these completed at her PCP office, so she's going to call their office now and ask them to be faxed to our [place] office.  She noted she is getting very worried about the toe because it continues to look worse despite oral antibiotics and antifungals.  I did discuss possibilities of toe cyst, bone cyst, bone tumor, but that the rough images on MRI make it look un-aggressive in nature.  However, still need to wait for their formal read, as I am not an expert radiologist regarding MRI's.

## 2023-03-05 NOTE — Telephone Encounter (Signed)
Pt called asking if she could see Dr Carlota Raspberry sooner that ner appt on 10/23 in [place] office. She had her mri on 10/16. She said her toe looks horrible I explained to pt that the [place] office is not open today or tomorrow and if she just had the mri done yesterday it has been taking 3 to 4 weeks to get the results back. I did offer her appt in the [place] office but she said if the mri results are not back then there is no use to come in. I did ask pt to call next Tuesday the day before the appt to confirm the mri results are in. She is to ask for the nurse when she calls in.

## 2023-03-06 ENCOUNTER — Other Ambulatory Visit: Payer: Self-pay | Admitting: Oncology

## 2023-03-06 ENCOUNTER — Encounter: Payer: Self-pay | Admitting: Podiatry

## 2023-03-06 ENCOUNTER — Telehealth: Payer: Self-pay

## 2023-03-06 DIAGNOSIS — G4700 Insomnia, unspecified: Secondary | ICD-10-CM

## 2023-03-06 MED ORDER — ZOLPIDEM TARTRATE 10 MG PO TABS
10.0000 mg | ORAL_TABLET | Freq: Every evening | ORAL | 0 refills | Status: AC | PRN
Start: 2023-03-06 — End: 2023-04-05

## 2023-03-06 NOTE — Telephone Encounter (Signed)
Patient would like a refill on her Ambien sent to [facility] in [place]. But was also wanting to know if you could increase it 10mg  instead of 5mg .

## 2023-03-11 ENCOUNTER — Ambulatory Visit (INDEPENDENT_AMBULATORY_CARE_PROVIDER_SITE_OTHER): Payer: Medicare PPO | Admitting: Podiatry

## 2023-03-11 ENCOUNTER — Encounter: Payer: Self-pay | Admitting: Podiatry

## 2023-03-11 DIAGNOSIS — M7989 Other specified soft tissue disorders: Secondary | ICD-10-CM | POA: Diagnosis not present

## 2023-03-11 DIAGNOSIS — D492 Neoplasm of unspecified behavior of bone, soft tissue, and skin: Secondary | ICD-10-CM

## 2023-03-15 NOTE — Progress Notes (Signed)
Chief Complaint  Patient presents with   Toe Pain    F/up mass of right 5th toe, patient endorses pain. Concern for infection. DM last A1c 7.5   HPI: 72 y.o. female presents today after having an MRI to the right fifth toe.  We did review the findings via MyChart message and patient was informed we will discuss it further today.  She feels that the toe is getting larger and she is getting very concerned due to her diabetes and concern for infection.  Past Medical History:  Diagnosis Date   Cancer (HCC)    Depression    Diabetes mellitus without complication (HCC)    Hyperlipidemia    Hypomagnesemia 08/10/2020   Pulmonary nodules 01/23/2022   Sleep apnea     Past Surgical History:  Procedure Laterality Date   ABDOMINAL HYSTERECTOMY     BREAST RECONSTRUCTION WITH PLACEMENT OF TISSUE EXPANDER AND FLEX HD (ACELLULAR HYDRATED DERMIS) Bilateral 12/11/2020   Procedure: BILATERAL BREAST RECONSTRUCTION WITH PLACEMENT OF TISSUE EXPANDER AND FLEX HD (ACELLULAR HYDRATED DERMIS);  Surgeon: Allena Napoleon, MD;  Location: [facility];  Service: Plastics;  Laterality: Bilateral;   BREAST SURGERY Left    CATARACT EXTRACTION Left 09/25/2022   HYSTERECTOMY ABDOMINAL WITH SALPINGO-OOPHORECTOMY  07/2003   due to fibroids   NIPPLE SPARING MASTECTOMY Bilateral 12/11/2020   Procedure: BILATERAL NIPPLE SPARING MASTECTOMY;  Surgeon: Emelia Loron, MD;  Location: [facility];  Service: General;  Laterality: Bilateral;   PORTACATH PLACEMENT Right 06/26/2020   Procedure: INSERTION PORT-A-CATH;  Surgeon: Emelia Loron, MD;  Location: [facility];  Service: General;  Laterality: Right;   REMOVAL OF BILATERAL TISSUE EXPANDERS WITH PLACEMENT OF BILATERAL BREAST IMPLANTS Bilateral 04/30/2021   Procedure: REMOVAL OF BILATERAL TISSUE EXPANDERS WITH PLACEMENT OF BILATERAL BREAST IMPLANTS;  Surgeon: Allena Napoleon, MD;  Location: [facility];   Service: Plastics;  Laterality: Bilateral;   TRIGGER FINGER RELEASE      Allergies  Allergen Reactions   Oxycodone Itching    Other reaction(s): Itching   Codeine Other (See Comments)    Other reaction(s): Other (See Comments) Unknown Unknown  Other reaction(s): Other (See Comments), Other (See Comments), Other (See Comments) Unknown Unknown Other reaction(s): Other (See Comments) Unknown Unknown Other reaction(s): Other (See Comments), Other (See Comments), Other (See Comments) Unknown Unknown Other reaction(s): Other (See Comments) Unknown Unknown  Other reaction(s): Other (See Comments) Unknown Unknown  Other reaction(s): Other (See Comments), Other (See Comments), Other (See Comments) Unknown Unknown Other reaction(s): Other (See Comments) Unknown Unknown Other reaction(s): Other (See Comments), Other (See Comments), Other (See Comments) Unknown Unknown Other reaction(s): Other (See Comments) Unknown Unknown  Other reaction(s): Other (See Comments) Unknown Unknown  Other reaction(s): Other (See Comments), Other (See Comments), Other (See Comments) Unknown Unknown Other reaction(s): Other (See Comments) Unknown Unknown Other reaction(s): Other (See Comments) Unknown Unknown  Other reaction(s): Other (See Comments), Other (See Comments), Other (See Comments) Unknown Unknown Other reaction(s): Other (See Comments) Unknown Unknown Other reaction(s): Other (See Comments), Other (See Comments), Other (See Comments) Unknown Unknown Other reaction(s): Other (See Comments) Unknown Unknown  Other reaction(s): Other (See Comments) Unknown Unknown  Other reaction(s): Other (See Comments), Other (See Comments), Other (See Comments) Unknown Unknown Other reaction(s): Other (See Comments) Unknown Unknown   Lisinopril Cough    Other reaction(s): Cough (ALLERGY/intolerance) Other reaction(s): Cough Other reaction(s): Cough  (ALLERGY/intolerance) Other reaction(s): Cough (ALLERGY/intolerance)  Other reaction(s): Cough (ALLERGY/intolerance) Other reaction(s):  Cough Other reaction(s): Cough (ALLERGY/intolerance)     Physical Exam: Palpable pedal pulses right foot.  The right fifth toe is edematous.  This does appear slightly larger than on last visit.  The nailbed is Scientist, physiological.  There is pain on palpation to the right fifth toe.  No malodor is noted.  No ascending erythema is noted.  No significant calor is noted.  MRI right foot (03/04/2023): IMPRESSION: 1. Avidly enhancing soft tissue masses of the fifth toe, one of which is centered in the subungual region and adjacent mass extends into the medial soft tissues of the fifth toe. There is bony erosion of the underlying distal phalanx. Findings are concerning for neoplasm with differential considerations including glomus tumor and synovial sarcoma. Gouty tophus could also have this appearance. Tissue sampling is recommended for definitive diagnosis. Consider orthopedic oncology consultation. 2. Bone marrow edema within the distal phalanx is favored reactive. Appearance not suggestive of osteomyelitis. 3. 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     Electronically Signed   By: Duanne Guess D.O.   On: 03/05/2023 16:28  Assessment/Plan of Care: 1. Mass of soft tissue of foot   2. Neoplasm of bone of right foot     Discussed clinical and MRI findings with patient today.  Agree with the MRI reading radiologist recommendation of obtaining soft tissue and bone samples for identification via pathology to determine her formal diagnosis and treatment plan.  I discussed this with the patient.  This would be under intravenous sedation with local anesthesia to block the right fifth toe.  We would make a dorsal incision and obtain soft tissue swabs and tissue samples to send to pathology as well as bone biopsy.  She  would have stitches on the dorsal aspect the toe for closure and be placed in a surgical shoe for approximately 3 weeks.  As she would like to go ahead and proceed with this as soon as possible.  Due to upcoming surgery schedule will try to see if we can get her added on after the case in 2 days if our surgery coordinator is able to get prior authorization this quickly from her insurance company.  Once we have a formal diagnosis we can adequately proceed with formal treatment plan.  Reviewed benefits risks and possible postoperative complications from undergoing surgery.  Also discussed issue of this worsening if she does not proceed with any surgical intervention.  Hemoglobin A1c of under 8% is necessary for adequate healing and prevention of postoperative complications.  As she is not currently taking any blood thinners so we do not have to have her discontinue any anticoagulant medication prior to surgery.  Will schedule this at the [facility] as soon as possible.  Patient was reviewed and signed surgical consent form today.  She was given our surgical preparation handbook and bag to prepare for surgery.  Clerance Lav, DPM, FACFAS [facility]     [address] [place], [place] [address]                Office [phone]  Fax [phone]

## 2023-03-17 ENCOUNTER — Telehealth: Payer: Self-pay | Admitting: Podiatry

## 2023-03-17 NOTE — Telephone Encounter (Signed)
DOS 03/20/2023  BONE BIOPSY RT [id number] SOFT TISSUE MASS RT [id number]  [employer] EFFECTIVE DATE- 05/19/2022  OOP- $8850.00 WITH REMAINING $6,956.02  COINSURANCE- 0%  PER THE [place] WEBSITE PORTAL, PRIOR AUTH IS NOT REQUIRED FOR CPT CODES 62130 AND [phone]. GOOD FROM 03/20/2023 - 06/18/2023.  AUTH Tracking [id number]

## 2023-03-18 ENCOUNTER — Other Ambulatory Visit: Payer: Medicare PPO

## 2023-03-19 ENCOUNTER — Other Ambulatory Visit: Payer: Self-pay | Admitting: Podiatry

## 2023-03-19 MED ORDER — AMOXICILLIN 500 MG PO CAPS: 500.0000 mg | ORAL_CAPSULE | Freq: Two times a day (BID) | ORAL | 0 refills | Status: AC

## 2023-03-20 ENCOUNTER — Other Ambulatory Visit: Payer: Self-pay | Admitting: Podiatry

## 2023-03-20 DIAGNOSIS — D492 Neoplasm of unspecified behavior of bone, soft tissue, and skin: Secondary | ICD-10-CM

## 2023-03-20 DIAGNOSIS — M7989 Other specified soft tissue disorders: Secondary | ICD-10-CM

## 2023-03-20 MED ORDER — TRAMADOL HCL 50 MG PO TABS
50.0000 mg | ORAL_TABLET | Freq: Two times a day (BID) | ORAL | 0 refills | Status: AC
Start: 2023-03-20 — End: 2023-03-27

## 2023-03-26 ENCOUNTER — Encounter: Payer: Medicare PPO | Admitting: Podiatry

## 2023-04-02 ENCOUNTER — Encounter: Payer: Medicare PPO | Admitting: Podiatry

## 2023-04-02 ENCOUNTER — Ambulatory Visit: Payer: Medicare PPO | Admitting: Podiatry

## 2023-04-02 DIAGNOSIS — M869 Osteomyelitis, unspecified: Secondary | ICD-10-CM

## 2023-04-02 MED ORDER — SULFAMETHOXAZOLE-TRIMETHOPRIM 800-160 MG PO TABS
1.0000 | ORAL_TABLET | Freq: Two times a day (BID) | ORAL | 0 refills | Status: DC
Start: 2023-04-02 — End: 2023-04-20

## 2023-04-02 NOTE — Progress Notes (Signed)
Chief Complaint  Patient presents with   Routine Post Op    Diabetic, Denies pain, N/V/F/C/SOB, completed abx this morning   HPI: 72 y.o. female presents today for first postop appointment after having soft tissue and bone and skin biopsy from the right fifth toe.  The soft tissue and bone culture reports were received this morning.  Will plan to review with patient.  Patient notes minimal discomfort at this time.  She has been able to keep the dressing clean and dry.  She is concerned as to the findings of the report.  Past Medical History:  Diagnosis Date   Cancer (HCC)    Depression    Diabetes mellitus without complication (HCC)    Hyperlipidemia    Hypomagnesemia 08/10/2020   Pulmonary nodules 01/23/2022   Sleep apnea     Past Surgical History:  Procedure Laterality Date   ABDOMINAL HYSTERECTOMY     BREAST RECONSTRUCTION WITH PLACEMENT OF TISSUE EXPANDER AND FLEX HD (ACELLULAR HYDRATED DERMIS) Bilateral 12/11/2020   Procedure: BILATERAL BREAST RECONSTRUCTION WITH PLACEMENT OF TISSUE EXPANDER AND FLEX HD (ACELLULAR HYDRATED DERMIS);  Surgeon: Allena Napoleon, MD;  Location: [facility];  Service: Plastics;  Laterality: Bilateral;   BREAST SURGERY Left    CATARACT EXTRACTION Left 09/25/2022   HYSTERECTOMY ABDOMINAL WITH SALPINGO-OOPHORECTOMY  07/2003   due to fibroids   NIPPLE SPARING MASTECTOMY Bilateral 12/11/2020   Procedure: BILATERAL NIPPLE SPARING MASTECTOMY;  Surgeon: Emelia Loron, MD;  Location: [facility];  Service: General;  Laterality: Bilateral;   PORTACATH PLACEMENT Right 06/26/2020   Procedure: INSERTION PORT-A-CATH;  Surgeon: Emelia Loron, MD;  Location: [facility];  Service: General;  Laterality: Right;   REMOVAL OF BILATERAL TISSUE EXPANDERS WITH PLACEMENT OF BILATERAL BREAST IMPLANTS Bilateral 04/30/2021   Procedure: REMOVAL OF BILATERAL TISSUE EXPANDERS WITH PLACEMENT OF BILATERAL BREAST IMPLANTS;   Surgeon: Allena Napoleon, MD;  Location: [facility];  Service: Plastics;  Laterality: Bilateral;   TRIGGER FINGER RELEASE      Allergies  Allergen Reactions   Oxycodone Itching    Other reaction(s): Itching   Codeine Other (See Comments)    Other reaction(s): Other (See Comments) Unknown Unknown  Other reaction(s): Other (See Comments), Other (See Comments), Other (See Comments) Unknown Unknown Other reaction(s): Other (See Comments) Unknown Unknown Other reaction(s): Other (See Comments), Other (See Comments), Other (See Comments) Unknown Unknown Other reaction(s): Other (See Comments) Unknown Unknown  Other reaction(s): Other (See Comments) Unknown Unknown  Other reaction(s): Other (See Comments), Other (See Comments), Other (See Comments) Unknown Unknown Other reaction(s): Other (See Comments) Unknown Unknown Other reaction(s): Other (See Comments), Other (See Comments), Other (See Comments) Unknown Unknown Other reaction(s): Other (See Comments) Unknown Unknown  Other reaction(s): Other (See Comments) Unknown Unknown  Other reaction(s): Other (See Comments), Other (See Comments), Other (See Comments) Unknown Unknown Other reaction(s): Other (See Comments) Unknown Unknown Other reaction(s): Other (See Comments) Unknown Unknown  Other reaction(s): Other (See Comments), Other (See Comments), Other (See Comments) Unknown Unknown Other reaction(s): Other (See Comments) Unknown Unknown Other reaction(s): Other (See Comments), Other (See Comments), Other (See Comments) Unknown Unknown Other reaction(s): Other (See Comments) Unknown Unknown  Other reaction(s): Other (See Comments) Unknown Unknown  Other reaction(s): Other (See Comments), Other (See Comments), Other (See Comments) Unknown Unknown Other reaction(s): Other (See Comments) Unknown Unknown   Lisinopril Cough    Other reaction(s): Cough (ALLERGY/intolerance) Other  reaction(s): Cough Other reaction(s): Cough (ALLERGY/intolerance) Other reaction(s): Cough (ALLERGY/intolerance)  Other reaction(s): Cough (ALLERGY/intolerance) Other reaction(s): Cough Other reaction(s): Cough (ALLERGY/intolerance)    Physical Exam: Palpable pedal pulses noted.  Minimal localized edema to the right fifth toe with ecchymosis noted.  Incision is well-approximated.  Sutures are intact.  No clinical signs of infection are noted with regard to the surgical area (no calor, no rubor , and no purulence or dehiscence).  The right fifth toe intra-op soft tissue culture revealed heavy growth of Enterobacter cloaca complex with moderate growth of group B strep isolated.  There is susceptibility to Bactrim imipenem ciprofloxacin cefepime and Zosyn and gentamicin.  The right fifth toe intra-op bone culture revealed light growth of group B strep and light growth of Enterobacter cloaca with the same susceptibility as above.  Assessment/Plan of Care: 1. 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    Dispense:  40 tablet    Refill:  0   AMB REFERRAL TO INFECTIOUS DISEASE  Discussed clinical findings with patient today.  Informed the patient that this was not all inclusive as we have not received the pathology report from the skin specimen or the soft tissue and bone specimens for pathology.  The patient still noted her desire to avoid toe amputation, so will refer to infectious disease to see which nonsurgical options are best for her.  Discussed possibility of 6 to 8 weeks of oral antibiotics versus 6 to 8 weeks of PICC line antibiotics versus final possibility of toe amputation.  She will discuss these with infectious disease and proceed appropriately.  Prescription for Bactrim double strength 1 tablet p.o. twice  daily written for 20-day course until she can get seen by infectious disease.  Referral placed today.  The surgical area was redressed with Adaptic and dry sterile fluff dressing was applied.  She will keep this intact until her next postoperative appointment for planned suture removal.   Korinna Tat D. Emi Lymon, DPM, FACFAS [facility]     [address] [place], [place] [address]                Office [phone]  Fax [phone]

## 2023-04-06 ENCOUNTER — Telehealth: Payer: Self-pay | Admitting: Podiatry

## 2023-04-06 NOTE — Telephone Encounter (Signed)
Patient called and said that her toe condition seems to be getting worse. She said her toe is turning black and the next appointment she could get with infectious disease is not soon enough. She was crying and very upset. I transferred her over to nurse triage. Her husband called back bout 15 minutes later and said no one answered the phone, and she was told to leave a message. I then messaged Dr. Burna Mortimer and she asked me to call infectious disease to see if the patient can be seen sooner if not tell the patient to go to the ER. Infectious disease said they don't have anything until her appointment which is 04/20/23. I called Tammy Fowler back and informed her.

## 2023-04-07 ENCOUNTER — Encounter: Payer: Self-pay | Admitting: Hematology and Oncology

## 2023-04-07 ENCOUNTER — Telehealth: Payer: Self-pay

## 2023-04-07 ENCOUNTER — Inpatient Hospital Stay: Payer: Medicare PPO | Admitting: Oncology

## 2023-04-07 ENCOUNTER — Inpatient Hospital Stay (HOSPITAL_BASED_OUTPATIENT_CLINIC_OR_DEPARTMENT_OTHER): Payer: Medicare PPO | Admitting: Hematology and Oncology

## 2023-04-07 ENCOUNTER — Inpatient Hospital Stay: Payer: Medicare PPO | Attending: Hematology and Oncology

## 2023-04-07 VITALS — BP 131/65 | HR 76 | Temp 98.1°F | Resp 20 | Ht 62.0 in | Wt 142.3 lb

## 2023-04-07 DIAGNOSIS — Z1501 Genetic susceptibility to malignant neoplasm of breast: Secondary | ICD-10-CM

## 2023-04-07 DIAGNOSIS — Z79899 Other long term (current) drug therapy: Secondary | ICD-10-CM | POA: Diagnosis not present

## 2023-04-07 DIAGNOSIS — Z9013 Acquired absence of bilateral breasts and nipples: Secondary | ICD-10-CM | POA: Diagnosis not present

## 2023-04-07 DIAGNOSIS — D649 Anemia, unspecified: Secondary | ICD-10-CM | POA: Diagnosis not present

## 2023-04-07 DIAGNOSIS — Z1509 Genetic susceptibility to other malignant neoplasm: Secondary | ICD-10-CM

## 2023-04-07 DIAGNOSIS — Z170 Estrogen receptor positive status [ER+]: Secondary | ICD-10-CM | POA: Diagnosis not present

## 2023-04-07 DIAGNOSIS — C50512 Malignant neoplasm of lower-outer quadrant of left female breast: Secondary | ICD-10-CM

## 2023-04-07 DIAGNOSIS — R918 Other nonspecific abnormal finding of lung field: Secondary | ICD-10-CM

## 2023-04-07 DIAGNOSIS — Z79811 Long term (current) use of aromatase inhibitors: Secondary | ICD-10-CM | POA: Diagnosis not present

## 2023-04-07 DIAGNOSIS — C50912 Malignant neoplasm of unspecified site of left female breast: Secondary | ICD-10-CM

## 2023-04-07 HISTORY — DX: Genetic susceptibility to other malignant neoplasm: Z15.09

## 2023-04-07 LAB — CBC WITH DIFFERENTIAL (CANCER CENTER ONLY)
Abs Immature Granulocytes: 0.02 10*3/uL (ref 0.00–0.07)
Basophils Absolute: 0.1 10*3/uL (ref 0.0–0.1)
Basophils Relative: 1 %
Eosinophils Absolute: 0.1 10*3/uL (ref 0.0–0.5)
Eosinophils Relative: 1 %
HCT: 35.3 % — ABNORMAL LOW (ref 36.0–46.0)
Hemoglobin: 11.8 g/dL — ABNORMAL LOW (ref 12.0–15.0)
Immature Granulocytes: 0 %
Lymphocytes Relative: 27 %
Lymphs Abs: 1.5 10*3/uL (ref 0.7–4.0)
MCH: 32.6 pg (ref 26.0–34.0)
MCHC: 33.4 g/dL (ref 30.0–36.0)
MCV: 97.5 fL (ref 80.0–100.0)
Monocytes Absolute: 0.5 10*3/uL (ref 0.1–1.0)
Monocytes Relative: 9 %
Neutro Abs: 3.3 10*3/uL (ref 1.7–7.7)
Neutrophils Relative %: 62 %
Platelet Count: 308 10*3/uL (ref 150–400)
RBC: 3.62 MIL/uL — ABNORMAL LOW (ref 3.87–5.11)
RDW: 12.7 % (ref 11.5–15.5)
WBC Count: 5.4 10*3/uL (ref 4.0–10.5)
nRBC: 0 % (ref 0.0–0.2)
nRBC: 0 /100{WBCs}

## 2023-04-07 LAB — CMP (CANCER CENTER ONLY)
ALT: 16 U/L (ref 0–44)
AST: 20 U/L (ref 15–41)
Albumin: 4.8 g/dL (ref 3.5–5.0)
Alkaline Phosphatase: 72 U/L (ref 38–126)
Anion gap: 13 (ref 5–15)
BUN: 23 mg/dL (ref 8–23)
CO2: 22 mmol/L (ref 22–32)
Calcium: 10.5 mg/dL — ABNORMAL HIGH (ref 8.9–10.3)
Chloride: 102 mmol/L (ref 98–111)
Creatinine: 1.03 mg/dL — ABNORMAL HIGH (ref 0.44–1.00)
GFR, Estimated: 58 mL/min — ABNORMAL LOW (ref >60)
Glucose, Bld: 91 mg/dL (ref 70–99)
Potassium: 4.7 mmol/L (ref 3.5–5.1)
Sodium: 137 mmol/L (ref 135–145)
Total Bilirubin: 0.4 mg/dL (ref <1.2)
Total Protein: 7.2 g/dL (ref 6.5–8.1)

## 2023-04-07 NOTE — Telephone Encounter (Signed)
Message left with medical records at [facility].

## 2023-04-07 NOTE — Assessment & Plan Note (Signed)
2 small pulmonary nodules, originally noted on PET in June 2023. These were stable on CT chest in May and not considered suspicious.

## 2023-04-07 NOTE — Progress Notes (Signed)
[facility]  [address] [place],  [place]  [address] [phone]  Clinic Day:  04/07/2023  Referring physician: Judge Stall, MD  ASSESSMENT & PLAN:   Assessment & Plan: Local recurrence of cancer of left breast [facility]) In December 2021, she presented with a palpable nodule in the upper inner quadrant of the left breast.  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  Estrogen receptors positive, progesterone receptor negative and HER2 positive. This was felt to most likely represent a recurrence rather than a 2nd breast primary.  She was treated with 6 cycles of neoadjuvant TCHP in July.  After 4 cycles, the doses were reduced by 25%, but she still was experiencing adverse effects. She underwent bilateral mastectomy with immediate reconstruction in August 2022.  She received three cycles of HER2 targeted therapy prior to surgery. She received 10 of 11 planned doses of trastuzumab/pertuzumab and then was admitted with acute kidney injury felt to be due to the HER2 targeted therapy. She also had significant elevation of the transaminases.  She was found to have rhabdomyolysis.  She required temporary dialysis.  Multiple medications were discontinued including non-steroidal antiinflammatory agents, her diabetes medication and statin.  She has had persistent anemia, but otherwise recovered well.  Due to estrogen receptor positive disease, we recommended placing her on an aromatase inhibitor.  Her 1st cancer was treated with anastrozole, so she is being treated with letrozole 2.5 mg daily. She is tolerating this well. Bone density scan in November 2023 remained normal. We will plan to see her back in 6 months for repeat clinical assessment. She will be due for bone density scan again in November 2025.  Pulmonary nodules 2 small pulmonary nodules, originally noted on PET in June 2023. These were  stable on CT chest in May and not considered suspicious.  Anemia Chronic mild anemia, which is stable. Previous evaluation did not reveal any nutritional deficiency contributing to her anemia.  We will continue to monitor this.  ATM gene mutation positive She has had bilateral mastectomies.  She is also at increased risk for pancreatic cancer.    The patient understands the plans discussed today and is in agreement with them.  She knows to contact our office if she develops concerns prior to her next appointment.   I provided 40 minutes of face-to-face time during this encounter and > 50% was spent counseling as documented under my assessment and plan.    Adah Perl, PA-C  [facility]  [facility] - A DEPT OF [facility] [address] [place] [address] Dept: [phone] Dept Fax: [phone]   Orders Placed This Encounter  Procedures   CBC with Differential (Cancer Center Only)    Standing Status:   Future    Standing Expiration Date:   04/06/2024   CMP (Cancer Center only)    Standing Status:   Future    Standing Expiration Date:   04/06/2024      CHIEF COMPLAINT:  CC: Recurrent hormone receptor positive breast cancer  Current Treatment:  Letrozole 2.5 mg daily  HISTORY OF PRESENT ILLNESS:   Oncology History  Malignant neoplasm of lower-outer quadrant of left breast of female, estrogen receptor positive (HCC)  11/03/2002 Initial Diagnosis   Breast cancer in female [facility])   11/03/2002 Cancer Staging   Staging form: Breast, AJCC 6th Edition - Clinical stage from 11/03/2002: Stage IIIA (  T2, N2, M0) - Signed by Dellia Beckwith, MD on 06/04/2020 Staging comments: Treated with AC x4, Taxol x4, anastrazole x 10 years, completed April 2015 Prognostic indicators: ER/PR pos, HER2 neg, age 36 at dx, had re-excision for pos margin   Local recurrence of cancer of left breast (HCC)  06/08/2020 Initial Diagnosis   Local  recurrence of cancer of right breast (HCC)   06/20/2020 Cancer Staging   Staging form: Breast, AJCC 8th Edition - Clinical stage from 06/20/2020: Stage IIA (rcT2, cN0, cM0, G3, ER+, PR-, HER2+) - Signed by Dellia Beckwith, MD on 06/27/2020 Histopathologic type: Carcinoma, NOS Stage prefix: Recurrence Method of lymph node assessment: Clinical Nuclear grade: G3 Multigene prognostic tests performed: None Menopausal status: Postmenopausal Ki-67 (%): 25 Stage used in treatment planning: Yes National guidelines used in treatment planning: Yes Type of national guideline used in treatment planning: NCCN Staging comments: 3.2 total size, incl satellite lesion.  Arising in adipose tissue so susp for recurrence but now HER @ positive   07/03/2020 - 10/22/2020 Chemotherapy    Patient is on Treatment Plan: BREAST TRASTUZUMAB + PERTUZUMAB Q21D X 11 CYCLES      11/08/2020 - 10/15/2021 Chemotherapy   Patient is on Treatment Plan : BREAST Trastuzumab + Pertuzumab q21d x 11 cycles     12/13/2020 Cancer Staging   Staging form: Breast, AJCC 8th Edition - Pathologic stage from 12/13/2020: No Stage Recommended (ypT1c, pN0, cM0, G2, ER+, PR-, HER2+) - Signed by Dellia Beckwith, MD on 01/10/2021 Histopathologic type: Carcinoma, NOS Stage prefix: Post-therapy Response to neoadjuvant therapy: Partial response Method of lymph node assessment: Clinical Nuclear grade: G2 Multigene prognostic tests performed: None Histologic grading system: 3 grade system Residual tumor (R): R0 - None Laterality: Left Tumor size (mm): 15 Lymph-vascular invasion (LVI): LVI not present (absent)/not identified Diagnostic confirmation: Positive histology Specimen type: Excision Staged by: Managing physician Menopausal status: Postmenopausal Ki-67 (%): 25 Stage used in treatment planning: Yes National guidelines used in treatment planning: Yes Type of national guideline used in treatment planning: NCCN   Hypomagnesemia       INTERVAL HISTORY:  Tammy Fowler is here today for repeat clinical assessment. Since her last visit, she injured her right great toe and has had trouble with healing. She has been following with podiatry. She had a biopsy recently after MRI was concerning for possible bone lesion. She states cultures revealed multiple organisms and she is seeing infectious disease next week. I will request a copy of the report.  She denies fevers or chills. She denies pain. Her appetite is good. Her weight has increased 6 pounds over last 6 months . She states she is caring for her elderly mother who "is doing better than her".  REVIEW OF SYSTEMS:  Review of Systems  Constitutional:  Negative for appetite change, chills, fatigue, fever and unexpected weight change.  HENT:   Negative for lump/mass, mouth sores, sore throat and trouble swallowing.   Respiratory:  Negative for cough and shortness of breath.   Cardiovascular:  Negative for chest pain and leg swelling.  Gastrointestinal:  Negative for abdominal pain, constipation, diarrhea, nausea and vomiting.  Endocrine: Negative for hot flashes.  Genitourinary:  Negative for difficulty urinating, dysuria, frequency, hematuria, vaginal bleeding and vaginal discharge.   Musculoskeletal:  Positive for gait problem (due to right toe surgery). Negative for arthralgias, back pain and myalgias.  Skin:  Positive for wound. Negative for itching and rash.  Neurological:  Positive for gait problem (due to right  toe surgery), headaches (chronic, no change in frequency or severity) and numbness (right great toe). Negative for dizziness, extremity weakness, light-headedness and speech difficulty.  Hematological:  Negative for adenopathy. Does not bruise/bleed easily.  Psychiatric/Behavioral:  Negative for depression and sleep disturbance. The patient is not nervous/anxious.      VITALS:  Blood pressure 131/65, pulse 76, temperature 98.1 F (36.7 C), temperature source Oral, resp.  rate 20, height 5\' 2"  (1.575 m), weight 142 lb 4.8 oz (64.5 kg), SpO2 99%.  Wt Readings from Last 3 Encounters:  04/07/23 142 lb 4.8 oz (64.5 kg)  10/02/22 136 lb 11.2 oz (62 kg)  06/11/22 146 lb 1.6 oz (66.3 kg)    Body mass index is 26.03 kg/m.  Performance status (ECOG): 2 - Symptomatic, <50% confined to bed  PHYSICAL EXAM:  Physical Exam Vitals and nursing note reviewed.  Constitutional:      General: She is not in acute distress.    Appearance: Normal appearance.  HENT:     Head: Normocephalic and atraumatic.     Mouth/Throat:     Mouth: Mucous membranes are moist.     Pharynx: Oropharynx is clear. No oropharyngeal exudate or posterior oropharyngeal erythema.  Eyes:     General: No scleral icterus.    Extraocular Movements: Extraocular movements intact.     Conjunctiva/sclera: Conjunctivae normal.     Pupils: Pupils are equal, round, and reactive to light.  Cardiovascular:     Rate and Rhythm: Normal rate and regular rhythm.     Heart sounds: Normal heart sounds. No murmur heard.    No friction rub. No gallop.  Pulmonary:     Effort: Pulmonary effort is normal.     Breath sounds: Normal breath sounds. No wheezing, rhonchi or rales.  Chest:  Breasts:    Right: Absent.     Left: Absent.     Comments: Bilateral reconstructions are negative Abdominal:     General: There is no distension.     Palpations: Abdomen is soft. There is no hepatomegaly, splenomegaly or mass.     Tenderness: There is no abdominal tenderness.  Musculoskeletal:        General: Normal range of motion.     Cervical back: Normal range of motion and neck supple. No tenderness.     Right lower leg: No edema.     Left lower leg: No edema.  Lymphadenopathy:     Cervical: No cervical adenopathy.     Upper Body:     Right upper body: No supraclavicular or axillary adenopathy.     Left upper body: No supraclavicular or axillary adenopathy.     Lower Body: No right inguinal adenopathy. No left  inguinal adenopathy.  Skin:    General: Skin is warm and dry.     Coloration: Skin is not jaundiced.     Findings: No rash.  Neurological:     Mental Status: She is alert and oriented to person, place, and time.     Cranial Nerves: No cranial nerve deficit.  Psychiatric:        Mood and Affect: Mood normal.        Behavior: Behavior normal.        Thought Content: Thought content normal.    LABS:      Latest Ref Rng & Units 04/07/2023    1:59 PM 09/24/2022   12:00 AM 06/11/2022   12:00 AM  CBC  WBC 4.0 - 10.5 K/uL 5.4  5.0  7.3      Hemoglobin 12.0 - 15.0 g/dL 16.1  09.6     04.5      Hematocrit 36.0 - 46.0 % 35.3  36     36      Platelets 150 - 400 K/uL 308  254     313         This result is from an external source.      Latest Ref Rng & Units 04/07/2023    1:59 PM 09/24/2022   12:00 AM 06/11/2022   12:00 AM  CMP  Glucose 70 - 99 mg/dL 91     BUN 8 - 23 mg/dL 23  22     22       Creatinine 0.44 - 1.00 mg/dL 4.09  0.6     0.8      Sodium 135 - 145 mmol/L 137  138     140      Potassium 3.5 - 5.1 mmol/L 4.7  3.9     3.9      Chloride 98 - 111 mmol/L 102  108     104      CO2 22 - 32 mmol/L 22  26     28       Calcium 8.9 - 10.3 mg/dL 81.1  9.2     91.4      Total Protein 6.5 - 8.1 g/dL 7.2     Total Bilirubin <1.2 mg/dL 0.4     Alkaline Phos 38 - 126 U/L 72  74     65      AST 15 - 41 U/L 20  24     28       ALT 0 - 44 U/L 16  21     20          This result is from an external source.     No results found for: "CEA1", "CEA" / No results found for: "CEA1", "CEA" No results found for: "PSA1" No results found for: "NWG956" No results found for: "CAN125"  No results found for: "TOTALPROTELP", "ALBUMINELP", "A1GS", "A2GS", "BETS", "BETA2SER", "GAMS", "MSPIKE", "SPEI" No results found for: "TIBC", "FERRITIN", "IRONPCTSAT" No results found for: "LDH"  STUDIES:  No results found.    HISTORY:   Past Medical History:  Diagnosis Date   ATM gene mutation positive  04/07/2023   Cancer (HCC)    Depression    Diabetes mellitus without complication (HCC)    Hyperlipidemia    Hypomagnesemia 08/10/2020   Pulmonary nodules 01/23/2022   Sleep apnea     Past Surgical History:  Procedure Laterality Date   ABDOMINAL HYSTERECTOMY     BREAST RECONSTRUCTION WITH PLACEMENT OF TISSUE EXPANDER AND FLEX HD (ACELLULAR HYDRATED DERMIS) Bilateral 12/11/2020   Procedure: BILATERAL BREAST RECONSTRUCTION WITH PLACEMENT OF TISSUE EXPANDER AND FLEX HD (ACELLULAR HYDRATED DERMIS);  Surgeon: Allena Napoleon, MD;  Location: [facility];  Service: Plastics;  Laterality: Bilateral;   BREAST SURGERY Left    CATARACT EXTRACTION Left 09/25/2022   HYSTERECTOMY ABDOMINAL WITH SALPINGO-OOPHORECTOMY  07/2003   due to fibroids   NIPPLE SPARING MASTECTOMY Bilateral 12/11/2020   Procedure: BILATERAL NIPPLE SPARING MASTECTOMY;  Surgeon: Emelia Loron, MD;  Location: [facility];  Service: General;  Laterality: Bilateral;   PORTACATH PLACEMENT Right 06/26/2020   Procedure: INSERTION PORT-A-CATH;  Surgeon: Emelia Loron, MD;  Location: [facility];  Service: General;  Laterality: Right;   REMOVAL OF BILATERAL TISSUE EXPANDERS WITH PLACEMENT  OF BILATERAL BREAST IMPLANTS Bilateral 04/30/2021   Procedure: REMOVAL OF BILATERAL TISSUE EXPANDERS WITH PLACEMENT OF BILATERAL BREAST IMPLANTS;  Surgeon: Allena Napoleon, MD;  Location: [facility];  Service: Plastics;  Laterality: Bilateral;   TRIGGER FINGER RELEASE      Family History  Problem Relation Age of Onset   Breast cancer Mother 69   Breast cancer Maternal Aunt 25   Ovarian cancer Maternal Grandmother 66   Bone cancer Maternal Grandfather        61s   Cancer Maternal Aunt 29    Social History:  reports that she has never smoked. She has never used smokeless tobacco. She reports current alcohol use. She reports that she does not use drugs.The patient is alone  today.  Allergies:  Allergies  Allergen Reactions   Oxycodone Itching    Other reaction(s): Itching   Codeine Other (See Comments)    Other reaction(s): Other (See Comments) Unknown Unknown  Other reaction(s): Other (See Comments), Other (See Comments), Other (See Comments) Unknown Unknown Other reaction(s): Other (See Comments) Unknown Unknown Other reaction(s): Other (See Comments), Other (See Comments), Other (See Comments) Unknown Unknown Other reaction(s): Other (See Comments) Unknown Unknown  Other reaction(s): Other (See Comments) Unknown Unknown  Other reaction(s): Other (See Comments), Other (See Comments), Other (See Comments) Unknown Unknown Other reaction(s): Other (See Comments) Unknown Unknown Other reaction(s): Other (See Comments), Other (See Comments), Other (See Comments) Unknown Unknown Other reaction(s): Other (See Comments) Unknown Unknown  Other reaction(s): Other (See Comments) Unknown Unknown  Other reaction(s): Other (See Comments), Other (See Comments), Other (See Comments) Unknown Unknown Other reaction(s): Other (See Comments) Unknown Unknown Other reaction(s): Other (See Comments) Unknown Unknown  Other reaction(s): Other (See Comments), Other (See Comments), Other (See Comments) Unknown Unknown Other reaction(s): Other (See Comments) Unknown Unknown Other reaction(s): Other (See Comments), Other (See Comments), Other (See Comments) Unknown Unknown Other reaction(s): Other (See Comments) Unknown Unknown  Other reaction(s): Other (See Comments) Unknown Unknown  Other reaction(s): Other (See Comments), Other (See Comments), Other (See Comments) Unknown Unknown Other reaction(s): Other (See Comments) Unknown Unknown   Lisinopril Cough    Other reaction(s): Cough (ALLERGY/intolerance) Other reaction(s): Cough Other reaction(s): Cough (ALLERGY/intolerance) Other reaction(s): Cough (ALLERGY/intolerance)  Other  reaction(s): Cough (ALLERGY/intolerance) Other reaction(s): Cough Other reaction(s): Cough (ALLERGY/intolerance)    Current Medications: Current Outpatient Medications  Medication Sig Dispense Refill   DULoxetine (CYMBALTA) 30 MG capsule Take 30 mg by mouth daily.     JARDIANCE 25 MG TABS tablet Take 25 mg by mouth daily.     pravastatin (PRAVACHOL) 20 MG tablet Take 20 mg by mouth daily.     acetaminophen (TYLENOL) 650 MG CR tablet Take 650 mg by mouth every 8 (eight) hours as needed for pain.     aspirin 81 MG chewable tablet Chew by mouth.     CALCIUM CITRATE-VITAMIN D PO Take 2 tablets by mouth daily at 12 noon. 500mg  of calcium with vitamin D     Docusate Sodium (DSS) 100 MG CAPS Take by mouth. (Patient not taking: Reported on 04/07/2023)     gabapentin (NEURONTIN) 300 MG capsule Take 1 capsule (300 mg total) by mouth 3 (three) times daily. 2 tablets in the morning and 1 tablet at night (Patient taking differently: Take 300 mg by mouth 3 (three) times daily. 1tablet in AM, 2 tabs at night) 90 capsule 3   letrozole (FEMARA) 2.5 MG tablet TAKE 1 TABLET BY MOUTH EVERY DAY 90 tablet 3  losartan (COZAAR) 50 MG tablet daily.     magnesium oxide (MAG-OX) 400 (241.3 Mg) MG tablet Take 1 tablet (400 mg total) by mouth 2 (two) times daily. 60 tablet 1   rOPINIRole (REQUIP) 0.5 MG tablet Take by mouth.     sulfamethoxazole-trimethoprim (BACTRIM DS) 800-160 MG tablet Take 1 tablet by mouth 2 (two) times daily for 20 days. 40 tablet 0   zolpidem (AMBIEN) 10 MG tablet Take 1 tablet (10 mg total) by mouth at bedtime as needed for sleep. 30 tablet 0   No current facility-administered medications for this visit.

## 2023-04-07 NOTE — Assessment & Plan Note (Signed)
Chronic mild anemia, which is stable. Previous evaluation did not reveal any nutritional deficiency contributing to her anemia.  We will continue to monitor this.

## 2023-04-07 NOTE — Telephone Encounter (Signed)
-----   Message from Adah Perl sent at 04/07/2023  2:55 PM EST ----- Please call [facility] in [place] for pathology from toe surgery. Thanks

## 2023-04-07 NOTE — Assessment & Plan Note (Signed)
She has had bilateral mastectomies.  She is also at increased risk for pancreatic cancer.

## 2023-04-07 NOTE — Assessment & Plan Note (Addendum)
In December 2021, she presented with a palpable nodule in the upper inner quadrant of the left breast.  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  Estrogen receptors positive, progesterone receptor negative and HER2 positive. This was felt to most likely represent a recurrence rather than a 2nd breast primary.  She was treated with 6 cycles of neoadjuvant TCHP in July.  After 4 cycles, the doses were reduced by 25%, but she still was experiencing adverse effects. She underwent bilateral mastectomy with immediate reconstruction in August 2022.  She received three cycles of HER2 targeted therapy prior to surgery. She received 10 of 11 planned doses of trastuzumab/pertuzumab and then was admitted with acute kidney injury felt to be due to the HER2 targeted therapy. She also had significant elevation of the transaminases.  She was found to have rhabdomyolysis.  She required temporary dialysis.  Multiple medications were discontinued including non-steroidal antiinflammatory agents, her diabetes medication and statin.  She has had persistent anemia, but otherwise recovered well.  Due to estrogen receptor positive disease, we recommended placing her on an aromatase inhibitor.  Her 1st cancer was treated with anastrozole, so she is being treated with letrozole 2.5 mg daily. She is tolerating this well. Bone density scan in November 2023 remained normal. We will plan to see her back in 6 months for repeat clinical assessment. She will be due for bone density scan again in November 2025.

## 2023-04-07 NOTE — Telephone Encounter (Signed)
The pathologist called from oncology, Dr. Kenard Gower. [phone]  They finally have a diagnosis for the soft tissue/ toe.  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 He asked if we would notify the patient.  Thanks

## 2023-04-08 ENCOUNTER — Ambulatory Visit (INDEPENDENT_AMBULATORY_CARE_PROVIDER_SITE_OTHER): Payer: Medicare PPO | Admitting: Podiatry

## 2023-04-08 ENCOUNTER — Encounter: Payer: Self-pay | Admitting: Hematology and Oncology

## 2023-04-08 ENCOUNTER — Encounter: Payer: Self-pay | Admitting: Podiatry

## 2023-04-08 DIAGNOSIS — C4371 Malignant melanoma of right lower limb, including hip: Secondary | ICD-10-CM | POA: Diagnosis not present

## 2023-04-08 NOTE — Progress Notes (Signed)
Chief Complaint  Patient presents with   Routine Post Op    S/p right 5th toe excisional biopsy 11/6. Pain well controlled, here to discuss pathology results.   HPI: 72 y.o. female presents today with her husband and granddaughter (Theatre stage manager), to review the pathology results from her right 5th toe biopsy.  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  She is currently taking Bactrim DS BID for the infection until she can be seen by Infectious Disease for further management.  She feels the toe doesn't look good and is concerned.    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   Cancer (HCC)    Depression    Diabetes mellitus without complication (HCC)    Hyperlipidemia    Hypomagnesemia 08/10/2020   Pulmonary nodules 01/23/2022   Sleep apnea     Past Surgical History:  Procedure Laterality Date   ABDOMINAL HYSTERECTOMY     BREAST RECONSTRUCTION WITH PLACEMENT OF TISSUE EXPANDER AND FLEX HD (ACELLULAR HYDRATED DERMIS) Bilateral 12/11/2020   Procedure: BILATERAL BREAST RECONSTRUCTION WITH PLACEMENT OF TISSUE EXPANDER AND FLEX HD (ACELLULAR HYDRATED DERMIS);  Surgeon: Allena Napoleon, MD;  Location: [facility];  Service: Plastics;  Laterality: Bilateral;   BREAST SURGERY Left    CATARACT EXTRACTION Left 09/25/2022   HYSTERECTOMY ABDOMINAL WITH SALPINGO-OOPHORECTOMY  07/2003   due to fibroids   NIPPLE SPARING MASTECTOMY Bilateral 12/11/2020   Procedure: BILATERAL NIPPLE SPARING MASTECTOMY;  Surgeon: Emelia Loron, MD;  Location: [facility];  Service: General;  Laterality: Bilateral;   PORTACATH PLACEMENT Right 06/26/2020   Procedure: INSERTION PORT-A-CATH;  Surgeon: Emelia Loron, MD;  Location: [facility];  Service: General;  Laterality: Right;   REMOVAL OF BILATERAL TISSUE EXPANDERS WITH  PLACEMENT OF BILATERAL BREAST IMPLANTS Bilateral 04/30/2021   Procedure: REMOVAL OF BILATERAL TISSUE EXPANDERS WITH PLACEMENT OF BILATERAL BREAST IMPLANTS;  Surgeon: Allena Napoleon, MD;  Location: [facility];  Service: Plastics;  Laterality: Bilateral;   TRIGGER FINGER RELEASE      Allergies  Allergen Reactions   Oxycodone Itching    Other reaction(s): Itching   Codeine Other (See Comments)    Other reaction(s): Other (See Comments) Unknown Unknown  Other reaction(s): Other (See Comments), Other (See Comments), Other (See Comments) Unknown Unknown Other reaction(s): Other (See Comments) Unknown Unknown Other reaction(s): Other (See Comments), Other (See Comments), Other (See Comments) Unknown Unknown Other reaction(s): Other (See Comments) Unknown Unknown  Other reaction(s): Other (See Comments) Unknown Unknown  Other reaction(s): Other (See Comments), Other (See Comments), Other (See Comments) Unknown Unknown Other reaction(s): Other (See Comments) Unknown Unknown Other reaction(s): Other (See Comments), Other (See Comments), Other (See Comments) Unknown Unknown Other reaction(s): Other (See Comments) Unknown Unknown  Other reaction(s): Other (See Comments) Unknown Unknown  Other reaction(s): Other (See Comments), Other (See Comments), Other (See Comments) Unknown Unknown Other reaction(s): Other (See Comments) Unknown Unknown Other reaction(s): Other (See Comments) Unknown Unknown  Other reaction(s): Other (See Comments), Other (See Comments), Other (See Comments) Unknown Unknown Other reaction(s): Other (See Comments) Unknown Unknown Other reaction(s): Other (See Comments), Other (See Comments), Other (See Comments) Unknown Unknown Other reaction(s): Other (See Comments) Unknown Unknown  Other reaction(s): Other (See Comments) Unknown Unknown  Other reaction(s): Other (See Comments), Other (See Comments), Other (See  Comments) Unknown Unknown Other reaction(s): Other (See Comments) Unknown  Unknown   Lisinopril Cough    Other reaction(s): Cough (ALLERGY/intolerance) Other reaction(s): Cough Other reaction(s): Cough (ALLERGY/intolerance) Other reaction(s): Cough (ALLERGY/intolerance)  Other reaction(s): Cough (ALLERGY/intolerance) Other reaction(s): Cough Other reaction(s): Cough (ALLERGY/intolerance)    Physical Exam: Palpable pedal pulses right foot. Sutures intact.  No dehiscence or gangrenous changes.  No active drainage.  Pain on palpation of surgical area.  Betadine staining on the skin seen (from O.R. prep) and ecchymosis noted on toe.  Toe is of normal temperature.      Assessment/Plan of Care: 1. 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  Significant time was taken prior to patient appointment today to contact the pathologist to discuss his findings in more detail, contact [facility] and [facility] to get her evaluated as soon as possible to see if this is her primary melanoma location vs satellite/metastasis location.  Reports were faxed to both facilities for oncologist review.  She never had a pigmented lesion on this toe.  It was explained to the patient that there is also "amelanotic melanoma" which will not have pigment/mole that would typically look abnormal.  She'll need CT / PET scans performed to evaluate for metastasis and possibly sentinel node biopsy to stage the melanoma.  She may need referral to orthopedic oncology for thorough resection of melanoma.  Patient informed the toe may need amputation soon.    Patient to continue with sterile dressing on right 5th toe.  Continue with surgical shoe.  Sutures were not removed today so as not to disturb site since swelling/mass could quickly return and open incision area.  Informed patient we  can remove the sutures up to 4 weeks p/o to give oncology time to perform any necessary testing.  Patient was able to be referred to [facility] at the [facility] location for tomorrow at 11am.  She is a breast cancer survivor and had last chest CT scan in May 2024.    Patient's husband walked out of the exam room once the diagnosis was revealed.  After counseling the patient, I did go to the parking lot to speak to her husband and answer any questions or concerns he may have.    Will reschedule patient for p/o care once hearing back from oncology.    Clerance Lav, DPM, FACFAS [facility]     [address] [place], [place] [address]                Office [phone]  Fax [phone]

## 2023-04-09 ENCOUNTER — Encounter: Payer: Self-pay | Admitting: Podiatry

## 2023-04-09 ENCOUNTER — Encounter: Payer: Medicare PPO | Admitting: Podiatry

## 2023-04-09 ENCOUNTER — Inpatient Hospital Stay (HOSPITAL_BASED_OUTPATIENT_CLINIC_OR_DEPARTMENT_OTHER): Payer: Medicare PPO | Admitting: Oncology

## 2023-04-09 VITALS — BP 124/69 | HR 97 | Temp 99.5°F | Resp 14 | Ht 62.0 in | Wt 140.7 lb

## 2023-04-09 DIAGNOSIS — C50912 Malignant neoplasm of unspecified site of left female breast: Secondary | ICD-10-CM

## 2023-04-09 DIAGNOSIS — C50512 Malignant neoplasm of lower-outer quadrant of left female breast: Secondary | ICD-10-CM | POA: Diagnosis not present

## 2023-04-09 DIAGNOSIS — C4371 Malignant melanoma of right lower limb, including hip: Secondary | ICD-10-CM | POA: Diagnosis not present

## 2023-04-09 DIAGNOSIS — M869 Osteomyelitis, unspecified: Secondary | ICD-10-CM | POA: Insufficient documentation

## 2023-04-09 DIAGNOSIS — C50919 Malignant neoplasm of unspecified site of unspecified female breast: Secondary | ICD-10-CM

## 2023-04-09 NOTE — Progress Notes (Signed)
[facility] Cancer Follow up Visit:  Patient Care Team: Judge Stall, MD as PCP - General (General Practice) Dellia Beckwith, MD as Consulting Physician (Oncology) Izell Mantador, MD as Referring Physician (Endocrinology) Thomasene Ripple, DO as Consulting Physician (Cardiology) Emelia Loron, MD as Consulting Physician (General Surgery) Allena Napoleon, MD as Consulting Physician (Plastic Surgery)  CHIEF COMPLAINTS/PURPOSE OF CONSULTATION:  Oncology History  Malignant neoplasm of lower-outer quadrant of left breast of female, estrogen receptor positive (HCC)  11/03/2002 Initial Diagnosis   Breast cancer in female [facility])   11/03/2002 Cancer Staging   Staging form: Breast, AJCC 6th Edition - Clinical stage from 11/03/2002: Stage IIIA (T2, N2, M0) - Signed by Dellia Beckwith, MD on 06/04/2020 Staging comments: Treated with AC x4, Taxol x4, anastrazole x 10 years, completed April 2015 Prognostic indicators: ER/PR pos, HER2 neg, age 89 at dx, had re-excision for pos margin   Local recurrence of cancer of left breast (HCC)  06/08/2020 Initial Diagnosis   Local recurrence of cancer of right breast (HCC)   06/20/2020 Cancer Staging   Staging form: Breast, AJCC 8th Edition - Clinical stage from 06/20/2020: Stage IIA (rcT2, cN0, cM0, G3, ER+, PR-, HER2+) - Signed by Dellia Beckwith, MD on 06/27/2020 Histopathologic type: Carcinoma, NOS Stage prefix: Recurrence Method of lymph node assessment: Clinical Nuclear grade: G3 Multigene prognostic tests performed: None Menopausal status: Postmenopausal Ki-67 (%): 25 Stage used in treatment planning: Yes National guidelines used in treatment planning: Yes Type of national guideline used in treatment planning: NCCN Staging comments: 3.2 total size, incl satellite lesion.  Arising in adipose tissue so susp for recurrence but now HER @ positive   07/03/2020 - 10/22/2020 Chemotherapy    Patient is on Treatment Plan: BREAST  TRASTUZUMAB + PERTUZUMAB Q21D X 11 CYCLES      11/08/2020 - 10/15/2021 Chemotherapy   Patient is on Treatment Plan : BREAST Trastuzumab + Pertuzumab q21d x 11 cycles     12/13/2020 Cancer Staging   Staging form: Breast, AJCC 8th Edition - Pathologic stage from 12/13/2020: No Stage Recommended (ypT1c, pN0, cM0, G2, ER+, PR-, HER2+) - Signed by Dellia Beckwith, MD on 01/10/2021 Histopathologic type: Carcinoma, NOS Stage prefix: Post-therapy Response to neoadjuvant therapy: Partial response Method of lymph node assessment: Clinical Nuclear grade: G2 Multigene prognostic tests performed: None Histologic grading system: 3 grade system Residual tumor (R): R0 - None Laterality: Left Tumor size (mm): 15 Lymph-vascular invasion (LVI): LVI not present (absent)/not identified Diagnostic confirmation: Positive histology Specimen type: Excision Staged by: Managing physician Menopausal status: Postmenopausal Ki-67 (%): 25 Stage used in treatment planning: Yes National guidelines used in treatment planning: Yes Type of national guideline used in treatment planning: NCCN   Hypomagnesemia    HISTORY OF PRESENTING ILLNESS: Tammy Fowler 72 y.o. female is here because of  malignant melanoma and history of breast cancer  December 2021: Presents with a palpable nodule in upper inner quadrant left breast January 2022: Biopsy reveals poorly differentiated carcinoma, ER positive, PR negative, HER2/neu positive felt to represent a recurrence of breast cancer rather than a second primary. July 03, 2020 through October 17, 2020 received 6 cycles of neoadjuvant Taxol/Carbo/Perjeta/trastuzumab with dose reductions required for Taxotere and cycles 5 and 22 December 2020: Bilateral mastectomy and immediate reconstruction Oct 15 2021 completed 10 cycles of adjuvant Herceptin.  Patient was also placed on letrozole.  Course was complicated by development of AKI for which she required temporary dialysis.  This  was thought  to be due to rhabdomyolysis, NSAIDs, diabetes medications and a statin.  February 13, 2023: Presents to podiatry with swelling and pain of right fifth toe which began 6 months prior.  She was treated initially for cellulitis and abscess.  Radiographs showed slight increase in soft tissue density and volume of right fifth toe distal aspect  February 24, 2023: Follow-up podiatry visit  March 04, 2023: MRI right toes--avidly enhancing soft tissue masses of fifth toe 1 of which is centered in the subungual region and adjacent mass extends to medial soft tissues of fifth toe.  Bony erosion of the underlying distal phalanx concerning for neoplasm.  March 11, 2023: Follow-up podiatry visit.  Patient notes toe is increasing in size.    March 24 2023:  Biopsy of right 5th toe under anesthesia   April 02, 2023: Postoperative visit with podiatry.  Intraoperative soft tissue and bone cultures revealed Enterobacter cloaca and group B strep.  Patient was placed on Bactrim DS and referred to ID  April 09 2023:   Reviewed results of history with patient and husband.   Daughter and granddaughter have been tested.  Daughter has an ATM gene.    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   Cancer (HCC)    Depression    Diabetes mellitus without complication (HCC)    Hyperlipidemia    Hypomagnesemia 08/10/2020   Pulmonary nodules 01/23/2022   Sleep apnea     SURGICAL HISTORY: Past Surgical History:  Procedure Laterality Date   ABDOMINAL HYSTERECTOMY     BREAST RECONSTRUCTION WITH PLACEMENT OF TISSUE EXPANDER AND FLEX HD (ACELLULAR HYDRATED DERMIS) Bilateral 12/11/2020   Procedure: BILATERAL BREAST RECONSTRUCTION WITH PLACEMENT OF TISSUE EXPANDER AND FLEX HD (ACELLULAR HYDRATED DERMIS);  Surgeon: Allena Napoleon, MD;  Location: [facility];  Service: Plastics;  Laterality:  Bilateral;   BREAST SURGERY Left    CATARACT EXTRACTION Left 09/25/2022   HYSTERECTOMY ABDOMINAL WITH SALPINGO-OOPHORECTOMY  07/2003   due to fibroids   NIPPLE SPARING MASTECTOMY Bilateral 12/11/2020   Procedure: BILATERAL NIPPLE SPARING MASTECTOMY;  Surgeon: Emelia Loron, MD;  Location: [facility];  Service: General;  Laterality: Bilateral;   PORTACATH PLACEMENT Right 06/26/2020   Procedure: INSERTION PORT-A-CATH;  Surgeon: Emelia Loron, MD;  Location: [facility];  Service: General;  Laterality: Right;   REMOVAL OF BILATERAL TISSUE EXPANDERS WITH PLACEMENT OF BILATERAL BREAST IMPLANTS Bilateral 04/30/2021   Procedure: REMOVAL OF BILATERAL TISSUE EXPANDERS WITH PLACEMENT OF BILATERAL BREAST IMPLANTS;  Surgeon: Allena Napoleon, MD;  Location: [facility];  Service: Plastics;  Laterality: Bilateral;   TRIGGER FINGER RELEASE      SOCIAL HISTORY: Social History   Socioeconomic History   Marital status: Married    Spouse name: Not on file   Number of children: 2   Years of education: Not on file   Highest education level: Not on file  Occupational History   Not on file  Tobacco Use   Smoking status: Never   Smokeless tobacco: Never  Substance and Sexual Activity   Alcohol use: Yes    Comment: wine   Drug use: Never   Sexual activity: Not on file  Other Topics Concern   Not on file  Social History Narrative   Not on file   Social Drivers of Health   Financial Resource Strain: Low Risk  (06/24/2022)   Received from [facility] [place], [facility] [employer]   Overall [employer] (CARDIA)    Difficulty of Paying Living Expenses: Not hard at all  Food Insecurity: No Food Insecurity (06/24/2022)   Received from [place] [facility] the [place], [facility] [employer] Vital Sign    Worried About Programme researcher, broadcasting/film/video in the Last Year: Never true    Ran Out of Food in the Last Year: Never  true  Transportation Needs: No Transportation Needs (06/24/2022)   Received from [place] [facility] the [place], [facility] the [employer] [facility] [employer] (Medical): No    Lack of Transportation (Non-Medical): No  Physical Activity: Not on file  Stress: Stress Concern Present (11/05/2021)   Received from [facility], [facility] of Occupational [facility] - Occupational Stress Questionnaire    Feeling of Stress : To some extent  Social Connections: Unknown (09/30/2021)   Received from [facility], [facility]   Social Network    Social Network: Not on file  Intimate Partner Violence: Not At Risk (06/24/2022)   Received from [place] [facility] the [place], [facility] [employer], Afraid, Rape, and Kick questionnaire    Fear of Current or Ex-Partner: No    Emotionally Abused: No    Physically Abused: No    Sexually Abused: No    FAMILY HISTORY Family History  Problem Relation Age of Onset   Breast cancer Mother 41   Breast cancer Maternal Aunt 25   Ovarian cancer Maternal Grandmother 8   Bone cancer Maternal Grandfather        80s   Cancer Maternal Aunt 42    ALLERGIES:  is allergic to oxycodone, codeine, and lisinopril.  MEDICATIONS:  Current Outpatient Medications  Medication Sig Dispense Refill   acetaminophen (TYLENOL) 650 MG CR tablet Take 650 mg by mouth every 8 (eight) hours as needed for pain.     aspirin 81 MG chewable tablet Chew by mouth.     CALCIUM CITRATE-VITAMIN D PO Take 2 tablets by mouth daily at 12 noon. 500mg  of calcium with vitamin D     DULoxetine (CYMBALTA) 30 MG capsule Take 30 mg by mouth daily.     gabapentin (NEURONTIN) 300 MG capsule Take 1 capsule (300 mg total) by mouth 3 (three) times daily. 2 tablets in the morning and 1 tablet at night (Patient taking differently: Take 300 mg by mouth 3 (three) times daily. 1tablet in AM, 2 tabs at night) 90 capsule 3   JARDIANCE 25  MG TABS tablet Take 25 mg by mouth daily.     letrozole (FEMARA) 2.5 MG tablet TAKE 1 TABLET BY MOUTH EVERY DAY 90 tablet 3   losartan (COZAAR) 50 MG tablet daily.     magnesium oxide (MAG-OX) 400 (241.3 Mg) MG tablet Take 1 tablet (400 mg total) by mouth 2 (two) times daily. 60 tablet 1   pravastatin (PRAVACHOL) 20 MG tablet Take 20 mg by mouth daily.     rOPINIRole (REQUIP) 0.5 MG tablet Take by mouth.     Docusate Sodium (DSS) 100 MG CAPS Take by mouth. (Patient not taking: Reported on 04/07/2023)     sulfamethoxazole-trimethoprim (BACTRIM DS) 800-160 MG tablet TAKE 1 TABLET BY MOUTH TWICE A DAY FOR 20 DAYS 40 tablet 0   zolpidem (AMBIEN) 10 MG tablet Take 1 tablet (10 mg total) by mouth at bedtime as needed for sleep. 30 tablet 0  No current facility-administered medications for this visit.    PHYSICAL EXAMINATION:  ECOG PERFORMANCE STATUS: 0 - Asymptomatic   Vitals:   04/09/23 1110  BP: 124/69  Pulse: 97  Resp: 14  Temp: 99.5 F (37.5 C)  SpO2: 97%    Filed Weights   04/09/23 1110  Weight: 140 lb 11.2 oz (63.8 kg)     Physical Exam Vitals and nursing note reviewed.  Constitutional:      General: She is not in acute distress.    Appearance: Normal appearance. She is normal weight. She is not ill-appearing, toxic-appearing or diaphoretic.  HENT:     Head: Normocephalic and atraumatic.     Right Ear: External ear normal.     Left Ear: External ear normal.     Nose: Nose normal. No congestion or rhinorrhea.  Eyes:     General: No scleral icterus.    Extraocular Movements: Extraocular movements intact.     Conjunctiva/sclera: Conjunctivae normal.     Pupils: Pupils are equal, round, and reactive to light.  Cardiovascular:     Rate and Rhythm: Normal rate.     Heart sounds: No murmur heard.    No friction rub. No gallop.  Pulmonary:     Effort: Pulmonary effort is normal. No respiratory distress.     Breath sounds: Normal breath sounds. No stridor. No wheezing  or rhonchi.  Abdominal:     General: Bowel sounds are normal.     Palpations: Abdomen is soft.     Tenderness: There is no abdominal tenderness. There is no guarding or rebound.     Hernia: No hernia is present.  Musculoskeletal:        General: No swelling, tenderness or deformity. Normal range of motion.     Cervical back: Normal range of motion and neck supple. No rigidity or tenderness.  Lymphadenopathy:     Head:     Right side of head: No submental, submandibular, tonsillar, preauricular, posterior auricular or occipital adenopathy.     Left side of head: No submental, submandibular, tonsillar, preauricular, posterior auricular or occipital adenopathy.     Cervical: No cervical adenopathy.     Right cervical: No superficial, deep or posterior cervical adenopathy.    Left cervical: No superficial, deep or posterior cervical adenopathy.     Upper Body:     Right upper body: No supraclavicular, axillary, pectoral or epitrochlear adenopathy.     Left upper body: No supraclavicular, axillary, pectoral or epitrochlear adenopathy.  Skin:    General: Skin is warm.     Coloration: Skin is not jaundiced or pale.     Comments: Right foot bandaged  Neurological:     General: No focal deficit present.     Mental Status: She is alert and oriented to person, place, and time.     Cranial Nerves: No cranial nerve deficit.  Psychiatric:        Mood and Affect: Mood normal.        Behavior: Behavior normal.        Thought Content: Thought content normal.        Judgment: Judgment normal.      LABORATORY DATA: I have personally reviewed the data as listed:  Appointment on 04/07/2023  Component Date Value Ref Range Status   Sodium 04/07/2023 137  135 - 145 mmol/L Final   Potassium 04/07/2023 4.7  3.5 - 5.1 mmol/L Final   Chloride 04/07/2023 102  98 - 111 mmol/L Final  CO2 04/07/2023 22  22 - 32 mmol/L Final   Glucose, Bld 04/07/2023 91  70 - 99 mg/dL Final   Glucose reference range  applies only to samples taken after fasting for at least 8 hours.   BUN 04/07/2023 23  8 - 23 mg/dL Final   Creatinine 16/02/9603 1.03 (H)  0.44 - 1.00 mg/dL Final   Calcium 54/01/8118 10.5 (H)  8.9 - 10.3 mg/dL Final   Total Protein 14/78/2956 7.2  6.5 - 8.1 g/dL Final   Albumin 21/30/8657 4.8  3.5 - 5.0 g/dL Final   AST 84/69/6295 20  15 - 41 U/L Final   ALT 04/07/2023 16  0 - 44 U/L Final   Alkaline Phosphatase 04/07/2023 72  38 - 126 U/L Final   Total Bilirubin 04/07/2023 0.4  <1.2 mg/dL Final   GFR, Estimated 04/07/2023 58 (L)  >60 mL/min Final   Comment: (NOTE) Calculated using the CKD-EPI Creatinine Equation (2021)    Anion gap 04/07/2023 13  5 - 15 Final   Performed at [facility], [address] [place], [place], [address]   WBC Count 04/07/2023 5.4  4.0 - 10.5 K/uL Final   RBC 04/07/2023 3.62 (L)  3.87 - 5.11 MIL/uL Final   Hemoglobin 04/07/2023 11.8 (L)  12.0 - 15.0 g/dL Final   HCT 24/40/1027 35.3 (L)  36.0 - 46.0 % Final   MCV 04/07/2023 97.5  80.0 - 100.0 fL Final   MCH 04/07/2023 32.6  26.0 - 34.0 pg Final   MCHC 04/07/2023 33.4  30.0 - 36.0 g/dL Final   RDW 25/36/6440 12.7  11.5 - 15.5 % Final   Platelet Count 04/07/2023 308  150 - 400 K/uL Final   nRBC 04/07/2023 0.00  0.0 - 0.2 % Final   Neutrophils Relative % 04/07/2023 62  % Final   Neutro Abs 04/07/2023 3.3  1.7 - 7.7 K/uL Final   Lymphocytes Relative 04/07/2023 27  % Final   Lymphs Abs 04/07/2023 1.5  0.7 - 4.0 K/uL Final   Monocytes Relative 04/07/2023 9  % Final   Monocytes Absolute 04/07/2023 0.5  0.1 - 1.0 K/uL Final   Eosinophils Relative 04/07/2023 1  % Final   Eosinophils Absolute 04/07/2023 0.1  0.0 - 0.5 K/uL Final   Basophils Relative 04/07/2023 1  % Final   Basophils Absolute 04/07/2023 0.1  0.0 - 0.1 K/uL Final   Immature Granulocytes 04/07/2023 0  % Final   Abs Immature Granulocytes 04/07/2023 0.02  0.00 - 0.07 K/uL Final   nRBC 04/07/2023 0  0 /100 WBC Final    Performed at [facility], [address], [place], [place], [address]    RADIOGRAPHIC STUDIES: I have personally reviewed the radiological images as listed and agree with the findings in the report  No results found.  ASSESSMENT/PLAN  Malignant melanoma February 13, 2023: Presents to podiatry with swelling and pain of right fifth toe which began 6 months prior.  She was treated initially for cellulitis and abscess.  Radiographs showed slight increase in soft tissue density and volume of right fifth toe distal aspect   February 24, 2023: Follow-up podiatry visit   March 04, 2023: MRI right toes--avidly enhancing soft tissue masses of fifth toe 1 of which is centered in the subungual region and adjacent mass extends to medial soft tissues of fifth toe.  Bony erosion of the underlying distal phalanx concerning for neoplasm.   March 11, 2023: Follow-up podiatry  visit.  Patient notes toe is increasing in size.     March 24 2023:  Biopsy of right 5th toe under anesthesia   Pathology demonstrated malignant melanoma   April 02, 2023: Postoperative visit with podiatry.  Intraoperative soft tissue and bone cultures revealed Enterobacter cloaca and group B strep.  Patient was placed on Bactrim DS and referred to ID  Therapeutics acral malignant melanoma April 09 2023- Patient has been incompletely staged but at minium has T4b disease.  Will need staging with whole body PET/CT, surgery for amputation of affected toe and consideration for SLNB.  Per patient request, referring to Dr. Whitney Post at [facility] for evaluation and surgical management.  I have personally contacted her and she has graciously expedited evaluation.  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  Felt to represent recurrent breast cancer rather than a second primary. July 03, 2020 through October 17, 2020- 6 cycles of neoadjuvant Taxol/Carbo/Perjeta/trastuzumab with dose reductions of Taxotere for Cycles 5 and 22 December 2020: Bilateral mastectomy and immediate reconstruction Oct 15 2021 completed 10 cycles of adjuvant Herceptin.  Patient placed on letrozole.  Developed AKI for which she required temporary dialysis.  This was thought to be due to rhabdomyolysis, NSAIDs, diabetes medications and a statin.  ATM Gene mutation  Places patient at increased risk for lymphoma, leukemia, stomach cancer, brain cancer, ovarian cancer, breast cancer, skin cancer, liver cancer, and cancer of the larynx  To consider screening for GI esp pancreatic cancers in the future    Cancer Staging  Local recurrence of cancer of left breast (HCC) Staging form: Breast, AJCC 8th Edition - Clinical stage from 06/20/2020: Stage IIA (rcT2, cN0, cM0, G3, ER+, PR-, HER2+) - Signed by Dellia Beckwith, MD on 06/27/2020 Histopathologic type: Carcinoma, NOS Stage prefix: Recurrence Method of lymph node assessment: Clinical Nuclear grade: G3 Multigene prognostic tests performed: None Menopausal status: Postmenopausal Ki-67 (%): 25 Stage used in treatment planning: Yes National guidelines used in treatment planning: Yes Type of national guideline used in treatment planning: NCCN Staging comments: 3.2 total size, incl satellite lesion.  Arising in adipose tissue so susp for recurrence but now HER @ positive - Pathologic stage from 12/13/2020: No Stage Recommended (ypT1c, pN0, cM0, G2, ER+, PR-, HER2+) - Signed by Dellia Beckwith, MD on 01/10/2021 Histopathologic type: Carcinoma, NOS Stage prefix: Post-therapy Response to neoadjuvant therapy: Partial response Method of lymph node assessment:  Clinical Nuclear grade: G2 Multigene prognostic tests performed: None Histologic grading system: 3 grade system Residual tumor (R): R0 - None Laterality: Left Tumor size (mm): 15 Lymph-vascular invasion (LVI): LVI not present (absent)/not identified Diagnostic confirmation: Positive histology Specimen type: Excision Staged by: Managing physician Menopausal status: Postmenopausal Ki-67 (%): 25 Stage used in treatment planning: Yes National guidelines used in treatment planning: Yes Type of national guideline used in treatment planning: NCCN  Malignant neoplasm of lower-outer quadrant of left breast of female, estrogen receptor positive (HCC) Staging form: Breast, AJCC 6th Edition - Clinical stage from 11/03/2002: Stage IIIA (T2, N2, M0) - Signed by Dellia Beckwith, MD on 06/04/2020 Staged by: Managing physician Diagnostic confirmation: Positive histology Specimen type: Excision Histopathologic type: Infiltrating duct carcinoma, NOS  Tumor size (mm): 20 Laterality: Left Total nodes examined: 16 Total positive nodes: 4 Histologic grade (G): G2 Residual tumor (R): R1 - Microscopic Stage prefix: Initial diagnosis Lymphatic vessel invasion (L): L0 - No lymphatic vessel invasion Venous invasion (V): V0 - No venous invasion Staging comments: Treated with AC x4, Taxol x4, anastrazole x 10 years, completed April 2015 Prognostic indicators: ER/PR pos, HER2 neg, age 65 at dx, had re-excision for pos margin    No problem-specific Assessment & Plan notes found for this encounter.    No orders of the defined types were placed in this encounter.   60  minutes was spent in patient care.  This included time spent preparing to see the patient (e.g., review of tests), obtaining and/or reviewing separately obtained history, counseling and educating the patient/family/caregiver, ordering medications, tests, or procedures; documenting clinical information in the electronic or other health  record, independently interpreting results and communicating results to the patient/family/caregiver as well as coordination of care.       All questions were answered. The patient knows to call the clinic with any problems, questions or concerns.  This note was electronically signed.    Loni Muse, MD  05/06/2023 11:00 AM

## 2023-04-13 ENCOUNTER — Ambulatory Visit: Payer: Medicare PPO | Admitting: Infectious Disease

## 2023-04-20 ENCOUNTER — Telehealth: Payer: Self-pay | Admitting: Podiatry

## 2023-04-20 ENCOUNTER — Other Ambulatory Visit: Payer: Self-pay | Admitting: Podiatry

## 2023-04-20 ENCOUNTER — Ambulatory Visit: Payer: Medicare PPO | Admitting: Infectious Disease

## 2023-04-20 NOTE — Telephone Encounter (Signed)
Pt cxled pov # 3 states she has been sent to [facility] and will call if needed to reschedule.

## 2023-04-22 ENCOUNTER — Encounter: Payer: Medicare PPO | Admitting: Podiatry

## 2023-04-23 ENCOUNTER — Encounter: Payer: Medicare PPO | Admitting: Podiatry

## 2023-07-13 ENCOUNTER — Other Ambulatory Visit: Payer: Self-pay | Admitting: Podiatry

## 2023-07-17 ENCOUNTER — Other Ambulatory Visit: Payer: Self-pay | Admitting: Podiatry

## 2023-08-11 ENCOUNTER — Telehealth: Payer: Self-pay | Admitting: Hematology and Oncology

## 2023-08-11 NOTE — Telephone Encounter (Signed)
 Contacted pt to schedule an appt. Unable to reach via phone, voicemail was left.    Scheduling Message Entered by Belva Crome A on 06/09/2023 at  9:02 AM Priority: Routine <No visit type provided>  Department: [facility]  Provider:  Scheduling Notes:  Please schedule labs and f/u with Dr. Shyrl Numbers. Can be done after she finishes radiation at [facility].

## 2023-10-06 ENCOUNTER — Ambulatory Visit: Payer: Medicare PPO | Admitting: Oncology

## 2023-10-06 ENCOUNTER — Other Ambulatory Visit: Payer: Medicare PPO

## 2023-10-22 ENCOUNTER — Other Ambulatory Visit: Payer: Self-pay | Admitting: Podiatry

## 2024-01-04 NOTE — Telephone Encounter (Signed)
 error
